# Patient Record
Sex: Male | Born: 1980 | Race: White | Hispanic: No | Marital: Married | Smoking: Never smoker
Health system: Southern US, Community
[De-identification: ages and names within clinical notes are randomized; demographics above are authoritative.]

## PROBLEM LIST (undated history)

## (undated) DIAGNOSIS — E559 Vitamin D deficiency, unspecified: Secondary | ICD-10-CM

## (undated) DIAGNOSIS — M2550 Pain in unspecified joint: Secondary | ICD-10-CM

## (undated) DIAGNOSIS — G4730 Sleep apnea, unspecified: Secondary | ICD-10-CM

## (undated) DIAGNOSIS — R5383 Other fatigue: Secondary | ICD-10-CM

## (undated) DIAGNOSIS — J45909 Unspecified asthma, uncomplicated: Secondary | ICD-10-CM

## (undated) DIAGNOSIS — R7303 Prediabetes: Secondary | ICD-10-CM

## (undated) DIAGNOSIS — F909 Attention-deficit hyperactivity disorder, unspecified type: Secondary | ICD-10-CM

## (undated) DIAGNOSIS — R12 Heartburn: Secondary | ICD-10-CM

## (undated) DIAGNOSIS — E739 Lactose intolerance, unspecified: Secondary | ICD-10-CM

## (undated) DIAGNOSIS — K219 Gastro-esophageal reflux disease without esophagitis: Secondary | ICD-10-CM

## (undated) DIAGNOSIS — I10 Essential (primary) hypertension: Secondary | ICD-10-CM

## (undated) DIAGNOSIS — M549 Dorsalgia, unspecified: Secondary | ICD-10-CM

## (undated) DIAGNOSIS — E663 Overweight: Secondary | ICD-10-CM

## (undated) DIAGNOSIS — F32A Depression, unspecified: Secondary | ICD-10-CM

## (undated) HISTORY — DX: Lactose intolerance, unspecified: E73.9

## (undated) HISTORY — DX: Pain in unspecified joint: M25.50

## (undated) HISTORY — DX: Other fatigue: R53.83

## (undated) HISTORY — DX: Unspecified asthma, uncomplicated: J45.909

## (undated) HISTORY — DX: Sleep apnea, unspecified: G47.30

## (undated) HISTORY — DX: Overweight: E66.3

## (undated) HISTORY — DX: Dorsalgia, unspecified: M54.9

## (undated) HISTORY — DX: Heartburn: R12

## (undated) HISTORY — DX: Attention-deficit hyperactivity disorder, unspecified type: F90.9

## (undated) HISTORY — PX: PILONIDAL CYST DRAINAGE: SHX743

## (undated) HISTORY — PX: WISDOM TOOTH EXTRACTION: SHX21

## (undated) HISTORY — DX: Vitamin D deficiency, unspecified: E55.9

## (undated) HISTORY — PX: FOREHEAD RECONSTRUCTION: SHX429

## (undated) HISTORY — DX: Depression, unspecified: F32.A

---
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
## 2007-02-03 ENCOUNTER — Emergency Department (HOSPITAL_COMMUNITY): Admission: EM | Admit: 2007-02-03 | Discharge: 2007-02-03 | Payer: Self-pay | Admitting: Emergency Medicine

## 2007-07-28 ENCOUNTER — Ambulatory Visit: Payer: Self-pay | Admitting: Internal Medicine

## 2007-07-28 DIAGNOSIS — I10 Essential (primary) hypertension: Secondary | ICD-10-CM | POA: Insufficient documentation

## 2007-08-01 ENCOUNTER — Telehealth: Payer: Self-pay | Admitting: Internal Medicine

## 2007-08-02 LAB — CONVERTED CEMR LAB
BUN: 10 mg/dL (ref 6–23)
CO2: 29 meq/L (ref 19–32)
Calcium: 9.3 mg/dL (ref 8.4–10.5)
Chloride: 108 meq/L (ref 96–112)
Cholesterol: 139 mg/dL (ref 0–200)
Creatinine, Ser: 0.9 mg/dL (ref 0.4–1.5)
Direct LDL: 85.4 mg/dL
GFR calc Af Amer: 131 mL/min
GFR calc non Af Amer: 108 mL/min
Glucose, Bld: 94 mg/dL (ref 70–99)
HDL: 20.9 mg/dL — ABNORMAL LOW (ref >39.0)
Potassium: 4 meq/L (ref 3.5–5.1)
Sodium: 137 meq/L (ref 135–145)
Total CHOL/HDL Ratio: 6.7
Triglycerides: 262 mg/dL (ref 0–149)
VLDL: 52 mg/dL — ABNORMAL HIGH (ref 0–40)

## 2007-08-05 ENCOUNTER — Ambulatory Visit: Payer: Self-pay | Admitting: Family Medicine

## 2007-08-05 ENCOUNTER — Telehealth: Payer: Self-pay | Admitting: Family Medicine

## 2007-08-23 ENCOUNTER — Encounter: Payer: Self-pay | Admitting: Internal Medicine

## 2007-08-25 ENCOUNTER — Ambulatory Visit: Payer: Self-pay | Admitting: Internal Medicine

## 2007-08-29 ENCOUNTER — Ambulatory Visit: Payer: Self-pay | Admitting: Internal Medicine

## 2007-08-29 DIAGNOSIS — F900 Attention-deficit hyperactivity disorder, predominantly inattentive type: Secondary | ICD-10-CM | POA: Insufficient documentation

## 2007-08-29 DIAGNOSIS — F909 Attention-deficit hyperactivity disorder, unspecified type: Secondary | ICD-10-CM | POA: Insufficient documentation

## 2007-10-06 ENCOUNTER — Ambulatory Visit: Payer: Self-pay | Admitting: Internal Medicine

## 2007-10-24 ENCOUNTER — Telehealth: Payer: Self-pay | Admitting: Internal Medicine

## 2008-01-11 ENCOUNTER — Ambulatory Visit: Payer: Self-pay | Admitting: Internal Medicine

## 2008-01-11 DIAGNOSIS — K219 Gastro-esophageal reflux disease without esophagitis: Secondary | ICD-10-CM | POA: Insufficient documentation

## 2008-04-10 ENCOUNTER — Ambulatory Visit: Payer: Self-pay | Admitting: Internal Medicine

## 2008-06-18 ENCOUNTER — Telehealth: Payer: Self-pay | Admitting: Internal Medicine

## 2008-08-21 ENCOUNTER — Ambulatory Visit: Payer: Self-pay | Admitting: Internal Medicine

## 2008-08-28 ENCOUNTER — Telehealth: Payer: Self-pay | Admitting: Internal Medicine

## 2008-10-02 ENCOUNTER — Ambulatory Visit: Payer: Self-pay | Admitting: Internal Medicine

## 2008-10-02 LAB — CONVERTED CEMR LAB
ALT: 26 units/L (ref 0–53)
AST: 21 units/L (ref 0–37)
Albumin: 4.2 g/dL (ref 3.5–5.2)
Alkaline Phosphatase: 58 units/L (ref 39–117)
BUN: 12 mg/dL (ref 6–23)
Basophils Absolute: 0 10*3/uL (ref 0.0–0.1)
Basophils Relative: 0.5 % (ref 0.0–3.0)
Bilirubin Urine: NEGATIVE
Bilirubin, Direct: 0 mg/dL (ref 0.0–0.3)
Blood in Urine, dipstick: NEGATIVE
CO2: 30 meq/L (ref 19–32)
Calcium: 9.1 mg/dL (ref 8.4–10.5)
Chloride: 108 meq/L (ref 96–112)
Cholesterol: 150 mg/dL (ref 0–200)
Creatinine, Ser: 0.8 mg/dL (ref 0.4–1.5)
Eosinophils Absolute: 0.1 10*3/uL (ref 0.0–0.7)
Eosinophils Relative: 1.3 % (ref 0.0–5.0)
GFR calc non Af Amer: 123.08 mL/min (ref >60)
Glucose, Bld: 86 mg/dL (ref 70–99)
Glucose, Urine, Semiquant: NEGATIVE
HCT: 43 % (ref 39.0–52.0)
HDL: 27 mg/dL — ABNORMAL LOW (ref >39.00)
Hemoglobin: 15.1 g/dL (ref 13.0–17.0)
Ketones, urine, test strip: NEGATIVE
LDL Cholesterol: 92 mg/dL (ref 0–99)
Lymphocytes Relative: 37 % (ref 12.0–46.0)
Lymphs Abs: 2.1 10*3/uL (ref 0.7–4.0)
MCHC: 35.1 g/dL (ref 30.0–36.0)
MCV: 88.4 fL (ref 78.0–100.0)
Monocytes Absolute: 0.5 10*3/uL (ref 0.1–1.0)
Monocytes Relative: 8.8 % (ref 3.0–12.0)
Neutro Abs: 3.1 10*3/uL (ref 1.4–7.7)
Neutrophils Relative %: 52.4 % (ref 43.0–77.0)
Nitrite: NEGATIVE
Platelets: 218 10*3/uL (ref 150.0–400.0)
Potassium: 4.1 meq/L (ref 3.5–5.1)
Protein, U semiquant: NEGATIVE
RBC: 4.86 M/uL (ref 4.22–5.81)
RDW: 11.6 % (ref 11.5–14.6)
Sodium: 142 meq/L (ref 135–145)
Specific Gravity, Urine: 1.015
TSH: 0.59 microintl units/mL (ref 0.35–5.50)
Total Bilirubin: 1.1 mg/dL (ref 0.3–1.2)
Total CHOL/HDL Ratio: 6
Total Protein: 7.2 g/dL (ref 6.0–8.3)
Triglycerides: 153 mg/dL — ABNORMAL HIGH (ref 0.0–149.0)
Urobilinogen, UA: 0.2
VLDL: 30.6 mg/dL (ref 0.0–40.0)
WBC Urine, dipstick: NEGATIVE
WBC: 5.8 10*3/uL (ref 4.5–10.5)
pH: 7.5

## 2008-10-14 ENCOUNTER — Ambulatory Visit: Payer: Self-pay | Admitting: Internal Medicine

## 2008-11-27 ENCOUNTER — Telehealth: Payer: Self-pay | Admitting: Internal Medicine

## 2008-12-16 ENCOUNTER — Ambulatory Visit: Payer: Self-pay | Admitting: Occupational Medicine

## 2009-04-10 ENCOUNTER — Emergency Department (HOSPITAL_BASED_OUTPATIENT_CLINIC_OR_DEPARTMENT_OTHER): Admission: EM | Admit: 2009-04-10 | Discharge: 2009-04-10 | Payer: Self-pay | Admitting: Emergency Medicine

## 2009-04-15 ENCOUNTER — Encounter: Admission: RE | Admit: 2009-04-15 | Discharge: 2009-05-21 | Payer: Self-pay | Admitting: Orthopedic Surgery

## 2009-11-14 ENCOUNTER — Ambulatory Visit: Payer: Self-pay | Admitting: Internal Medicine

## 2009-11-25 ENCOUNTER — Encounter: Payer: Self-pay | Admitting: Internal Medicine

## 2010-01-01 ENCOUNTER — Encounter: Payer: Self-pay | Admitting: Internal Medicine

## 2010-01-21 ENCOUNTER — Encounter: Payer: Self-pay | Admitting: Internal Medicine

## 2010-04-07 NOTE — Assessment & Plan Note (Signed)
Summary: 3 MONTH ROV/NJRreschedule with patient from bump/mhf   Vital Signs:  Patient Profile:   30 Years Old Male Height:     71.25 inches Weight:      273 pounds Temp:     98.2 degrees F Pulse rate:   58 / minute BP sitting:   108 / 76  (left arm)  Vitals Entered By: Gladis Riffle, RN (January 11, 2008 8:53 AM)                 Chief Complaint:  3 month rov--states strattera working most days.  History of Present Illness: ADD-doing well on meds  HTN---tolerating meds without difficulty  GERD---doing well on ppi  Past Medical History: Hypertension (dx 2009)  Past Surgical History: pilonidal cyst removal of metal fragment from right temple mole removal Social History: Occupation: EMT Single-engaged Current Smoker Alcohol use-yes Regular exercise-no  Family History: Family History Hypertension---father mother- alive and well  no other complaints in a complete ROS     Prior Medication List:  LISINOPRIL 10 MG  TABS (LISINOPRIL) Take 1 tab by mouth daily OMEPRAZOLE 20 MG CPDR (OMEPRAZOLE) one by mouth daily STRATTERA 80 MG  CAPS (ATOMOXETINE HCL) once daily tab   Current Allergies (reviewed today): No known allergies   Past Medical History:    Hypertension (dx 2009)    ADHD    GERD     Review of Systems       no other complaints in a complete ROS    Physical Exam  General:     Well-developed,well-nourished,in no acute distress; alert,appropriate and cooperative throughout examination Head:     normocephalic and atraumatic.   Eyes:     pupils equal and pupils round.   Ears:     R ear normal and L ear normal.   Neck:     No deformities, masses, or tenderness noted. Chest Wall:     No deformities, masses, tenderness or gynecomastia noted. Lungs:     Normal respiratory effort, chest expands symmetrically. Lungs are clear to auscultation, no crackles or wheezes. Heart:     Normal rate and regular rhythm. S1 and S2 normal without gallop, murmur,  click, rub or other extra sounds. Abdomen:     Bowel sounds positive,abdomen soft and non-tender without masses, organomegaly or hernias noted. Msk:     No deformity or scoliosis noted of thoracic or lumbar spine.   Pulses:     R radial normal and L radial normal.      Impression & Recommendations:  Problem # 1:  HYPERTENSION (ICD-401.9) Assessment: Improved  His updated medication list for this problem includes:    Lisinopril 10 Mg Tabs (Lisinopril) .[name]... Take 1 tab by mouth daily  BP today: 108/76 Prior BP: 150/94 (10/06/2007)  Labs Reviewed: Creat: 0.9 (07/28/2007) Chol: 139 (07/28/2007)   HDL: 20.9 (07/28/2007)   LDL: 85.4 (07/28/2007)   TG: 262 (07/28/2007)   Problem # 2:  ADHD (ICD-314.01) continue meds  Problem # 3:  GERD (ICD-530.81)  His updated medication list for this problem includes:    Omeprazole 20 Mg Cpdr (Omeprazole) ..... One by mouth daily   Complete Medication List: 1)  Lisinopril 10 Mg Tabs (Lisinopril) .... Take 1 tab by mouth daily 2)  Omeprazole 20 Mg Cpdr (Omeprazole) .... One by mouth daily 3)  Strattera 100 Mg Caps (Atomoxetine hcl) .... Take 1 tablet by mouth once a day   Patient Instructions: 1)  Please schedule a follow-up appointment in 3  months.   Prescriptions: STRATTERA 100 MG CAPS (ATOMOXETINE HCL) Take 1 tablet by mouth once a day  #30 x 6   Entered and Authorized by:   Birdie Sons MD   Signed by:   Birdie Sons MD on 01/11/2008   Method used:   Print then Give to Patient   RxID:   [id number]  ]

## 2010-04-07 NOTE — Assessment & Plan Note (Signed)
Summary: poison ivy rash/tsc   Vital Signs:  Patient Profile:   30 Years Old Male Height:     71.25 inches Weight:      284 pounds Temp:     97.4 degrees F oral Pulse rate:   61 / minute BP sitting:   120 / 77  (left arm) Cuff size:   large  Vitals Entered By: Liane Comber (Aug 05, 2007 11:37 AM)                 Chief Complaint:  poison ivy rash and pain.  Acute Visit History:      The patient complains of rash.  The rash began 5 days ago.  It is pruritic.  It is described as vessicular and is located on the legs.  Comments: went after frisbee in woods, he is very allergic to pioison ivy.           Current Allergies (reviewed today): No known allergies   Past Medical History:    Reviewed history from 07/28/2007 and no changes required:       Hypertension (dx 2009)   Social History:    Reviewed history from 07/28/2007 and no changes required:       Occupation: EMT       Single-engaged       Current Smoker       Alcohol use-yes       Regular exercise-no    Review of Systems      See HPI  General      interested in weight loss, exercising regularly, watching fat intake  Resp      Denies shortness of breath.      no tounge swelling   Physical Exam  General:     overweight-appearing, NADoverweight-appearing.   Lungs:     Normal respiratory effort, chest expands symmetrically. Lungs are clear to auscultation, no crackles or wheezes. Heart:     Normal rate and regular rhythm. S1 and S2 normal without gallop, murmur, click, rub or other extra sounds. Skin:     weeping, clear yellow fluid, no pus, large vesicles and erythematous plaque B legs    Impression & Recommendations:  Problem # 1:  CONTACT DERMATITIS&OTHER ECZEMA DUE TO PLANTS (ICD-692.6)  His updated medication list for this problem includes:    Prednisone 20 Mg Tabs (Prednisone) .Marland KitchenMarland KitchenMarland KitchenMarland Kitchen 3 tabs by mouth daily x 3 days, then 2 tabs by mouth daily x 2 days then 1 tab by mouth daily x 2  days Discussed avoidance of triggers and symptomatic treatment.  Orders: Depo- Medrol 40mg  (J1030) Admin of Therapeutic Inj  intramuscular or subcutaneous (51761)   Complete Medication List: 1)  Lisinopril 10 Mg Tabs (Lisinopril) .... Take 1 tab by mouth daily 2)  Omeprazole 20 Mg Cpdr (Omeprazole) .... One by mouth daily 3)  Prednisone 20 Mg Tabs (Prednisone) .... 3 tabs by mouth daily x 3 days, then 2 tabs by mouth daily x 2 days then 1 tab by mouth daily x 2 days   Patient Instructions: 1)  May try ALlI for weight loss. Avoid stimulant weight loss meds given high blood pressure.   Prescriptions: PREDNISONE 20 MG  TABS (PREDNISONE) 3 tabs by mouth daily x 3 days, then 2 tabs by mouth daily x 2 days then 1 tab by mouth daily x 2 days  #15 x 0   Entered and Authorized by:   Kerby Nora MD   Signed by:   Kerby Nora MD on 08/05/2007  Method used:   Print then Give to Patient   RxID:   [id number]  ]    Medication Administration  Injection # 1:    Medication: Depo- Medrol 40mg     Diagnosis: CONTACT DERMATITIS&OTHER ECZEMA DUE TO PLANTS (ICD-692.6)    Route: IM    Site: R deltoid    Exp Date: 03/07/2008    Lot #: [id number]    Mfr: Sicor    Comments: IM x 1     Patient tolerated injection without complications    Given by: Liane Comber (Aug 05, 2007 12:05 PM)  Orders Added: 1)  Est. Patient Level III [99213] 2)  Depo- Medrol 40mg  [J1030] 3)  Admin of Therapeutic Inj  intramuscular or subcutaneous [96372]   Medication Administration  Injection # 1:    Medication: Depo- Medrol 40mg     Diagnosis: CONTACT DERMATITIS&OTHER ECZEMA DUE TO PLANTS (ICD-692.6)    Route: IM    Site: R deltoid    Exp Date: 03/07/2008    Lot #: [id number]    Mfr: Sicor    Comments: IM x 1     Patient tolerated injection without complications    Given by: Liane Comber (Aug 05, 2007 12:05 PM)  Orders Added: 1)  Est. Patient Level III [84696] 2)  Depo- Medrol 40mg  [J1030] 3)   Admin of Therapeutic Inj  intramuscular or subcutaneous [[id number]]

## 2010-04-07 NOTE — Progress Notes (Signed)
Summary: increase Strattera  Phone Note Call from Patient   Caller: Patient Call For: Birdie Sons MD Summary of Call: Pt would like to increase his Strattera dose due to difficulty focusing. [phone] Initial call taken by: Lynann Beaver CMA,  November 27, 2008 11:41 AM  Follow-up for Phone Call        he is already on max or near maximum dose Follow-up by: Birdie Sons MD,  November 27, 2008 11:57 AM  Additional Follow-up for Phone Call Additional follow up Details #1::        Patient informed & said okay. Additional Follow-up by: Rudy Jew, RN,  November 27, 2008 2:07 PM

## 2010-04-07 NOTE — Progress Notes (Signed)
Summary: REFILL--lisinopril,omeprazole, strattera  Phone Note Refill Request Message from:  Patient  Refills Requested: Medication #1:  OMEPRAZOLE 20 MG CPDR one by mouth daily  Medication #2:  STRATTERA 60 MG CAPS 2 by mouth once daily.  Medication #3:  LISINOPRIL 10 MG  TABS Take 1 tab by mouth daily [facility]   Initial call taken by: Roselle Locus,  August 28, 2008 4:50 PM  Follow-up for Phone Call        Rx done electronically to [facility] [address].  Pt aware. Follow-up by: Gladis Riffle, RN,  August 29, 2008 12:13 PM      Prescriptions: STRATTERA 60 MG CAPS (ATOMOXETINE HCL) 2 by mouth once daily  #120 x 3   Entered by:   Gladis Riffle, RN   Authorized by:   Birdie Sons MD   Signed by:   Gladis Riffle, RN on 08/29/2008   Method used:   Electronically to        [facility] [id number] (retail)       [address] [place], [place]  [address]       Ph: [phone]       Fax: [phone]   RxID:   [id number]

## 2010-04-07 NOTE — Medication Information (Signed)
Summary: Rx for [employer]  Rx for Strattera/[employer]   Imported By: Maryln Gottron 01/23/2010 10:33:52  _____________________________________________________________________  External Attachment:    Type:   Image     Comment:   External Document

## 2010-04-07 NOTE — Progress Notes (Signed)
Summary: Prostate pain questions  Phone Note Call from Patient Call back at Home Phone [phone]   Caller: Patient Call For: Swords Summary of Call: Pt started on Strattera 80 mg on 6/23.  He had a 1 month F/U and things were going well.  Pt concerned now about prostatitis recently following a BM on Sunday.  Shortly after BM he experienced intense pain in his prostate area.  It was only relieved with sitting or lying down, standing the pain was ongoing.  Monday he had a BM, no pain following.  Following Tuesday BM and the pain occured again same place and he had to sit/lie down for sx to resolve, usually within the hour. Could this be a result of the new med?  Another OV? [facility] [address] Initial call taken by: Sid Falcon LPN,  October 24, 2007 11:44 AM  Follow-up for Phone Call        unlikely to be new medication schedule OV --for this problem ONLY Follow-up by: Birdie Sons MD,  October 24, 2007 2:29 PM  Additional Follow-up for Phone Call Additional follow up Details #1::        Pt informed, he will call back for OV when he has his schedule with him. Additional Follow-up by: Sid Falcon LPN,  October 24, 2007 3:31 PM

## 2010-04-07 NOTE — Medication Information (Signed)
Summary: Rx for [employer] Patient Assistance program  Rx for Strattera/[employer] Patient Assistance program   Imported By: Maryln Gottron 01/05/2010 13:00:27  _____________________________________________________________________  External Attachment:    Type:   Image     Comment:   External Document

## 2010-04-07 NOTE — Assessment & Plan Note (Signed)
Summary: to discuss treatment for ADD   Vital Signs:  Patient Profile:   30 Years Old Male Height:     71.25 inches Weight:      284 pounds Temp:     98.4 degrees F oral BP sitting:   140 / 80  (left arm) Cuff size:   regular  Vitals Entered By: Sid Falcon LPN (August 29, 2007 4:14 PM)                 Chief Complaint:  To discuss ADD.  History of Present Illness: hx ADD brings in records from psychologist with documentation of presumed ADD    Current Allergies (reviewed today): No known allergies         Impression & Recommendations:  Problem # 1:  ADHD (ICD-314.01) discussed meds at length discussed diagnosis at length. Reviewed documentation. Trial Strattera. Gradually increase dose. See instructions. Total time 20 minutes   Complete Medication List: 1)  Lisinopril 10 Mg Tabs (Lisinopril) .... Take 1 tab by mouth daily 2)  Omeprazole 20 Mg Cpdr (Omeprazole) .... One by mouth daily 3)  Strattera 80 Mg Caps (Atomoxetine hcl) .... Once daily tab   Patient Instructions: 1)  straterra 40 mg by mouth once daily for 5 days and then 2 by mouth once daily ---then start 80 mg   Prescriptions: STRATTERA 80 MG  CAPS (ATOMOXETINE HCL) once daily tab  #30 x 11   Entered and Authorized by:   Birdie Sons MD   Signed by:   Birdie Sons MD on 08/29/2007   Method used:   Print then Give to Patient   RxID:   [id number]  ]

## 2010-04-07 NOTE — Assessment & Plan Note (Signed)
Summary: cpx/njr rsc bmp/njr   Vital Signs:  Patient profile:   30 year old male Height:      73.5 inches Weight:      282 pounds BMI:     36.83 Pulse rate:   68 / minute Resp:     12 per minute BP sitting:   116 / 78  (left arm)  Vitals Entered By: Gladis Riffle, RN (October 14, 2008 9:16 AM)  Nutrition Counseling: Patient's BMI is greater than 25 and therefore counseled on weight management options.  History of Present Illness: cpx  Current Problems (verified): 1)  Preventive Health Care  (ICD-V70.0) 2)  Folliculitis  (ICD-704.8) 3)  Gerd  (ICD-530.81) 4)  Special Screening For Malignant Neoplasms Colon  (ICD-V76.51) 5)  Adhd  (ICD-314.01) 6)  Hypertension  (ICD-401.9)  Current Medications (verified): 1)  Lisinopril 10 Mg  Tabs (Lisinopril) .... Take 1 Tab By Mouth Daily 2)  Omeprazole 20 Mg Cpdr (Omeprazole) .... One By Mouth Daily 3)  Strattera 60 Mg Caps (Atomoxetine Hcl) .... 2 By Mouth Once Daily  Allergies: No Known Drug Allergies  Comments:  Nurse/Medical Assistant: cpx, labs done--c/o seasonal sinus pressure and "gall bladder issues"  The patient's medications and allergies were reviewed with the patient and were updated in the Medication and Allergy Lists. Gladis Riffle, RN (October 14, 2008 9:16 AM)  Social History: Occupation: EMT Current Smoker--cigar---none consistently Alcohol use-yes Regular exercise-no Married  Physical Exam  General:  alert and well-developed.   Head:  normocephalic and atraumatic.   Eyes:  pupils equal and pupils round.   Ears:  R ear normal and L ear normal.   Nose:  no external deformity and no external erythema.   Neck:  No deformities, masses, or tenderness noted. Chest Wall:  No deformities, masses, tenderness or gynecomastia noted. Lungs:  normal respiratory effort and no intercostal retractions.   Heart:  normal rate, regular rhythm, no gallop, and no rub.   Abdomen:  Bowel sounds positive,abdomen soft and non-tender  without masses, organomegaly or hernias noted.  overweight Prostate:  Prostate gland firm and smooth, no enlargement, nodularity, tenderness, mass, asymmetry or induration. Msk:  No deformity or scoliosis noted of thoracic or lumbar spine.   Pulses:  R radial normal and L radial normal.   Neurologic:  cranial nerves II-XII intact and gait normal.   Skin:  turgor normal and color normal.  tatoo left thigh Cervical Nodes:  no anterior cervical adenopathy and no posterior cervical adenopathy.   Psych:  normally interactive and good eye contact.     Impression & Recommendations:  Problem # 1:  Preventive Health Care (ICD-V70.0) helath maint utd neds to lose weight initial goal should be under 225 pounds  Problem # 2:  HYPERTENSION (ICD-401.9) well controlled continue current medications  His updated medication list for this problem includes:    Lisinopril 10 Mg Tabs (Lisinopril) .[name]... Take 1 tab by mouth daily  BP today: 116/78 Prior BP: 112/86 (08/21/2008)  Labs Reviewed: K+: 4.1 (10/02/2008) Creat: : 0.8 (10/02/2008)   Chol: 150 (10/02/2008)   HDL: 27.00 (10/02/2008)   LDL: 92 (10/02/2008)   TG: 153.0 (10/02/2008)  Complete Medication List: 1)  Lisinopril 10 Mg Tabs (Lisinopril) .... Take 1 tab by mouth daily 2)  Omeprazole 20 Mg Cpdr (Omeprazole) .... One by mouth daily 3)  Strattera 60 Mg Caps (Atomoxetine hcl) .... 2 by mouth once daily   Patient Instructions: 1)  monitor BP---goal less than 130/85

## 2010-04-07 NOTE — Assessment & Plan Note (Signed)
Summary: 1 month rov/njr   Vital Signs:  Patient Profile:   30 Years Old Male Height:     71.25 inches Weight:      271 pounds Temp:     98.0 degrees F oral Pulse rate:   64 / minute Pulse rhythm:   regular BP sitting:   150 / 94  (left arm) Cuff size:   regular  Vitals Entered By: Kern Reap CMA (October 06, 2007 10:15 AM)                 Serial Vital Signs/Assessments:  Time      Position  BP       Pulse  Resp  Temp     By                     130/84                         Birdie Sons MD   Chief Complaint:  one month follow up with meds.  History of Present Illness: ADD-started strattera---says attention much better. He wonders whether meds causing increase sweating, also feels than fine motor control of mouth is somewhat diminished in the morning.   Past Medical History: Hypertension (dx 2009)  Past Surgical History: pilonidal cyst removal of metal fragment from right temple mole removal Social History: Occupation: EMT Single-engaged Current Smoker Alcohol use-yes Regular exercise-no  Family History: Family History Hypertension---father mother- alive and well   no other complaints in a complete ROS   Acute Visit History:      The patient complains of sore throat.  He denies fever.     Prior Medications Reviewed Using: Patient Recall  Prior Medication List:  LISINOPRIL 10 MG  TABS (LISINOPRIL) Take 1 tab by mouth daily OMEPRAZOLE 20 MG CPDR (OMEPRAZOLE) one by mouth daily STRATTERA 80 MG  CAPS (ATOMOXETINE HCL) once daily tab   Current Allergies: No known allergies       Physical Exam  General:     Well-developed,well-nourished,in no acute distress; alert,appropriate and cooperative throughout examination Head:     normocephalic and atraumatic.   Eyes:     pupils equal and pupils round.   Ears:     R ear normal and L ear normal.   Neck:     No deformities, masses, or tenderness noted. Chest Wall:     No deformities, masses,  tenderness or gynecomastia noted. Lungs:     Normal respiratory effort, chest expands symmetrically. Lungs are clear to auscultation, no crackles or wheezes. Heart:     Normal rate and regular rhythm. S1 and S2 normal without gallop, murmur, click, rub or other extra sounds.    Impression & Recommendations:  Problem # 1:  ADHD (ICD-314.01) seems much better continue meds total time 15 minutes.     Complete Medication List: 1)  Lisinopril 10 Mg Tabs (Lisinopril) .... Take 1 tab by mouth daily 2)  Omeprazole 20 Mg Cpdr (Omeprazole) .... One by mouth daily 3)  Strattera 80 Mg Caps (Atomoxetine hcl) .... Once daily tab   Patient Instructions: 1)  Please schedule a follow-up appointment in 3 months.   ]

## 2010-04-07 NOTE — Assessment & Plan Note (Signed)
Summary: skin infecion?/dm   Vital Signs:  Patient profile:   30 year old male Weight:      282 pounds Temp:     97.8 degrees F Pulse rate:   74 / minute Pulse rhythm:   regular Resp:     12 per minute BP sitting:   112 / 86  (left arm)  Vitals Entered By: Gladis Riffle, RN (August 21, 2008 10:04 AM)  History of Present Illness: has had several bumps on face---some of these "bumps" have come to a head, seem to act "like a zit". no fever or chills , no sweats,  sxs seem to be more noticeable for 10 days.     Allergies: No Known Drug Allergies  Comments:  Nurse/Medical Assistant: c/o ingrown hairs at beard area The patient's medications and allergies were reviewed with the patient and were updated in the Medication and Allergy Lists. Gladis Riffle, RN (August 21, 2008 10:05 AM)   Impression & Recommendations:  Problem # 1:  FOLLICULITIS (ICD-704.8) mild facial don't shave for five days mild soap---wash face two times a day  no further evaluation total time 10 minutes > 1/2 counselling  Complete Medication List: 1)  Lisinopril 10 Mg Tabs (Lisinopril) .... Take 1 tab by mouth daily 2)  Omeprazole 20 Mg Cpdr (Omeprazole) .... One by mouth daily 3)  Strattera 60 Mg Caps (Atomoxetine hcl) .... 2 by mouth once daily

## 2010-04-07 NOTE — Assessment & Plan Note (Signed)
Summary: SINUS & COUGH/KH   Vital Signs:  Patient Profile:   30 Years Old Male CC:      Fever, weakness, body aches, sore throat x3 days Height:     73.5 inches Weight:      282 pounds O2 Sat:      99 % O2 treatment:    Room Air Temp:     98.8 degrees F oral Pulse rate:   95 / minute Pulse rhythm:   regular Resp:     16 per minute BP sitting:   144 / 92  (right arm) Cuff size:   large  Vitals Entered By: Emilio Math (December 16, 2008 11:37 AM)                  Current Allergies: ! * SHELLFISHHistory of Present Illness Chief Complaint: Fever, weakness, body aches, sore throat x3 days History of Present Illness: Complains of 3 day history of sore throat, fever, generalized body aches.   Also  complains of post nasal drip.  No cough.   Some ear pain.  No abdominal pain.   Also complains of headache.   No rash.   He has not been vaccinated against influenza.  No history of sick contacts.   Current Meds LISINOPRIL 10 MG  TABS (LISINOPRIL) Take 1 tab by mouth daily OMEPRAZOLE 20 MG CPDR (OMEPRAZOLE) one by mouth daily STRATTERA 60 MG CAPS (ATOMOXETINE HCL) 2 by mouth once daily  REVIEW OF SYSTEMS Constitutional Symptoms       Complains of fever, chills, and night sweats.     Denies weight loss, weight gain, and fatigue.  Eyes       Denies change in vision, eye pain, eye discharge, glasses, contact lenses, and eye surgery. Ear/Nose/Throat/Mouth       Complains of sinus problems, sore throat, and hoarseness.      Denies hearing loss/aids, change in hearing, ear pain, ear discharge, dizziness, frequent runny nose, frequent nose bleeds, and tooth pain or bleeding.  Respiratory       Denies dry cough, productive cough, wheezing, shortness of breath, asthma, bronchitis, and emphysema/COPD.  Cardiovascular       Denies murmurs, chest pain, and tires easily with exhertion.    Gastrointestinal       Denies stomach pain, nausea/vomiting, diarrhea, constipation, blood in bowel  movements, and indigestion. Genitourniary       Denies painful urination, kidney stones, and loss of urinary control. Neurological       Complains of weakness.      Denies headaches, loss of or changes in sensation, numbness, tngling, tremors, paralysis, seizures, and fainting/blackouts. Musculoskeletal       Complains of muscle pain and joint pain.      Denies joint stiffness, decreased range of motion, redness, swelling, muscle weakness, and gout.  Skin       Denies bruising, unusual mles/lumps or sores, and hair/skin or nail changes.  Psych       Denies mood changes, temper/anger issues, anxiety/stress, speech problems, depression, and sleep problems.  Past History:  Past Medical History: Reviewed history from 01/11/2008 and no changes required. Hypertension (dx 2009) ADHD GERD  Past Surgical History: Reviewed history from 07/28/2007 and no changes required. pilonidal cyst removal of metal fragment from right temple mole removal  Family History: Reviewed history from 07/28/2007 and no changes required. Family History Hypertension---father mother- alive and well   Social History: Reviewed history from 10/14/2008 and no changes required. Occupation: EMT Current Smoker--cigar---none  consistently Alcohol use-yes Regular exercise-no Married Physical Exam General appearance: well developed, well nourished, no acute distress Head: normocephalic, atraumatic Oral/Pharynx: tongue normal, posterior pharynx without erythema or exudate Neck: neck supple,  trachea midline, no masses Chest/Lungs: no rales, wheezes, or rhonchi bilateral, breath sounds equal without effort Heart: regular rate and  rhythm, no murmur Assessment New Problems: INFLUENZA (ICD-487.8)   Plan New Orders: Est. Patient Level III [99213] Planning Comments:   Drink plenty of fluids and get lots of rest Ibuprofen OTC as needed No inidications for antivirals at this point Follow up as needed Work note  given.   The patient and/or caregiver has been counseled thoroughly with regard to medications prescribed including dosage, schedule, interactions, rationale for use, and possible side effects and they verbalize understanding.  Diagnoses and expected course of recovery discussed and will return if not improved as expected or if the condition worsens. Patient and/or caregiver verbalized understanding.

## 2010-04-07 NOTE — Progress Notes (Signed)
Summary: sat clinic/ poison ivy  Phone Note Call from Patient Call back at Home Phone [phone]   Caller: Patient Call For: bedsole Summary of Call: Pt c/o poison ivy, appt made for pt Initial call taken by: Liane Comber,  Aug 05, 2007 10:54 AM

## 2010-04-07 NOTE — Assessment & Plan Note (Signed)
Summary: new pt ov/ccm   Vital Signs:  Patient Profile:   30 Years Old Male Height:     71.25 inches Weight:      291 pounds Pulse rate:   72 / minute BP sitting:   138 / 84  (left arm)  Vitals Entered By: Gladis Riffle, RN (Jul 28, 2007 2:15 PM)                 Chief Complaint:  to establish, needs PCP--BP 140-170/80-96, both parents with med for HTN--also c/o throat swelling, and wants to discuss.  History of Present Illness: concerned with HTN--fhx of htn. Home BPs 130-170/70-98. no headache, neurolgic deficit or other associated sxs. Ongoing for 2-4 months.  feels like he has a lump in throat---seems to feel it when he swallows. Told he has allergic rhinitis and reflux. prescribed inhaler, nasonex, ppi---seems to have improved.       Current Allergies: No known allergies   Past Medical History:    Hypertension (dx 2009)  Past Surgical History:    Reviewed history and no changes required:       pilonidal cyst       removal of metal fragment from right temple       mole removal   Family History:    Family History Hypertension---father    mother- alive and well   Social History:    Occupation: EMT    Single-engaged    Current Smoker    Alcohol use-yes    Regular exercise-no   Risk Factors:  Tobacco use:  current    Cigars:  Yes -- occ per week Alcohol use:  yes Exercise:  no   Review of Systems       no other complaints in a complete ROS    Physical Exam  General:     Well-developed,well-nourished,in no acute distress; alert,appropriate and cooperative throughout examination Head:     normocephalic and atraumatic.   Eyes:     pupils equal and pupils round.   Ears:     R ear normal and L ear normal.   Nose:     no external deformity and no external erythema.   Neck:     No deformities, masses, or tenderness noted. Chest Wall:     No deformities, masses, tenderness or gynecomastia noted. Lungs:     Normal respiratory effort, chest  expands symmetrically. Lungs are clear to auscultation, no crackles or wheezes. Heart:     normal rate, regular rhythm, no murmur, and no gallop.   Abdomen:     Bowel sounds positive,abdomen soft and non-tender without masses, organomegaly or hernias noted. Pulses:     R radial normal and L radial normal.   Extremities:     No clubbing, cyanosis, edema, or deformity noted  Neurologic:     cranial nerves II-XII intact and gait normal.   Cervical Nodes:     no anterior cervical adenopathy and no posterior cervical adenopathy.   Psych:     normally interactive and good eye contact.      Impression & Recommendations:  Problem # 1:  HYPERTENSION (ICD-401.9) all side effects discussed His updated medication list for this problem includes:    Lisinopril 10 Mg Tabs (Lisinopril) .[name]... Take 1 tab by mouth daily  Orders: EKG w/ Interpretation (93000) Venipuncture (14782) TLB-Lipid Panel (80061-LIPID) TLB-BMP (Basic Metabolic Panel-BMET) (80048-METABOL)   Problem # 2:  OTHER DYSPHAGIA (NFA-213.08) unclear etiology GERD vs allergic sxs-triall ppi   Complete Medication  List: 1)  Lisinopril 10 Mg Tabs (Lisinopril) .... Take 1 tab by mouth daily 2)  Omeprazole 20 Mg Cpdr (Omeprazole) .... One by mouth daily   Patient Instructions: 1)  Please schedule a follow-up appointment in 1 month.   Prescriptions: LISINOPRIL 10 MG  TABS (LISINOPRIL) Take 1 tab by mouth daily  #90 x 3   Entered and Authorized by:   Birdie Sons MD   Signed by:   Birdie Sons MD on 07/31/2007   Method used:   Electronically sent to ...       [facility] [id number]*       [address]       [place], [place]  [address]       Ph: [phone]       Fax: [phone]   RxID:   [id number] OMEPRAZOLE 20 MG CPDR (OMEPRAZOLE) one by mouth daily  #30 x 11   Entered and Authorized by:   Birdie Sons MD   Signed by:   Birdie Sons MD on 07/28/2007   Method used:   Electronically sent to ...        [facility] [id number]*       [address]       [place], [place]  [address]       Ph: [phone]       Fax: [phone]   RxID:   [id number] LISINOPRIL 10 MG  TABS (LISINOPRIL) Take 1 tab by mouth daily  #90 x 3   Entered and Authorized by:   Birdie Sons MD   Signed by:   Birdie Sons MD on 07/28/2007   Method used:   Print then Give to Patient   RxID:   [id number]  ]

## 2010-04-07 NOTE — Medication Information (Signed)
Summary: Application for [employer] for [facility]   Imported By: Maryln Gottron 12/16/2009 12:27:14  _____________________________________________________________________  External Attachment:    Type:   Image     Comment:   External Document

## 2010-04-07 NOTE — Letter (Signed)
Summary: Out of School  [facility]  [address]   [place], [place] [address]   Phone: [phone]  Fax: [phone]    December 16, 2008   Student:  Phillip Roach    To Whom It May Concern:   For Medical reasons, please excuse the above named student from school for the following dates:  Start:   December 16, 2008  End:    December 18, 2008  If you need additional information, please feel free to contact our office.   Sincerely,    Kathrine Haddock MD    ****This is a legal document and cannot be tampered with.  Schools are authorized to verify all information and to do so accordingly.

## 2010-04-07 NOTE — Assessment & Plan Note (Signed)
Summary: 6 month rov/njr reschedule with patient from bump/mhf   Vital Signs:  Patient Profile:   30 Years Old Male Height:     71.25 inches Weight:      276 pounds Temp:     97.8 degrees F Pulse rate:   78 / minute Pulse rhythm:   regular                 Chief Complaint:  f/u.  History of Present Illness:  GERD (ICD-530.81)---tolerating meds without difficulty ADHD (ICD-314.01)---doing well on meds---thinks straterra causes some sweating HYPERTENSION (ICD-401.9)--no home bps---doing well on meds---home BPs 110-130/70-90  Past Medical History: Hypertension (dx 2009) ADHD GERD  Past Surgical History: pilonidal cyst removal of metal fragment from right temple mole removal Social History: Occupation: EMT Single-engaged Current Smoker Alcohol use-yes Regular exercise-no  Family History: Family History Hypertension---father mother- alive and well  no other complaints in a complete ROS        Current Allergies: No known allergies       Physical Exam  General:     Well-developed,well-nourished,in no acute distress; alert,appropriate and cooperative throughout examination Head:     normocephalic and atraumatic.   Eyes:     pupils equal and pupils round.   Ears:     R ear normal and L ear normal.   Nose:     no external deformity and no external erythema.   Neck:     No deformities, masses, or tenderness noted. Chest Wall:     No deformities, masses, tenderness or gynecomastia noted. Lungs:     Normal respiratory effort, chest expands symmetrically. Lungs are clear to auscultation, no crackles or wheezes. Heart:     Normal rate and regular rhythm. S1 and S2 normal without gallop, murmur, click, rub or other extra sounds. Abdomen:     Bowel sounds positive,abdomen soft and non-tender without masses, organomegaly or hernias noted.  overweight Msk:     No deformity or scoliosis noted of thoracic or lumbar spine.   Neurologic:     cranial nerves  II-XII intact and gait normal.   Skin:     weeping, clear yellow fluid, no pus, large vesicles and erythematous plaque B legs Psych:     normally interactive and good eye contact.      Impression & Recommendations:  Problem # 1:  GERD (ICD-530.81) no sxs continue current medications  His updated medication list for this problem includes:    Omeprazole 20 Mg Cpdr (Omeprazole) ..... One by mouth daily   Problem # 2:  ADHD (ICD-314.01) strattera---doing well  Problem # 3:  HYPERTENSION (ICD-401.9) bp 130/75 His updated medication list for this problem includes:    Lisinopril 10 Mg Tabs (Lisinopril) .[name]... Take 1 tab by mouth daily  Prior BP: 108/76 (01/11/2008)  Labs Reviewed: Creat: 0.9 (07/28/2007) Chol: 139 (07/28/2007)   HDL: 20.9 (07/28/2007)   LDL: 85.4 (07/28/2007)   TG: 262 (07/28/2007)   Problem # 5:  Preventive Health Care (ICD-V70.0) tDap vaccine given: Left Deltoid, IM, 0.5cc dose.  manufacturer: Sheran Lawless   exp:   lot#:   Complete Medication List: 1)  Lisinopril 10 Mg Tabs (Lisinopril) .... Take 1 tab by mouth daily 2)  Omeprazole 20 Mg Cpdr (Omeprazole) .... One by mouth daily 3)  Strattera 100 Mg Caps (Atomoxetine hcl) .... Take 1 tablet by mouth once a day  Other Orders: Admin 1st Vaccine ([address]) Tdap => 21yrs IM (66440)   Patient Instructions: 1)  Please schedule  a follow-up appointment in 6 months. 2)  cpx  Appended Document: 6 month rov/njr reschedule with patient from bump/mhf   Tetanus/Td Vaccine    Vaccine Type: Tdap    Site: left deltoid    Mfr: Merck    Dose: 0.5 ml    Route: IM    Given by: Gladis Riffle, RN    Exp. Date: 05/03/2010    Lot #: [id number]

## 2010-04-07 NOTE — Assessment & Plan Note (Signed)
Summary: 1 MONTH ROV/NJR   Vital Signs:  Patient Profile:   30 Years Old Male Height:     71.25 inches Weight:      284 pounds Temp:     98. degrees F oral BP sitting:   120 / 70  (left arm) Cuff size:   regular  Vitals Entered By: Sid Falcon LPN (August 25, 2007 3:48 PM)                 Chief Complaint:  f/u htn.  History of Present Illness:  Hypertension Follow-Up      This is a 30 year old man who presents for Hypertension follow-up.  The patient denies lightheadedness, urinary frequency, headaches, edema, impotence, rash, and fatigue.  The patient denies the following associated symptoms: chest pain, chest pressure, exercise intolerance, dyspnea, palpitations, syncope, leg edema, and pedal edema.  Compliance with medications (by patient report) has been near 100%.  The patient reports that dietary compliance has been fair.  The patient reports exercising occasionally.    Past Medical History: Hypertension (dx 2009)  Past Surgical History: pilonidal cyst removal of metal fragment from right temple mole removal Social History: Occupation: EMT Single-engaged Current Smoker Alcohol use-yes Regular exercise-no  Family History: Family History Hypertension---father mother- alive and well  no other complaints in a complete ROS     Current Allergies: No known allergies       Physical Exam  General:     Well-developed,well-nourished,in no acute distress; alert,appropriate and cooperative throughout examination Head:     normocephalic and atraumatic.   Eyes:     pupils equal and pupils round.   Neck:     No deformities, masses, or tenderness noted. Chest Wall:     No deformities, masses, tenderness or gynecomastia noted. Lungs:     Normal respiratory effort, chest expands symmetrically. Lungs are clear to auscultation, no crackles or wheezes. Heart:     Normal rate and regular rhythm. S1 and S2 normal without gallop, murmur, click, rub or other extra  sounds. Abdomen:     Bowel sounds positive,abdomen soft and non-tender without masses, organomegaly or hernias noted.    Impression & Recommendations:  Problem # 1:  HYPERTENSION (ICD-401.9) trying to follow a diet and minimal exercise he needs ot exercise vigorously every day 1500 calorie diet His updated medication list for this problem includes:    Lisinopril 10 Mg Tabs (Lisinopril) .[name]... Take 1 tab by mouth daily   Complete Medication List: 1)  Lisinopril 10 Mg Tabs (Lisinopril) .... Take 1 tab by mouth daily 2)  Omeprazole 20 Mg Cpdr (Omeprazole) .... One by mouth daily   Patient Instructions: 1)  Please schedule a follow-up appointment in 6 months.   ]

## 2010-04-07 NOTE — Assessment & Plan Note (Signed)
Summary: fup on meds//ccm   Vital Signs:  Patient profile:   30 year old male Height:      73.5 inches (186.69 cm) Weight:      288.50 pounds (131.14 kg) BMI:     37.68 Temp:     98.3 degrees F (36.83 degrees C) oral BP sitting:   126 / 80  (left arm) Cuff size:   large  Vitals Entered By: Lucious Groves CMA (November 14, 2009 10:42 AM) CC: F/U--Discuss meds./kb Is Patient Diabetic? No Pain Assessment Patient in pain? no      Comments Ptnotes that all his meds are on hold due to losing his job/health insurance. He has been ok without them until now, but feels that he does need the Strattera and would like to discuss options for acquiring the med./kb   CC:  F/U--Discuss meds./kb.  History of Present Illness:  Follow-Up Visit      This is a 30 year old man who presents for Follow-up visit.  The patient denies chest pain and palpitations.  Since the last visit the patient notes no new problems or concerns.  The patient reports taking meds as prescribed.  When questioned about possible medication side effects, the patient notes none.    All other systems reviewed and were negative   Current Problems (verified): 1)  Preventive Health Care  (ICD-V70.0) 2)  Gerd  (ICD-530.81) 3)  Adhd  (ICD-314.01) 4)  Hypertension  (ICD-401.9)  Current Medications (verified): 1)  None  Allergies (verified): 1)  ! * Shellfish  Past History:  Past Medical History: Last updated: 01/11/2008 Hypertension (dx 2009) ADHD GERD  Past Surgical History: Last updated: 07/28/2007 pilonidal cyst removal of metal fragment from right temple mole removal  Family History: Last updated: 07/28/2007 Family History Hypertension---father mother- alive and well   Social History: Last updated: 11/14/2009 Occupation: --currently not working Current Smoker--cigar---none consistently Alcohol use-yes Regular exercise-no Married  Risk Factors: Exercise: no (07/28/2007)  Risk Factors: Smoking  Status: current (07/28/2007)  Social History: Occupation: --currently not working Current Smoker--cigar---none consistently Alcohol use-yes Regular exercise-no Married  Physical Exam  General:  alert and well-developed.   Heart:  normal rate and regular rhythm.     Impression & Recommendations:  Problem # 1:  ADHD (ICD-314.01) resume straterra side effects discussed referred to patient assistance program (lilly)  Problem # 2:  HYPERTENSION (ICD-401.9) noncompliant with meds bp ok-he should monitor goal bp < 140/90  Complete Medication List: 1)  Strattera 80 Mg Caps (Atomoxetine hcl) .... Take 1 tablet by mouth once a day Prescriptions: STRATTERA 80 MG  CAPS (ATOMOXETINE HCL) Take 1 tablet by mouth once a day  #30 x 6   Entered and Authorized by:   Birdie Sons MD   Signed by:   Birdie Sons MD on 11/14/2009   Method used:   Print then Give to Patient   RxID:   [id number]

## 2010-04-07 NOTE — Progress Notes (Signed)
Summary: please resend rx   Phone Note Call from Patient Call back at Home Phone [phone]   Caller: pt live Call For: Aikeem Lilley Summary of Call: the lisinopril 10mg  rx did not go through to [facility] on [address].  Please resend  Initial call taken by: Roselle Locus,  Aug 01, 2007 10:48 AM  Follow-up for Phone Call        Rx redone to [facility] [address].Patient notified.  Follow-up by: Gladis Riffle, RN,  Aug 01, 2007 12:33 PM      Prescriptions: LISINOPRIL 10 MG  TABS (LISINOPRIL) Take 1 tab by mouth daily  #90 x 3   Entered by:   Gladis Riffle, RN   Authorized by:   Birdie Sons MD   Signed by:   Gladis Riffle, RN on 08/01/2007   Method used:   Electronically sent to ...       [facility] [address] [id number]*       [address]       [place], [place]  [address]       Ph: [phone]       Fax: [phone]   RxID:   [id number]

## 2010-04-07 NOTE — Progress Notes (Signed)
Summary: strattera refill  Phone Note Call from Patient   Caller: Patient Call For: [phone] Details for Reason: strettra refill Summary of Call: patient is calling to see if he can have an increase in strattera?  would you like for him to come in for an office visit or a new rx? Initial call taken by: Kern Reap CMA,  June 18, 2008 2:30 PM  Follow-up for Phone Call        pt is aware  Follow-up by: Kern Reap CMA,  June 19, 2008 11:23 AM    New/Updated Medications: STRATTERA 60 MG CAPS (ATOMOXETINE HCL) 2 by mouth once daily   Prescriptions: STRATTERA 60 MG CAPS (ATOMOXETINE HCL) 2 by mouth once daily  #120 x 3   Entered and Authorized by:   Birdie Sons MD   Signed by:   Birdie Sons MD on 06/19/2008   Method used:   Electronically to        [facility] [address] [id number]* (retail)       [address] [place], [place]  [address]       Ph: [phone]       Fax: [phone]   RxID:   [id number]

## 2010-04-10 NOTE — Consult Note (Signed)
Summary: Dr Karl Ito note  Dr Karl Ito note   Imported By: Kassie Mends 08/31/2007 08:09:07  _____________________________________________________________________  External Attachment:    Type:   Image     Comment:   Dr Karl Ito note

## 2011-01-21 ENCOUNTER — Emergency Department
Admission: EM | Admit: 2011-01-21 | Discharge: 2011-01-21 | Disposition: A | Discharge status: Home / Self Care | Payer: BC Managed Care – PPO | Source: Home / Self Care | Attending: Family Medicine | Admitting: Family Medicine

## 2011-01-21 ENCOUNTER — Encounter: Payer: Self-pay | Admitting: *Deleted

## 2011-01-21 DIAGNOSIS — J209 Acute bronchitis, unspecified: Secondary | ICD-10-CM

## 2011-01-21 HISTORY — DX: Gastro-esophageal reflux disease without esophagitis: K21.9

## 2011-01-21 HISTORY — DX: Essential (primary) hypertension: I10

## 2011-01-21 HISTORY — DX: Attention-deficit hyperactivity disorder, unspecified type: F90.9

## 2011-01-21 MED ORDER — BENZONATATE 200 MG PO CAPS: 200.0000 mg | ORAL_CAPSULE | Freq: Every day | ORAL | Status: AC

## 2011-01-21 MED ORDER — AZITHROMYCIN 250 MG PO TABS: ORAL_TABLET | ORAL | Status: AC

## 2011-01-21 NOTE — ED Notes (Signed)
Pt c/o cough, fever, fatigue, and decreased appetite x 2 wks, worse x 4-5 days. He has taken goody powders, mucinex, and Alka seltzer.

## 2011-01-25 NOTE — ED Provider Notes (Signed)
History     CSN: [id number] Arrival date & time: 01/21/2011  6:27 PM   First MD Initiated Contact with Patient 01/21/11 1827      Chief Complaint  Patient presents with  . Cough  . Fever     HPI Comments: Patient complains of approximately 2 week history of gradually progressive URI symptoms beginning with a mild sore throat (now improved), followed by progressive nasal congestion.  A cough started about 4 days ago.  Complains of fatigue and initial myalgias.  Cough is now worse at night and generally non-productive during the day.  There has been no pleuritic pain, shortness of breath, or wheezes.   He has had persistent fever over the past two days.  He has asthma as a child  Patient is a 30 y.o. male presenting with cough and fever. The history is provided by the patient.  Cough This is a new problem. The current episode started more than 1 week ago. The problem occurs hourly. The problem has been gradually worsening. The cough is productive of sputum. The maximum temperature recorded prior to his arrival was 100 to 100.9 F. Associated symptoms include chills, sweats, ear congestion, headaches, rhinorrhea, sore throat and myalgias. Pertinent negatives include no chest pain, no ear pain, no shortness of breath, no wheezing and no eye redness. He has tried cough syrup for the symptoms. The treatment provided no relief. He is not a smoker.  Fever Primary symptoms of the febrile illness include fever, fatigue, headaches, cough, diarrhea and myalgias. Primary symptoms do not include wheezing or shortness of breath.    Past Medical History  Diagnosis Date  . Hypertension   . GERD (gastroesophageal reflux disease)   . ADHD (attention deficit hyperactivity disorder)     History reviewed. No pertinent past surgical history.  Family History  Problem Relation Age of Onset  . GER disease Mother   . Anxiety disorder Father   . Hypertension Father     History  Substance Use Topics  .  Smoking status: Never Smoker   . Smokeless tobacco: Not on file  . Alcohol Use: Yes     2 per wk      Review of Systems  Constitutional: Positive for fever, chills, activity change and fatigue.  HENT: Positive for congestion, sore throat, rhinorrhea and postnasal drip. Negative for ear pain and ear discharge.   Eyes: Negative.  Negative for redness.  Respiratory: Positive for cough. Negative for chest tightness, shortness of breath and wheezing.   Cardiovascular: Negative for chest pain.  Gastrointestinal: Positive for diarrhea.  Genitourinary: Negative.   Musculoskeletal: Positive for myalgias.  Skin: Negative.   Neurological: Positive for headaches.    Allergies  Shellfish allergy  Home Medications   Current Outpatient Rx  Name Route Sig Dispense Refill  . AZITHROMYCIN 250 MG PO TABS  Take 2 tabs today; then begin one tab once daily for 4 more days.  6 each 0  . BENZONATATE 200 MG PO CAPS Oral Take 1 capsule (200 mg total) by mouth at bedtime. 12 capsule 0    BP 138/91  Pulse 99  Temp(Src) 99.2 F (37.3 C) (Oral)  Resp 18  Ht 6\' 2"  (1.88 m)  Wt 301 lb 8 oz (136.76 kg)  BMI 38.71 kg/m2  SpO2 96%  Physical Exam  Nursing note and vitals reviewed. Constitutional: He is oriented to person, place, and time. He appears well-developed and well-nourished. No distress.  HENT:  Head: Normocephalic.  Right Ear: External  ear normal.  Left Ear: External ear normal.  Nose: Nose normal.  Mouth/Throat: Oropharynx is clear and moist. No oropharyngeal exudate.  Eyes: Conjunctivae and EOM are normal. Pupils are equal, round, and reactive to light. Right eye exhibits no discharge. Left eye exhibits no discharge.  Neck: Normal range of motion. Neck supple.  Cardiovascular: Normal rate, regular rhythm and normal heart sounds.   Pulmonary/Chest: Effort normal and breath sounds normal. No stridor. No respiratory distress. He has no wheezes. He has no rales. He exhibits no tenderness.    Abdominal: Soft. Bowel sounds are normal. He exhibits no distension. There is no tenderness.  Musculoskeletal: He exhibits no edema.  Lymphadenopathy:    He has cervical adenopathy.       Right cervical: Posterior cervical adenopathy present.       Left cervical: Posterior cervical adenopathy present.  Neurological: He is alert and oriented to person, place, and time.  Skin: Skin is warm and dry. He is not diaphoretic.    ED Course  Procedures:  None      1. Acute bronchitis       MDM  Begin  Azithromycin.  Begin expectorant/decongestant, topical decongestant, saline nasal spray and/or saline irrigation, and cough suppressant at bedtime.  Avoid antihistamines for now. Increase fluid intake, rest. Followup with PCP if not improving 7 to 10 days.         Donna Christen, MD 01/25/11 [phone]

## 2011-02-09 ENCOUNTER — Ambulatory Visit (INDEPENDENT_AMBULATORY_CARE_PROVIDER_SITE_OTHER): Payer: BC Managed Care – PPO | Admitting: Internal Medicine

## 2011-02-09 ENCOUNTER — Encounter: Payer: Self-pay | Admitting: Internal Medicine

## 2011-02-09 VITALS — BP 120/80 | Temp 98.4°F | Ht 75.0 in | Wt 309.0 lb

## 2011-02-09 DIAGNOSIS — F909 Attention-deficit hyperactivity disorder, unspecified type: Secondary | ICD-10-CM

## 2011-02-09 MED ORDER — ATOMOXETINE HCL 80 MG PO CAPS: 80.0000 mg | ORAL_CAPSULE | Freq: Every day | ORAL | Status: DC

## 2011-02-09 NOTE — Patient Instructions (Signed)
Return office visit 3 months    It is important that you exercise regularly, at least 20 minutes 3 to 4 times per week.  If you develop chest pain or shortness of breath seek  medical attention.  You need to lose weight.  Consider a lower calorie diet and regular exercise.

## 2011-02-09 NOTE — Progress Notes (Signed)
  Subjective:    Patient ID: Phillip Roach, male    DOB: 06-08-1980, 30 y.o.   MRN: [id number]  HPI 30 year old patient who has a history of ADD and had done well on Strattera in the past. He has started a new position with [employer] and feels that he will perform better back on the Strattera. This made a dramatic difference when he took this medication in the past. He has been off Strattera since April of 2011. At that time he had some borderline hypertension in the same amounts were avoided. But as mentioned he did have a very nice clinical response;  he wishes to resume Strattera. He does monitor his blood pressure with readings in the 120- 130 over mid 80 range      Review of Systems  Gastrointestinal:       Complains of increased flatus  Psychiatric/Behavioral: Positive for decreased concentration.       Objective:   Physical Exam  Constitutional:       Appears slightly hyperactive and quite talkative Blood pressure 120/80 range Weight 309          Assessment & Plan:  ADHD. We'll resume Strattera 80 mg daily and observe

## 2011-04-30 ENCOUNTER — Other Ambulatory Visit: Payer: BC Managed Care – PPO

## 2011-05-03 ENCOUNTER — Other Ambulatory Visit (INDEPENDENT_AMBULATORY_CARE_PROVIDER_SITE_OTHER): Payer: BC Managed Care – PPO

## 2011-05-03 DIAGNOSIS — Z0000 Encounter for general adult medical examination without abnormal findings: Secondary | ICD-10-CM

## 2011-05-03 LAB — POCT URINALYSIS DIPSTICK
Bilirubin, UA: NEGATIVE
Blood, UA: NEGATIVE
Glucose, UA: NEGATIVE
Ketones, UA: NEGATIVE
Leukocytes, UA: NEGATIVE
Nitrite, UA: NEGATIVE
Protein, UA: NEGATIVE
Spec Grav, UA: 1.015
Urobilinogen, UA: 0.2
pH, UA: 7.5

## 2011-05-03 LAB — CBC WITH DIFFERENTIAL/PLATELET
Basophils Absolute: 0 10*3/uL (ref 0.0–0.1)
Basophils Relative: 0.4 % (ref 0.0–3.0)
Eosinophils Absolute: 0.1 10*3/uL (ref 0.0–0.7)
Eosinophils Relative: 1.6 % (ref 0.0–5.0)
HCT: 43.2 % (ref 39.0–52.0)
Hemoglobin: 15 g/dL (ref 13.0–17.0)
Lymphocytes Relative: 37.2 % (ref 12.0–46.0)
Lymphs Abs: 2.5 10*3/uL (ref 0.7–4.0)
MCHC: 34.7 g/dL (ref 30.0–36.0)
MCV: 87.8 fl (ref 78.0–100.0)
Monocytes Absolute: 0.4 10*3/uL (ref 0.1–1.0)
Monocytes Relative: 5.9 % (ref 3.0–12.0)
Neutro Abs: 3.6 10*3/uL (ref 1.4–7.7)
Neutrophils Relative %: 54.9 % (ref 43.0–77.0)
Platelets: 219 10*3/uL (ref 150.0–400.0)
RBC: 4.92 Mil/uL (ref 4.22–5.81)
RDW: 12.4 % (ref 11.5–14.6)
WBC: 6.6 10*3/uL (ref 4.5–10.5)

## 2011-05-03 LAB — LIPID PANEL
Cholesterol: 163 mg/dL (ref 0–200)
HDL: 33.7 mg/dL — ABNORMAL LOW (ref >39.00)
LDL Cholesterol: 92 mg/dL (ref 0–99)
Total CHOL/HDL Ratio: 5
Triglycerides: 185 mg/dL — ABNORMAL HIGH (ref 0.0–149.0)
VLDL: 37 mg/dL (ref 0.0–40.0)

## 2011-05-03 LAB — BASIC METABOLIC PANEL
BUN: 10 mg/dL (ref 6–23)
CO2: 28 mEq/L (ref 19–32)
Calcium: 9 mg/dL (ref 8.4–10.5)
Chloride: 107 mEq/L (ref 96–112)
Creatinine, Ser: 0.7 mg/dL (ref 0.4–1.5)
GFR: 138.68 mL/min (ref >60.00)
Glucose, Bld: 90 mg/dL (ref 70–99)
Potassium: 4 mEq/L (ref 3.5–5.1)
Sodium: 140 mEq/L (ref 135–145)

## 2011-05-03 LAB — HEPATIC FUNCTION PANEL
ALT: 35 U/L (ref 0–53)
AST: 22 U/L (ref 0–37)
Albumin: 4.2 g/dL (ref 3.5–5.2)
Alkaline Phosphatase: 63 U/L (ref 39–117)
Bilirubin, Direct: 0 mg/dL (ref 0.0–0.3)
Total Bilirubin: 0.6 mg/dL (ref 0.3–1.2)
Total Protein: 7.1 g/dL (ref 6.0–8.3)

## 2011-05-03 LAB — TSH: TSH: 0.73 u[IU]/mL (ref 0.35–5.50)

## 2011-05-11 ENCOUNTER — Encounter: Payer: Self-pay | Admitting: Internal Medicine

## 2011-05-11 ENCOUNTER — Encounter: Payer: BC Managed Care – PPO | Admitting: Internal Medicine

## 2011-05-11 ENCOUNTER — Ambulatory Visit (INDEPENDENT_AMBULATORY_CARE_PROVIDER_SITE_OTHER): Payer: BC Managed Care – PPO | Admitting: Internal Medicine

## 2011-05-11 VITALS — BP 128/80 | HR 84 | Temp 98.1°F | Ht 73.5 in | Wt 306.0 lb

## 2011-05-11 DIAGNOSIS — Z0000 Encounter for general adult medical examination without abnormal findings: Secondary | ICD-10-CM

## 2011-05-11 DIAGNOSIS — F909 Attention-deficit hyperactivity disorder, unspecified type: Secondary | ICD-10-CM

## 2011-05-11 DIAGNOSIS — M549 Dorsalgia, unspecified: Secondary | ICD-10-CM

## 2011-05-11 MED ORDER — ATOMOXETINE HCL 80 MG PO CAPS: 80.0000 mg | ORAL_CAPSULE | Freq: Every day | ORAL | Status: DC

## 2011-05-11 NOTE — Progress Notes (Signed)
Patient ID: Phillip Roach, male   DOB: [birth year], 31 y.o.   MRN: [id number] cpx Works as paramedic  Past Medical History  Diagnosis Date  . Hypertension   . GERD (gastroesophageal reflux disease)   . ADHD (attention deficit hyperactivity disorder)     History   Social History  . Marital Status: Married    Spouse Name: N/A    Number of Children: N/A  . Years of Education: N/A   Occupational History  . Not on file.   Social History Main Topics  . Smoking status: Never Smoker   . Smokeless tobacco: Never Used  . Alcohol Use: Yes     2 per wk  . Drug Use: No  . Sexually Active: Not on file   Other Topics Concern  . Not on file   Social History Narrative  . No narrative on file    No past surgical history on file.  Family History  Problem Relation Age of Onset  . GER disease Mother   . Anxiety disorder Father   . Hypertension Father     Allergies  Allergen Reactions  . Shellfish Allergy     Iodine ok per pt    Current Outpatient Prescriptions on File Prior to Visit  Medication Sig Dispense Refill  . atomoxetine (STRATTERA) 80 MG capsule Take 1 capsule (80 mg total) by mouth daily.  90 capsule  4     patient denies chest pain, shortness of breath, orthopnea. Denies lower extremity edema, abdominal pain, change in appetite, change in bowel movements. Patient denies rashes, musculoskeletal complaints. No other specific complaints in a complete review of systems.   BP 128/80  Pulse 84  Temp(Src) 98.1 F (36.7 C) (Oral)  Ht 6' 1.5" (1.867 m)  Wt 306 lb (138.801 kg)  BMI 39.82 kg/m2 Well-developed male in no acute distress. HEENT exam atraumatic, normocephalic, extraocular muscles are intact. Conjunctivae are pink without exudate. Neck is supple without lymphadenopathy, thyromegaly, jugular venous distention. Chest is clear to auscultation without increased work of breathing. Cardiac exam S1-S2 are regular. The PMI is normal. No significant murmurs or  gallops. Abdominal exam active bowel sounds, soft, nontender. No abdominal bruits. Extremities no clubbing cyanosis or edema. Peripheral pulses are normal without bruits. Neurologic exam alert and oriented without any motor or sensory deficits.  Well Visit: health maint UTD  bac pain---refer health source

## 2011-10-05 ENCOUNTER — Encounter: Payer: Self-pay | Admitting: Family Medicine

## 2011-10-05 ENCOUNTER — Ambulatory Visit (INDEPENDENT_AMBULATORY_CARE_PROVIDER_SITE_OTHER): Payer: BC Managed Care – PPO | Admitting: Family Medicine

## 2011-10-05 ENCOUNTER — Telehealth: Payer: Self-pay | Admitting: Internal Medicine

## 2011-10-05 VITALS — BP 118/78 | HR 104 | Temp 98.6°F | Wt 290.0 lb

## 2011-10-05 DIAGNOSIS — H109 Unspecified conjunctivitis: Secondary | ICD-10-CM

## 2011-10-05 MED ORDER — NEOMYCIN-POLYMYXIN-HC 3.5-10000-1 OP SUSP: 2.0000 [drp] | Freq: Four times a day (QID) | OPHTHALMIC | Status: AC

## 2011-10-05 NOTE — Progress Notes (Signed)
  Subjective:    Patient ID: Phillip Roach, male    DOB: 1980-09-28, 31 y.o.   MRN: [id number]  HPI Here for 2 days of redness, itching, and yellow DC in the left eye. No vision problems or eye pain. He wears contacts but has kept them out today.    Review of Systems  Constitutional: Negative.   HENT: Negative.   Eyes: Positive for discharge, redness and itching. Negative for photophobia, pain and visual disturbance.  Respiratory: Negative.        Objective:   Physical Exam  Constitutional: He appears well-developed and well-nourished.  Eyes:       Right eye is clear. Left conjunctiva is red without DC          Assessment & Plan:  Use warm compresses prn

## 2011-10-05 NOTE — Telephone Encounter (Signed)
Caller: Jordi/Patient; PCP: Birdie Sons; CB#: [phone]; ; ; Call regarding Eye Drainage;  Afebrile. Onset- 10/04/11 Pt c/o of eye redness, yellow drainage and pain. Emergent s/s of Eye infection or irritation protocol r/o. Pt to see provider within 4hrs. Appt scheduled today at 3:45pm  with Dr. Clent Ridges.

## 2012-04-25 ENCOUNTER — Telehealth: Payer: Self-pay | Admitting: Internal Medicine

## 2012-04-26 ENCOUNTER — Telehealth: Payer: Self-pay | Admitting: Internal Medicine

## 2012-04-26 NOTE — Telephone Encounter (Signed)
[facility] Triage Call Report Triage Record Num: [id number] Operator: Tomasita Crumble Patient Name: Phillip Roach Call Date & Time: 04/25/2012 5:04:45PM Patient Phone: [phone] PCP: Valetta Mole. [facility] Patient Gender: Male PCP Fax : [phone] Patient DOB: 1980-09-01 Practice Name: [facility] Reason for Call: Caller: Phillip Roach/Patient; PCP: Birdie Sons (Adults only); CB#: [phone]; Call regarding Medication Question; Wilhemena Durie makes him feel tired; he asks if dose can be decreased. Onset of symptoms 4-6 months; he asks if reducing dose would decrease side effects and still accomplish desired results. Advised follow up with provider during regular office hours per Medication Questions protocol. He relates that he has a few weeks left on the Rx and an upcoming appointment with Dr. Cato Mulligan. Protocol(s) Used: Medication Questions - Adult Recommended Outcome per Protocol: Speak with Provider or Pharmacist within 24 hours Reason for Outcome: Older adult with questions about prescribed and/or nonprescribed medications not covered by available resources Care Advice: ~ 02/

## 2012-04-26 NOTE — Telephone Encounter (Signed)
Patent calling to check on status of this. Please call him re this medication.

## 2012-04-27 NOTE — Telephone Encounter (Signed)
Left message for pt to call back  °

## 2012-04-27 NOTE — Telephone Encounter (Signed)
Ok to decrease Straterra to 40 mg po qd. #30/3 refills

## 2012-04-28 MED ORDER — ATOMOXETINE HCL 40 MG PO CAPS: 40.0000 mg | ORAL_CAPSULE | Freq: Every day | ORAL | Status: DC

## 2012-04-28 NOTE — Telephone Encounter (Signed)
rx sent in electronically, pt aware 

## 2012-04-28 NOTE — Addendum Note (Signed)
Addended by: Alfred Levins D on: 04/28/2012 08:57 AM   Modules accepted: Orders, Medications

## 2012-06-01 ENCOUNTER — Telehealth: Payer: Self-pay | Admitting: Internal Medicine

## 2012-06-01 MED ORDER — ATOMOXETINE HCL 40 MG PO CAPS: 40.0000 mg | ORAL_CAPSULE | Freq: Every day | ORAL | Status: DC

## 2012-06-01 NOTE — Telephone Encounter (Signed)
rx sent in electronically 

## 2012-06-01 NOTE — Telephone Encounter (Signed)
Pt failed to pick up new script for atomoxetine (STRATTERA) 40 MG capsule that was sent in 04/28/12. Now [facility] is telling pt they never received.  Pt had finished up his old meds before he went to pick this up. Pt would like to know if you would resend. [facility]/N Main Oncologist

## 2012-06-02 ENCOUNTER — Telehealth: Payer: Self-pay | Admitting: Internal Medicine

## 2012-06-02 MED ORDER — ATOMOXETINE HCL 40 MG PO CAPS: 40.0000 mg | ORAL_CAPSULE | Freq: Every day | ORAL | Status: DC

## 2012-06-02 NOTE — Telephone Encounter (Signed)
rx sent in electronically 

## 2012-06-02 NOTE — Telephone Encounter (Signed)
Patient stated that only a 30 day supply of his atomoxetine (STRATTERA) 40 MG was called in. For insurance purposes, he always gets a 90 day supply, because its cheaper. He would like to know if this can be fixed. Please assist.

## 2012-06-27 ENCOUNTER — Other Ambulatory Visit (INDEPENDENT_AMBULATORY_CARE_PROVIDER_SITE_OTHER): Payer: BC Managed Care – PPO

## 2012-06-27 DIAGNOSIS — Z0000 Encounter for general adult medical examination without abnormal findings: Secondary | ICD-10-CM

## 2012-06-27 LAB — CBC WITH DIFFERENTIAL/PLATELET
Basophils Absolute: 0 10*3/uL (ref 0.0–0.1)
Basophils Relative: 0.5 % (ref 0.0–3.0)
Eosinophils Absolute: 0.1 10*3/uL (ref 0.0–0.7)
Eosinophils Relative: 1.1 % (ref 0.0–5.0)
HCT: 44.3 % (ref 39.0–52.0)
Hemoglobin: 15.4 g/dL (ref 13.0–17.0)
Lymphocytes Relative: 34.6 % (ref 12.0–46.0)
Lymphs Abs: 2.4 10*3/uL (ref 0.7–4.0)
MCHC: 34.7 g/dL (ref 30.0–36.0)
MCV: 87.4 fl (ref 78.0–100.0)
Monocytes Absolute: 0.5 10*3/uL (ref 0.1–1.0)
Monocytes Relative: 7 % (ref 3.0–12.0)
Neutro Abs: 3.9 10*3/uL (ref 1.4–7.7)
Neutrophils Relative %: 56.8 % (ref 43.0–77.0)
Platelets: 227 10*3/uL (ref 150.0–400.0)
RBC: 5.07 Mil/uL (ref 4.22–5.81)
RDW: 12.5 % (ref 11.5–14.6)
WBC: 6.9 10*3/uL (ref 4.5–10.5)

## 2012-06-27 LAB — POCT URINALYSIS DIPSTICK
Bilirubin, UA: NEGATIVE
Blood, UA: NEGATIVE
Glucose, UA: NEGATIVE
Ketones, UA: NEGATIVE
Leukocytes, UA: NEGATIVE
Nitrite, UA: NEGATIVE
Protein, UA: NEGATIVE
Spec Grav, UA: 1.01
Urobilinogen, UA: 0.2
pH, UA: 6.5

## 2012-06-27 LAB — BASIC METABOLIC PANEL
BUN: 10 mg/dL (ref 6–23)
CO2: 29 mEq/L (ref 19–32)
Calcium: 8.8 mg/dL (ref 8.4–10.5)
Chloride: 103 mEq/L (ref 96–112)
Creatinine, Ser: 0.8 mg/dL (ref 0.4–1.5)
GFR: 116.54 mL/min (ref >60.00)
Glucose, Bld: 89 mg/dL (ref 70–99)
Potassium: 4 mEq/L (ref 3.5–5.1)
Sodium: 137 mEq/L (ref 135–145)

## 2012-06-27 LAB — HEPATIC FUNCTION PANEL
ALT: 31 U/L (ref 0–53)
AST: 21 U/L (ref 0–37)
Albumin: 4.5 g/dL (ref 3.5–5.2)
Alkaline Phosphatase: 49 U/L (ref 39–117)
Bilirubin, Direct: 0 mg/dL (ref 0.0–0.3)
Total Bilirubin: 0.7 mg/dL (ref 0.3–1.2)
Total Protein: 7.2 g/dL (ref 6.0–8.3)

## 2012-06-27 LAB — LIPID PANEL
Cholesterol: 147 mg/dL (ref 0–200)
HDL: 31.1 mg/dL — ABNORMAL LOW (ref >39.00)
LDL Cholesterol: 94 mg/dL (ref 0–99)
Total CHOL/HDL Ratio: 5
Triglycerides: 112 mg/dL (ref 0.0–149.0)
VLDL: 22.4 mg/dL (ref 0.0–40.0)

## 2012-06-27 LAB — TSH: TSH: 1.15 u[IU]/mL (ref 0.35–5.50)

## 2012-07-04 ENCOUNTER — Encounter: Payer: BC Managed Care – PPO | Admitting: Internal Medicine

## 2012-09-19 ENCOUNTER — Ambulatory Visit (INDEPENDENT_AMBULATORY_CARE_PROVIDER_SITE_OTHER): Payer: BC Managed Care – PPO | Admitting: Internal Medicine

## 2012-09-19 ENCOUNTER — Encounter: Payer: Self-pay | Admitting: Internal Medicine

## 2012-09-19 VITALS — BP 130/82 | HR 76 | Temp 98.2°F | Ht 74.5 in | Wt 303.0 lb

## 2012-09-19 DIAGNOSIS — Z0000 Encounter for general adult medical examination without abnormal findings: Secondary | ICD-10-CM

## 2012-09-19 DIAGNOSIS — E669 Obesity, unspecified: Secondary | ICD-10-CM | POA: Insufficient documentation

## 2012-09-19 MED ORDER — ATOMOXETINE HCL 60 MG PO CAPS: 60.0000 mg | ORAL_CAPSULE | Freq: Every day | ORAL | Status: DC

## 2012-09-19 NOTE — Progress Notes (Signed)
Patient ID: Phillip Roach, male   DOB: 04-Jun-1980, 32 y.o.   MRN: [id number]  cpx  Past Medical History  Diagnosis Date  . Hypertension   . GERD (gastroesophageal reflux disease)   . ADHD (attention deficit hyperactivity disorder)     History   Social History  . Marital Status: Married    Spouse Name: N/A    Number of Children: N/A  . Years of Education: N/A   Occupational History  . Not on file.   Social History Main Topics  . Smoking status: Never Smoker   . Smokeless tobacco: Never Used  . Alcohol Use: Yes     Comment: couple times a month  . Drug Use: No  . Sexually Active: Not on file   Other Topics Concern  . Not on file   Social History Narrative  . No narrative on file    No past surgical history on file.  Family History  Problem Relation Age of Onset  . GER disease Mother   . Anxiety disorder Father   . Hypertension Father     Allergies  Allergen Reactions  . Shellfish Allergy     Iodine ok per pt    Current Outpatient Prescriptions on File Prior to Visit  Medication Sig Dispense Refill  . atomoxetine (STRATTERA) 40 MG capsule Take 1 capsule (40 mg total) by mouth daily.  90 capsule  1  . loratadine (CLARITIN) 10 MG tablet Take 10 mg by mouth daily.       No current facility-administered medications on file prior to visit.     patient denies chest pain, shortness of breath, orthopnea. Denies lower extremity edema, abdominal pain, change in appetite, change in bowel movements. Patient denies rashes, musculoskeletal complaints. No other specific complaints in a complete review of systems.   There were no vitals taken for this visit.  well-developed well-nourished male in no acute distress. HEENT exam atraumatic, normocephalic, neck supple without jugular venous distention. Chest clear to auscultation cardiac exam S1-S2 are regular. Abdominal exam overweight with bowel sounds, soft and nontender. Extremities no edema. Neurologic exam is alert  with a normal gait.  Well Visit- health maint UTD Weight loss discussed. He needs to eat much less and he needs to exercise

## 2012-10-20 ENCOUNTER — Other Ambulatory Visit: Payer: Self-pay | Admitting: Internal Medicine

## 2012-10-31 ENCOUNTER — Telehealth: Payer: Self-pay | Admitting: Internal Medicine

## 2012-10-31 NOTE — Telephone Encounter (Signed)
Pt states he had asked for a refill and an increase in his STRATTERA , but he was at 40 at new rx was 80mg . He had wanted to go to 60mg .  He tried the 80 but the 80 is too strong. Pt states wrong dose was sent in. Would like new rx for the STRATTERA 60 MG capsule Pharm: [facility] main Oncologist.

## 2012-11-01 MED ORDER — ATOMOXETINE HCL 40 MG PO CAPS: 40.0000 mg | ORAL_CAPSULE | Freq: Every day | ORAL | Status: DC

## 2012-11-01 MED ORDER — ATOMOXETINE HCL 60 MG PO CAPS: 60.0000 mg | ORAL_CAPSULE | Freq: Every day | ORAL | Status: DC

## 2012-11-01 NOTE — Addendum Note (Signed)
Addended by: Alfred Levins D on: 11/01/2012 09:47 AM   Modules accepted: Orders, Medications

## 2012-11-01 NOTE — Telephone Encounter (Signed)
rx sent in electronically, pt aware, canceled 80 mg refills at pharmacy

## 2012-11-01 NOTE — Telephone Encounter (Signed)
ok 

## 2013-05-30 ENCOUNTER — Other Ambulatory Visit: Payer: Self-pay | Admitting: Internal Medicine

## 2013-09-10 ENCOUNTER — Telehealth: Payer: Self-pay | Admitting: Internal Medicine

## 2013-09-10 NOTE — Telephone Encounter (Signed)
Patient Information:  Caller Name: Kaien  Phone: [phone]  Patient: Phillip Roach, Phillip Roach  Gender: Male  DOB: 01-01-1981  Age: 33 Years  PCP: Phoebe Sharps (Adults only, leaving end of July 2015)  Office Follow Up:  Does the office need to follow up with this patient?: No  Instructions For The Office: N/A   Symptoms  Reason For Call & Symptoms: Husband calling and states wife is determined that he has yeast growing on his genital area. She has been cleared by GYN but is insistent that this patient be "checked". Patient has no symptoms. No rash, no discharge, no discomfort. Advised nothing to evaluate without symptoms.  Reviewed Health History In EMR: Yes  Reviewed Medications In EMR: Yes  Reviewed Allergies In EMR: Yes  Reviewed Surgeries / Procedures: Yes  Date of Onset of Symptoms: 09/10/2013  Guideline(s) Used:  No Protocol Available - Information Only  Disposition Per Guideline:   Home Care  Reason For Disposition Reached:   Information only question and nurse able to answer  Advice Given:  N/A  Patient Will Follow Care Advice:  YES

## 2013-09-10 NOTE — Telephone Encounter (Signed)
Noted  

## 2013-11-29 ENCOUNTER — Other Ambulatory Visit: Payer: Self-pay | Admitting: Internal Medicine

## 2013-11-30 NOTE — Telephone Encounter (Signed)
Pt called back and is scheduled to see Dr. Yong Channel on 12/04/13 for med check and will est with Pandonda on 12/25/13.

## 2013-11-30 NOTE — Telephone Encounter (Signed)
lmom for pt to cb

## 2013-12-04 ENCOUNTER — Encounter: Payer: Self-pay | Admitting: Family Medicine

## 2013-12-04 ENCOUNTER — Ambulatory Visit (INDEPENDENT_AMBULATORY_CARE_PROVIDER_SITE_OTHER): Payer: Commercial Managed Care - PPO | Admitting: Family Medicine

## 2013-12-04 VITALS — BP 130/82 | HR 68 | Temp 98.3°F | Wt 291.0 lb

## 2013-12-04 DIAGNOSIS — Z23 Encounter for immunization: Secondary | ICD-10-CM

## 2013-12-04 DIAGNOSIS — F909 Attention-deficit hyperactivity disorder, unspecified type: Secondary | ICD-10-CM

## 2013-12-04 MED ORDER — ATOMOXETINE HCL 60 MG PO CAPS: ORAL_CAPSULE | ORAL | Status: DC

## 2013-12-04 NOTE — Assessment & Plan Note (Signed)
Well controlled with strattera at 80mg  unfortunately side effect profile too strong (libido decrease, low energy, irritability). Did better on 60mg . This was refilled for him. 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

## 2013-12-04 NOTE — Patient Instructions (Signed)
ADHD  Strattera lets try 60mg  sent in #90.   Check in with me within 3 months and we will make decision if this will help enough with reducing side effects if not, consider lower dose and perhaps low dose ritalin

## 2013-12-04 NOTE — Progress Notes (Signed)
  Garret Reddish, MD Phone: [phone]  Subjective:   Phillip Roach is a 33 y.o. year old very pleasant male patient who presents with the following:  ADHD low energy, libido, irritability up taking 80mg  strattera (ran out of 60mg  but had some 80mg  pills left over). ADHD symptoms themselves are well controlled. Was on stimulants as a child and eventually came off and work came back to him. Got started back on strattera in 2009. 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   Past Medical History- Patient Active Problem List   Diagnosis Date Noted  . Obesity (BMI 30-39.9) 09/19/2012  . GERD 01/11/2008  . ADHD 08/29/2007   Medications- reviewed and updated Current Outpatient Prescriptions  Medication Sig Dispense Refill  . atomoxetine (STRATTERA) 60 MG capsule TAKE ONE CAPSULE BY MOUTH EVERY DAY  90 capsule  0  . loratadine (CLARITIN) 10 MG tablet Take 10 mg by mouth daily.      [name] ibuprofen (ADVIL,MOTRIN) 200 MG tablet Take 200 mg by mouth every 6 (six) hours as needed for pain.       No current facility-administered medications for this visit.    Objective: BP 130/82  Pulse 68  Temp(Src) 98.3 F (36.8 C)  Wt 291 lb (131.997 kg) Gen: NAD, resting comfortably Psych: talkative, mood not depressed  Assessment/Plan:  ADHD Well controlled with strattera at 80mg  unfortunately side effect profile too strong (libido decrease, low energy, irritability). Did better on 60mg . This was refilled for him. 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    Patient was resistant to my efforts to talk to him about weight. He claims he has added a lot of muscle.  Discussed reasonable weight gain even with his new found muscle would likely be closer to 250 (althoughgoal should likely be lower). Advised of healthy lifestyle changes.   Meds ordered this encounter  Medications  . atomoxetine (STRATTERA) 60 MG capsule    Sig: TAKE ONE CAPSULE BY MOUTH EVERY DAY    Dispense:  90 capsule    Refill:  0

## 2013-12-25 ENCOUNTER — Ambulatory Visit: Payer: BC Managed Care – PPO | Admitting: Family

## 2014-01-15 ENCOUNTER — Ambulatory Visit (INDEPENDENT_AMBULATORY_CARE_PROVIDER_SITE_OTHER): Payer: Commercial Managed Care - PPO | Admitting: Family

## 2014-01-15 ENCOUNTER — Encounter: Payer: Self-pay | Admitting: Family

## 2014-01-15 VITALS — BP 110/70 | HR 77 | Wt 301.8 lb

## 2014-01-15 DIAGNOSIS — F909 Attention-deficit hyperactivity disorder, unspecified type: Secondary | ICD-10-CM

## 2014-01-15 DIAGNOSIS — M19079 Primary osteoarthritis, unspecified ankle and foot: Secondary | ICD-10-CM

## 2014-01-15 DIAGNOSIS — J309 Allergic rhinitis, unspecified: Secondary | ICD-10-CM

## 2014-01-15 DIAGNOSIS — F988 Other specified behavioral and emotional disorders with onset usually occurring in childhood and adolescence: Secondary | ICD-10-CM

## 2014-01-15 DIAGNOSIS — R635 Abnormal weight gain: Secondary | ICD-10-CM

## 2014-01-15 NOTE — Patient Instructions (Signed)
Exercise to Stay Healthy Exercise helps you become and stay healthy. EXERCISE IDEAS AND TIPS Choose exercises that:  You enjoy.  Fit into your day. You do not need to exercise really hard to be healthy. You can do exercises at a slow or medium level and stay healthy. You can:  Stretch before and after working out.  Try yoga, Pilates, or tai chi.  Lift weights.  Walk fast, swim, jog, run, climb stairs, bicycle, dance, or rollerskate.  Take aerobic classes. Exercises that burn about 150 calories:  Running 1  miles in 15 minutes.  Playing volleyball for 45 to 60 minutes.  Washing and waxing a car for 45 to 60 minutes.  Playing touch football for 45 minutes.  Walking 1  miles in 35 minutes.  Pushing a stroller 1  miles in 30 minutes.  Playing basketball for 30 minutes.  Raking leaves for 30 minutes.  Bicycling 5 miles in 30 minutes.  Walking 2 miles in 30 minutes.  Dancing for 30 minutes.  Shoveling snow for 15 minutes.  Swimming laps for 20 minutes.  Walking up stairs for 15 minutes.  Bicycling 4 miles in 15 minutes.  Gardening for 30 to 45 minutes.  Jumping rope for 15 minutes.  Washing windows or floors for 45 to 60 minutes. Document Released: 03/27/2010 Document Revised: 05/17/2011 Document Reviewed: 03/27/2010 ExitCare Patient Information 2015 [employer]. This information is not intended to replace advice given to you by your health care provider. Make sure you discuss any questions you have with your health care provider.  

## 2014-01-15 NOTE — Progress Notes (Signed)
Subjective:    Patient ID: Phillip Roach, male    DOB: Jan 01, 1981, 33 y.o.   MRN: [id number]  HPI  33 year old, white male, nonsmoker is in today to be established. He has a history of ADD and is currently on Stratera 60 mg and tolerating it well. Was decreased from 80 mg  Due to side effects of decreased libido and irritability. Also reports a history of allergic rhinitis and takes Claritin daily that helps. Has a history of chronic sinusitis but Claritin has been beneficial and keep and sinus trouble at bay.  Also reports a history of left ankle osteoarthritis, right knee osteoarthritis, bulging disc at L5-S1, and plantar fasciitis of the right foot. All of these conditions are currently stable. Has had an increase of 10 pounds since his last office visit.  Review of Systems  Constitutional: Negative.   Respiratory: Negative.   Cardiovascular: Negative.   Gastrointestinal: Negative.   Endocrine: Negative.   Genitourinary: Negative.   Musculoskeletal: Negative.   Skin: Negative.   Neurological: Negative.   Psychiatric/Behavioral: Negative.        Doing well on concentration    Past Medical History  Diagnosis Date  . Hypertension   . GERD (gastroesophageal reflux disease)   . ADHD (attention deficit hyperactivity disorder)     History   Social History  . Marital Status: Married    Spouse Name: N/A    Number of Children: N/A  . Years of Education: N/A   Occupational History  . Not on file.   Social History Main Topics  . Smoking status: Never Smoker   . Smokeless tobacco: Never Used  . Alcohol Use: Yes     Comment: couple times a month  . Drug Use: No  . Sexual Activity: Not on file   Other Topics Concern  . Not on file   Social History Narrative    History reviewed. No pertinent past surgical history.  Family History  Problem Relation Age of Onset  . GER disease Mother   . Anxiety disorder Father   . Hypertension Father     Allergies  Allergen  Reactions  . Shellfish Allergy     Iodine ok per pt    Current Outpatient Prescriptions on File Prior to Visit  Medication Sig Dispense Refill  . atomoxetine (STRATTERA) 60 MG capsule TAKE ONE CAPSULE BY MOUTH EVERY DAY 90 capsule 0  . ibuprofen (ADVIL,MOTRIN) 200 MG tablet Take 200 mg by mouth every 6 (six) hours as needed for pain.    [name] loratadine (CLARITIN) 10 MG tablet Take 10 mg by mouth daily.     No current facility-administered medications on file prior to visit.    BP 110/70 mmHg  Pulse 77  Wt 301 lb 12.8 oz (136.896 kg)chart    Objective:   Physical Exam  Constitutional: He is oriented to person, place, and time. He appears well-developed and well-nourished.  HENT:  Right Ear: External ear normal.  Left Ear: External ear normal.  Nose: Nose normal.  Mouth/Throat: Oropharynx is clear and moist.  Neck: Normal range of motion. Neck supple.  Cardiovascular: Normal rate, regular rhythm and normal heart sounds.   Pulmonary/Chest: Effort normal and breath sounds normal.  Abdominal: Soft. Bowel sounds are normal.  Musculoskeletal: Normal range of motion.  Neurological: He is alert and oriented to person, place, and time.  Skin: Skin is warm and dry.  Psychiatric: He has a normal mood and affect.  Assessment & Plan:  Phillip Roach was seen today for follow-up.  Diagnoses and associated orders for this visit:  ADD (attention deficit disorder)  Allergic rhinitis, unspecified allergic rhinitis type  Weight gain  Osteoarthritis of ankle, unspecified laterality, unspecified osteoarthritis type    Discussed weight gain with patient.denies any dietary changes or lack of exercise. 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 Outside of that, he does not do any additional cardiovascular exercise. Advised him to decrease his weight.

## 2014-01-15 NOTE — Progress Notes (Signed)
Pre visit review using our clinic review tool, if applicable. No additional management support is needed unless otherwise documented below in the visit note. 

## 2014-02-12 ENCOUNTER — Telehealth: Payer: Self-pay | Admitting: Family

## 2014-02-12 MED ORDER — ATOMOXETINE HCL 60 MG PO CAPS: ORAL_CAPSULE | ORAL | Status: DC

## 2014-02-12 NOTE — Telephone Encounter (Signed)
Patient needs a re-fill on atomoxetine (STRATTERA) 60 MG capsule. He would like to have re-fills on the Rx if possible.  [facility] - [place] - [address]

## 2014-02-12 NOTE — Telephone Encounter (Signed)
Done with note to pharmacy to fill 03/05/14

## 2014-02-14 NOTE — Telephone Encounter (Signed)
Patient states he ran out of  Medication on 02/13/14.  He would like to know why he can't get it filled until 03/05/14?? Please advise.

## 2014-02-15 NOTE — Telephone Encounter (Signed)
Medication was filled for 90 day supply by Dr. Yong Channel on 12/04/13 and is dosed to take qd

## 2014-02-15 NOTE — Telephone Encounter (Signed)
Patient is aware and will go to the original pharmacy to get remaining fills.

## 2014-06-25 ENCOUNTER — Telehealth: Payer: Self-pay | Admitting: Family

## 2014-06-25 NOTE — Telephone Encounter (Signed)
Denied. Must be seen before refill is provided.

## 2014-06-25 NOTE — Telephone Encounter (Signed)
Patient need re-fill on atomoxetine (STRATTERA) 60 MG capsule sent to [facility] [place] [place] [facility] [address].  Patient is going to establish with [facility] on 07/01/14 but he doesn't have enough medication to last till then.

## 2014-06-26 NOTE — Telephone Encounter (Signed)
Patient aware.

## 2014-07-01 ENCOUNTER — Ambulatory Visit (INDEPENDENT_AMBULATORY_CARE_PROVIDER_SITE_OTHER): Payer: Commercial Managed Care - PPO | Admitting: Adult Health

## 2014-07-01 ENCOUNTER — Encounter: Payer: Self-pay | Admitting: Adult Health

## 2014-07-01 VITALS — BP 132/80 | Temp 98.1°F | Ht 74.5 in | Wt 315.1 lb

## 2014-07-01 DIAGNOSIS — Z7189 Other specified counseling: Secondary | ICD-10-CM

## 2014-07-01 DIAGNOSIS — F909 Attention-deficit hyperactivity disorder, unspecified type: Secondary | ICD-10-CM | POA: Diagnosis not present

## 2014-07-01 DIAGNOSIS — Z3009 Encounter for other general counseling and advice on contraception: Secondary | ICD-10-CM | POA: Insufficient documentation

## 2014-07-01 DIAGNOSIS — Z7689 Persons encountering health services in other specified circumstances: Secondary | ICD-10-CM

## 2014-07-01 MED ORDER — ATOMOXETINE HCL 60 MG PO CAPS: ORAL_CAPSULE | ORAL | Status: DC

## 2014-07-01 NOTE — Patient Instructions (Addendum)
I sent a prescription to the pharmacy for Strattera 60 mg. Take as directed. Follow up with me in November for a complete physical. It was great seeing you again.

## 2014-07-01 NOTE — Progress Notes (Signed)
Pre visit review using our clinic review tool, if applicable. No additional management support is needed unless otherwise documented below in the visit note. 

## 2014-07-01 NOTE — Assessment & Plan Note (Signed)
Appears well controlled on Strattera 60 mg. He reports that his depression has gone away and that his libido has returned since decreasing from 80mg . Refill sent to pharmacy for 90 day supply.

## 2014-07-01 NOTE — Progress Notes (Signed)
HPI:  Phillip Roach is here to establish care.  Last PCP and physical: 01/15/2014 with Megan Salon  34 year old, white male, nonsmoker is in today to establish care. He has a history of ADD and is currently on Stratera 60 mg and tolerating it well. Was decreased from 80 mgdue to side effects of decreased libido and irritability. Also reports a history of allergic rhinitis and takes Claritin daily that helps. Has a history of chronic sinusitis but Claritin has been beneficial and keep and sinus trouble at bay.  Also reports a history of left ankle osteoarthritis, right knee osteoarthritis, bulging disc at L5-S1, and plantar fasciitis of the right foot. All of these conditions are currently stable. Has had an increase of 10 pounds since his last office visit.    Has the following chronic problems that require follow up and concerns today:  ADHD: Is out of Strattera and needs it refilled. He has been out for about a week and needs it for school.   He has no other complaints at this time.   Hypertension is controlled with diet.    ROS negative for unless reported above: fevers, unintentional weight loss, hearing or vision loss, chest pain, palpitations, struggling to breath, hemoptysis, melena, hematochezia, hematuria, falls, loc, si, thoughts of self harm  Past Medical History  Diagnosis Date  . Hypertension   . GERD (gastroesophageal reflux disease)   . ADHD (attention deficit hyperactivity disorder)     History reviewed. No pertinent past surgical history.  Family History  Problem Relation Age of Onset  . GER disease Mother   . Anxiety disorder Father   . Hypertension Father     History   Social History  . Marital Status: Married    Spouse Name: N/A  . Number of Children: N/A  . Years of Education: N/A   Social History Main Topics  . Smoking status: Never Smoker   . Smokeless tobacco: Never Used  . Alcohol Use: Yes     Comment: couple times a month  . Drug  Use: No  . Sexual Activity: Not on file   Other Topics Concern  . None   Social History Narrative     Current outpatient prescriptions:  .  atomoxetine (STRATTERA) 60 MG capsule, TAKE ONE CAPSULE BY MOUTH EVERY DAY, Disp: 90 capsule, Rfl: 0 .  ibuprofen (ADVIL,MOTRIN) 200 MG tablet, Take 200 mg by mouth every 6 (six) hours as needed for pain., Disp: , Rfl:  .  loratadine (CLARITIN) 10 MG tablet, Take 10 mg by mouth daily., Disp: , Rfl:   EXAM:  Filed Vitals:   07/01/14 1406  BP: 132/80  Temp: 98.1 F (36.7 C)    Body mass index is 39.93 kg/(m^2).  GENERAL: vitals reviewed and listed above, alert, oriented, appears well hydrated and in no acute distress  HEENT: atraumatic, conjunttiva clear, no obvious abnormalities on inspection of external nose and ears  NECK: no obvious masses on inspection  LUNGS: clear to auscultation bilaterally, no wheezes, rales or rhonchi, good air movement  CV: HRRR, no peripheral edema  MS: moves all extremities without noticeable abnormality  PSYCH: pleasant and cooperative, no obvious depression or anxiety  ASSESSMENT AND PLAN:  Discussed the following assessment and plan  1. Encounter to establish care - Follow up in November for complete physical.  - Follow up sooner if needed.   2. Attention deficit hyperactivity disorder (ADHD), unspecified ADHD type - Refilled Strattera 60mg  x 90 days, no refills -  Follow up as needed.     Encounter to establish care  Attention deficit hyperactivity disorder (ADHD), unspecified ADHD type -We reviewed the PMH, PSH, FH, SH, Meds and Allergies. -We provided refills for any medications we will prescribe as needed. -We addressed current concerns per orders and patient instructions. -We have advised patient to follow up per instructions below. -Patient advised to return or notify a provider immediately if symptoms worsen or persist or new concerns arise.  Patient Instructions  I sent a  prescription to the pharmacy for Strattera 60 mg. Take as directed. Follow up with me in November for a complete physical. It was great seeing you again.      [employer]

## 2015-01-07 ENCOUNTER — Ambulatory Visit: Payer: Commercial Managed Care - PPO | Admitting: Adult Health

## 2017-04-01 ENCOUNTER — Other Ambulatory Visit: Payer: Self-pay | Admitting: Otolaryngology

## 2017-04-01 DIAGNOSIS — J329 Chronic sinusitis, unspecified: Secondary | ICD-10-CM

## 2017-04-26 ENCOUNTER — Ambulatory Visit
Admission: RE | Admit: 2017-04-26 | Discharge: 2017-04-26 | Disposition: A | Discharge status: Home / Self Care | Payer: 59 | Source: Ambulatory Visit | Attending: Otolaryngology | Admitting: Otolaryngology

## 2017-04-26 DIAGNOSIS — J329 Chronic sinusitis, unspecified: Secondary | ICD-10-CM

## 2018-12-18 DIAGNOSIS — F329 Major depressive disorder, single episode, unspecified: Secondary | ICD-10-CM | POA: Diagnosis not present

## 2019-01-15 DIAGNOSIS — F329 Major depressive disorder, single episode, unspecified: Secondary | ICD-10-CM | POA: Diagnosis not present

## 2019-01-30 DIAGNOSIS — F329 Major depressive disorder, single episode, unspecified: Secondary | ICD-10-CM | POA: Diagnosis not present

## 2019-01-30 DIAGNOSIS — F900 Attention-deficit hyperactivity disorder, predominantly inattentive type: Secondary | ICD-10-CM | POA: Diagnosis not present

## 2019-01-30 MED FILL — buPROPion HCL ER (XL) 150 M: 150 | 90 days supply | Qty: 90 | Fill #0

## 2019-01-30 MED FILL — METHYLPHENIDATE ER 54 MG TA: 54 | 30 days supply | Qty: 30 | Fill #0

## 2019-01-30 MED FILL — NEOMYCIN-POLYMYXIN-HC EAR S: 3.5-10000-1 | 13 days supply | Qty: 10 | Fill #0

## 2019-01-31 DIAGNOSIS — F329 Major depressive disorder, single episode, unspecified: Secondary | ICD-10-CM | POA: Diagnosis not present

## 2019-02-16 DIAGNOSIS — F329 Major depressive disorder, single episode, unspecified: Secondary | ICD-10-CM | POA: Diagnosis not present

## 2019-03-06 DIAGNOSIS — F329 Major depressive disorder, single episode, unspecified: Secondary | ICD-10-CM | POA: Diagnosis not present

## 2019-03-08 MED FILL — METHYLPHENIDATE ER 54 MG TA: 54 | 30 days supply | Qty: 30 | Fill #0

## 2019-03-21 DIAGNOSIS — F329 Major depressive disorder, single episode, unspecified: Secondary | ICD-10-CM | POA: Diagnosis not present

## 2019-04-06 DIAGNOSIS — F329 Major depressive disorder, single episode, unspecified: Secondary | ICD-10-CM | POA: Diagnosis not present

## 2019-04-07 IMAGING — CT CT MAXILLOFACIAL W/O CM
1 series · 15 of 30 positions shown, 19 images · non-contrast
Comparison: None.

CLINICAL DATA: 36-year-old male with difficulty breathing, snoring
at night. Sinusitis.

EXAM:
CT MAXILLOFACIAL WITHOUT CONTRAST
TECHNIQUE: Multidetector CT images of the paranasal sinuses were obtained using
the standard protocol without intravenous contrast.

[Series 3: maxofacial soft · axial · 0.39mm/px · z∈[+801,+942]mm · 15 of 51 slices shown, 19 images]
[im 2/51  brain]
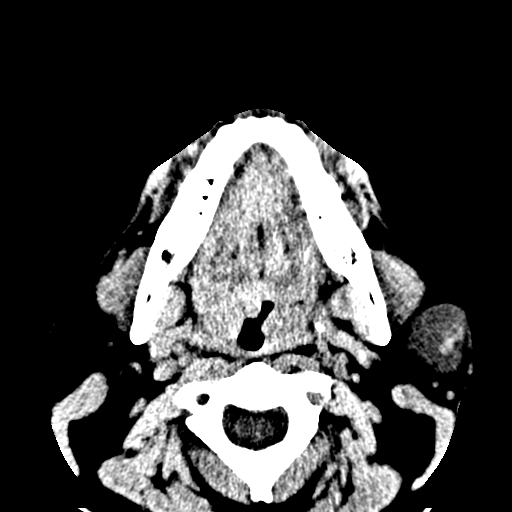
[im 2/51  bone]
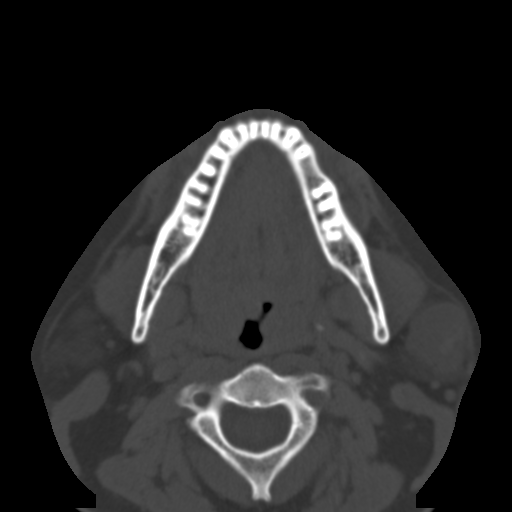
[im 6/51  bone]
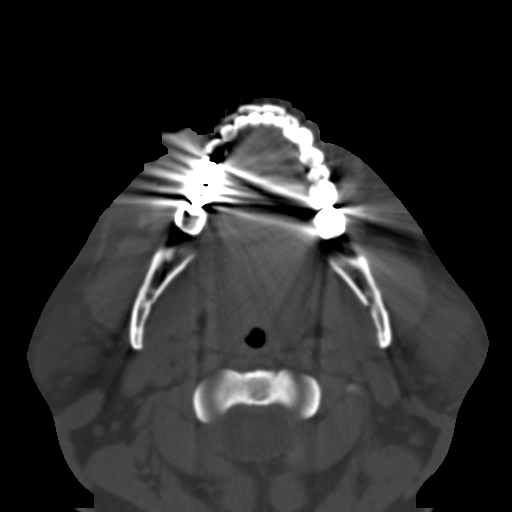
[im 9/51  bone]
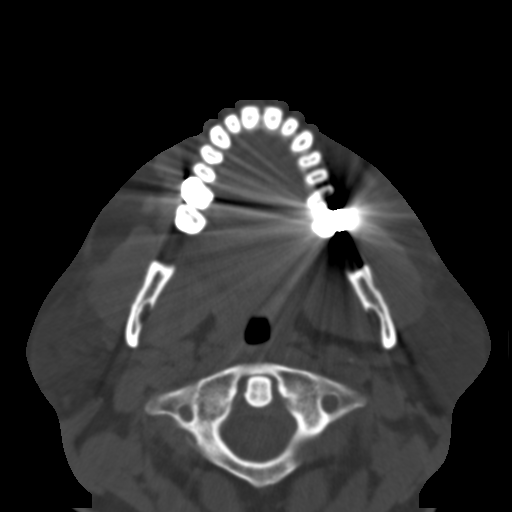
[im 13/51  bone]
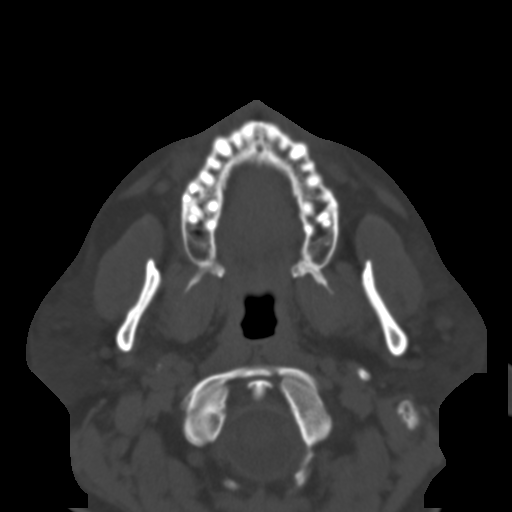
[im 16/51  brain]
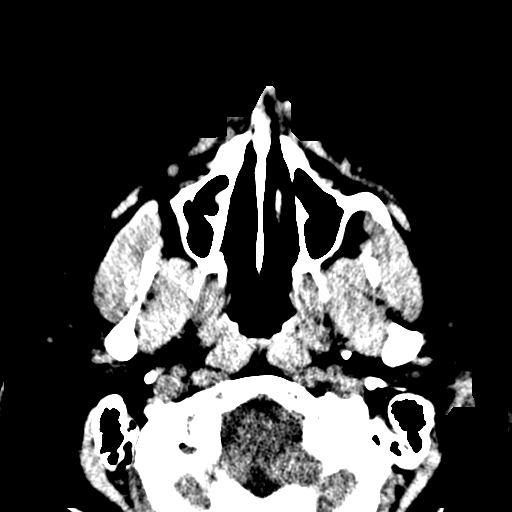
[im 16/51  bone]
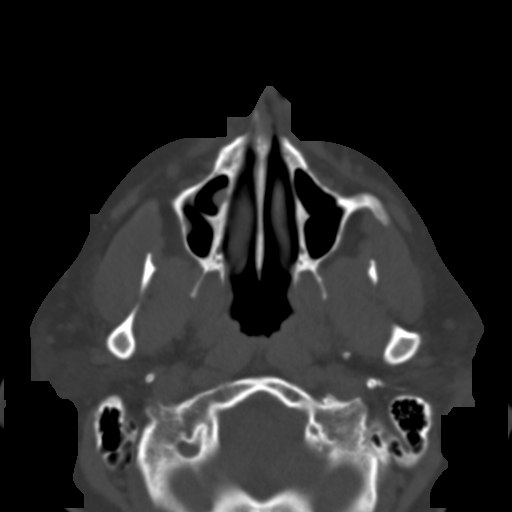
[im 19/51  bone]
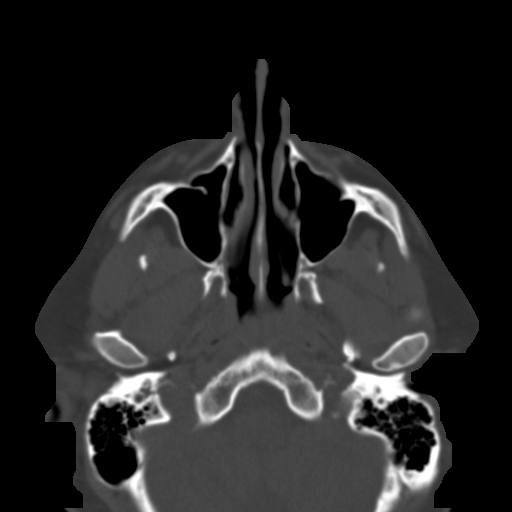
[im 23/51  bone]
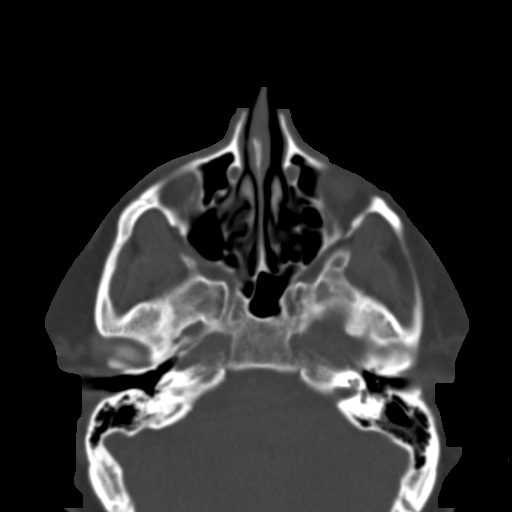
[im 26/51  bone]
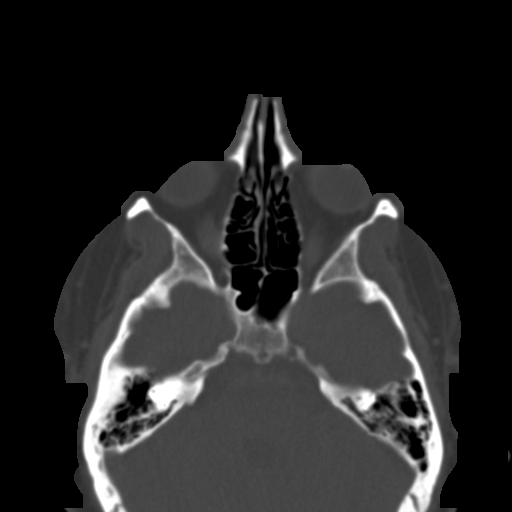
[im 28/51  brain]
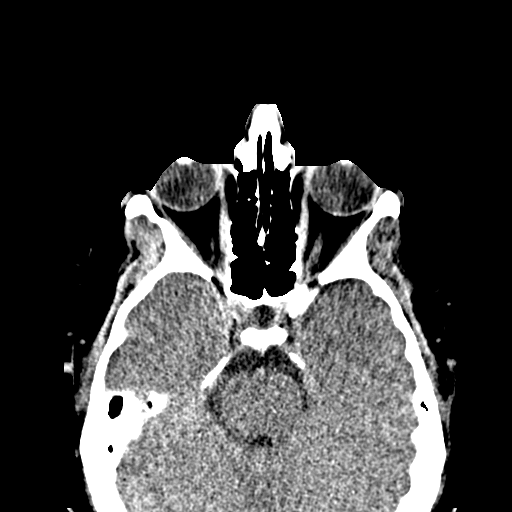
[im 28/51  bone]
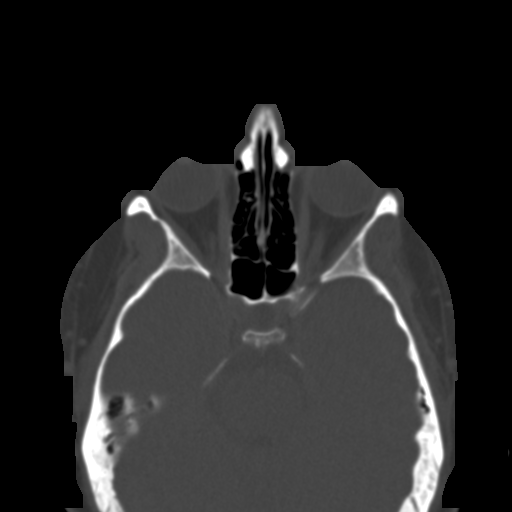
[im 32/51  bone]
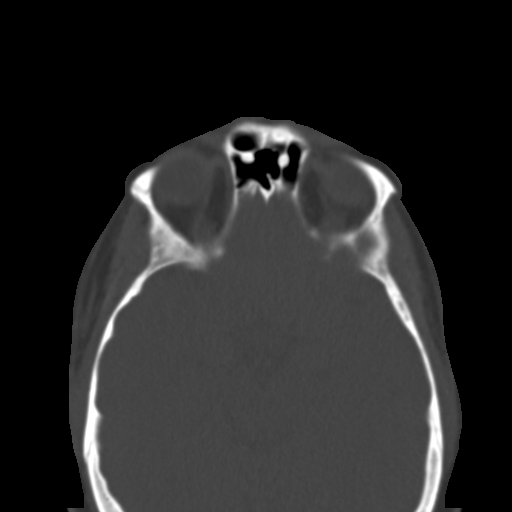
[im 35/51  bone]
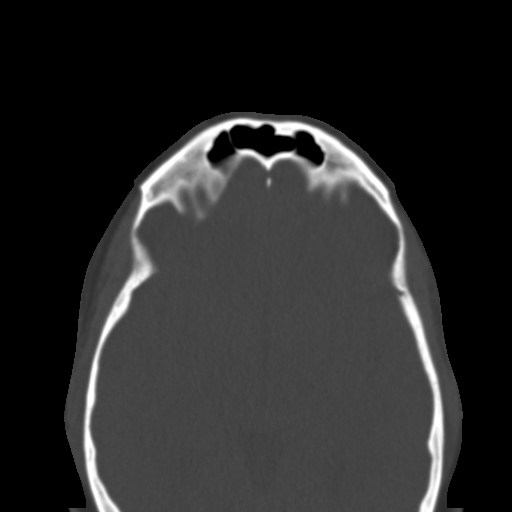
[im 38/51  bone]
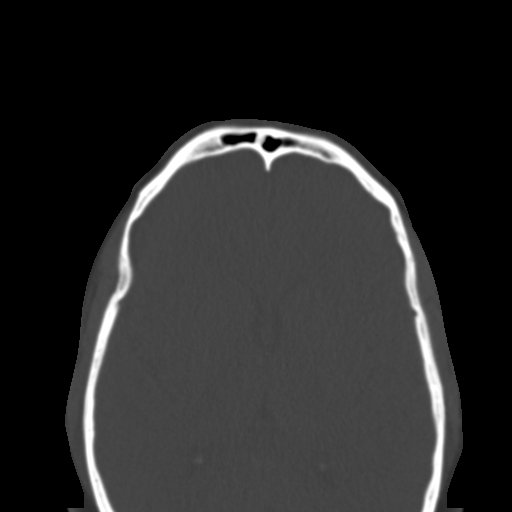
[im 42/51  brain]
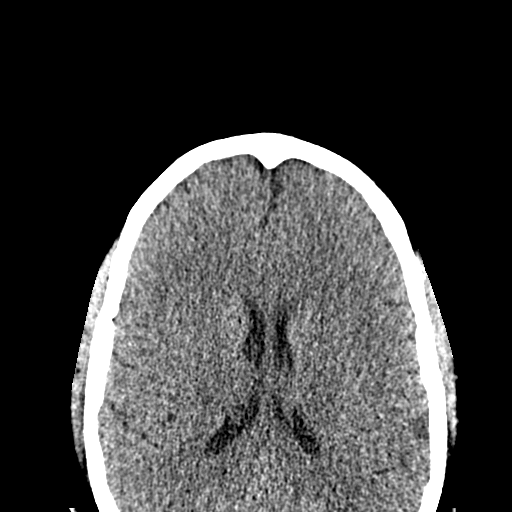
[im 42/51  bone]
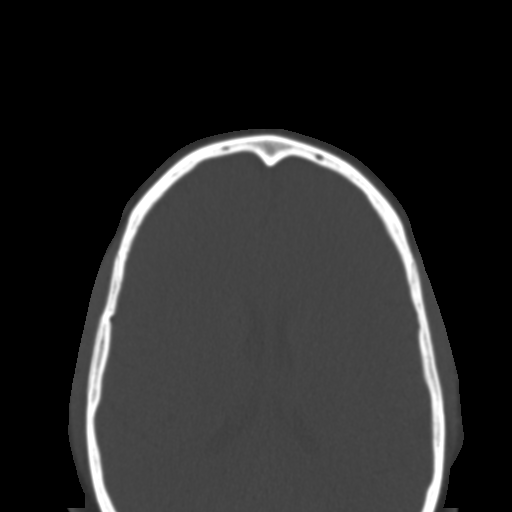
[im 45/51  bone]
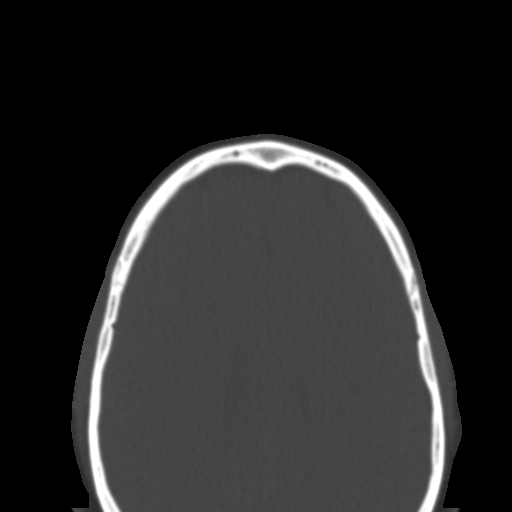
[im 49/51  bone]
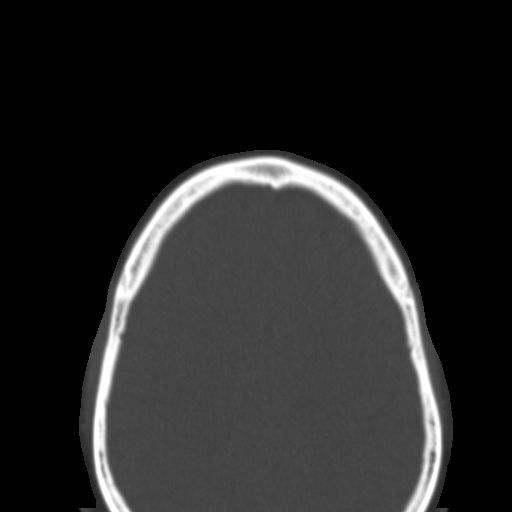

[15 of 30 positions shown; findings below may reference images not displayed]

FINDINGS: Paranasal sinuses:

Frontal: Normally aerated. Patent frontal sinus drainage pathways.

Ethmoid: Normally aerated.

Maxillary: Mild, occasionally polypoid mucosal thickening in both
maxillary sinus alveolar recesses. Marely Colbert type cell on the
right (coronal image 26). Mild sinus mucosal thickening near both
maxillary ostia.

Sphenoid: Normally aerated. Patent sphenoethmoidal recesses.

Right ostiomeatal unit: Patent despite mild mucosal thickening
(coronal image 26).

Left ostiomeatal unit: Patent, coronal image 26.

Nasal passages: Nasal septum is intact with only minimal septal
deviation. Nasal cavity mucosa appears within normal limits.

Anatomy:

Bilateral anterior ethmoidal artery position suspected on coronal
image 30.

Keros type 2 olfactory fossa.

There is Ourari Tiger type cell of the right maxillary sinus
(coronal image 26).

Other: Visualized noncontrast brain parenchyma is normal. Dominant
appearing distal left vertebral artery (normal variant).

Visualized orbit soft tissues are within normal limits. Visualized
scalp soft tissues are within normal limits. Negative visible
noncontrast deep soft tissue spaces of the face. Mild symmetric
appearing palatine tonsil enlargement (series 3, image 1). Negative
visible parapharyngeal and retropharyngeal spaces.

Bilateral tympanic cavities and mastoids are clear.

No acute osseous abnormality identified.
IMPRESSION: 1. Mild maxillary sinus mucosal thickening, most pronounced in the
alveolar recesses. Bilateral OMCs remain patent. Small right side
Yi type cell noted.
2. Otherwise normally aerated paranasal sinuses.
3. Nasal cavity and visible pharynx are within normal limits.

## 2019-04-16 MED FILL — METHYLPHENIDATE ER 54 MG TA: 54 | 30 days supply | Qty: 30 | Fill #0

## 2019-04-18 DIAGNOSIS — F329 Major depressive disorder, single episode, unspecified: Secondary | ICD-10-CM | POA: Diagnosis not present

## 2019-04-20 DIAGNOSIS — D3132 Benign neoplasm of left choroid: Secondary | ICD-10-CM | POA: Diagnosis not present

## 2019-04-20 DIAGNOSIS — H5213 Myopia, bilateral: Secondary | ICD-10-CM | POA: Diagnosis not present

## 2019-05-02 ENCOUNTER — Other Ambulatory Visit (HOSPITAL_BASED_OUTPATIENT_CLINIC_OR_DEPARTMENT_OTHER): Payer: Self-pay | Admitting: Family Medicine

## 2019-05-02 DIAGNOSIS — F900 Attention-deficit hyperactivity disorder, predominantly inattentive type: Secondary | ICD-10-CM | POA: Diagnosis not present

## 2019-05-02 DIAGNOSIS — F325 Major depressive disorder, single episode, in full remission: Secondary | ICD-10-CM | POA: Diagnosis not present

## 2019-05-02 MED FILL — BUPROPION HCL XL 300 MG TAB: 300 | 90 days supply | Qty: 90 | Fill #0

## 2019-05-16 MED FILL — CONCERTA 54 MG TABLET ER: 54 | 30 days supply | Qty: 30 | Fill #0

## 2019-05-21 DIAGNOSIS — F329 Major depressive disorder, single episode, unspecified: Secondary | ICD-10-CM | POA: Diagnosis not present

## 2019-06-08 DIAGNOSIS — F329 Major depressive disorder, single episode, unspecified: Secondary | ICD-10-CM | POA: Diagnosis not present

## 2019-07-02 DIAGNOSIS — F329 Major depressive disorder, single episode, unspecified: Secondary | ICD-10-CM | POA: Diagnosis not present

## 2019-07-16 ENCOUNTER — Ambulatory Visit (HOSPITAL_COMMUNITY)
Admission: EM | Admit: 2019-07-16 | Discharge: 2019-07-16 | Disposition: A | Discharge status: Home / Self Care | Payer: 59 | Attending: Internal Medicine | Admitting: Internal Medicine

## 2019-07-16 ENCOUNTER — Other Ambulatory Visit: Payer: Self-pay

## 2019-07-16 DIAGNOSIS — K219 Gastro-esophageal reflux disease without esophagitis: Secondary | ICD-10-CM | POA: Diagnosis not present

## 2019-07-16 DIAGNOSIS — Z8249 Family history of ischemic heart disease and other diseases of the circulatory system: Secondary | ICD-10-CM | POA: Insufficient documentation

## 2019-07-16 DIAGNOSIS — Z8379 Family history of other diseases of the digestive system: Secondary | ICD-10-CM | POA: Insufficient documentation

## 2019-07-16 DIAGNOSIS — Z79899 Other long term (current) drug therapy: Secondary | ICD-10-CM | POA: Insufficient documentation

## 2019-07-16 DIAGNOSIS — R5383 Other fatigue: Secondary | ICD-10-CM | POA: Diagnosis not present

## 2019-07-16 DIAGNOSIS — F909 Attention-deficit hyperactivity disorder, unspecified type: Secondary | ICD-10-CM | POA: Diagnosis not present

## 2019-07-16 DIAGNOSIS — Z91013 Allergy to seafood: Secondary | ICD-10-CM | POA: Diagnosis not present

## 2019-07-16 DIAGNOSIS — Z20822 Contact with and (suspected) exposure to covid-19: Secondary | ICD-10-CM | POA: Insufficient documentation

## 2019-07-16 DIAGNOSIS — I10 Essential (primary) hypertension: Secondary | ICD-10-CM | POA: Diagnosis not present

## 2019-07-16 DIAGNOSIS — Z881 Allergy status to other antibiotic agents status: Secondary | ICD-10-CM | POA: Diagnosis not present

## 2019-07-16 LAB — COMPREHENSIVE METABOLIC PANEL
ALT: 34 U/L (ref 0–44)
AST: 19 U/L (ref 15–41)
Albumin: 4.1 g/dL (ref 3.5–5.0)
Alkaline Phosphatase: 54 U/L (ref 38–126)
Anion gap: 10 (ref 5–15)
BUN: 8 mg/dL (ref 6–20)
CO2: 23 mmol/L (ref 22–32)
Calcium: 9.2 mg/dL (ref 8.9–10.3)
Chloride: 106 mmol/L (ref 98–111)
Creatinine, Ser: 0.82 mg/dL (ref 0.61–1.24)
GFR calc Af Amer: >60 mL/min (ref >60)
GFR calc non Af Amer: >60 mL/min (ref >60)
Glucose, Bld: 119 mg/dL — ABNORMAL HIGH (ref 70–99)
Potassium: 4.2 mmol/L (ref 3.5–5.1)
Sodium: 139 mmol/L (ref 135–145)
Total Bilirubin: 0.8 mg/dL (ref 0.3–1.2)
Total Protein: 7.2 g/dL (ref 6.5–8.1)

## 2019-07-16 LAB — CBC WITH DIFFERENTIAL/PLATELET
Abs Immature Granulocytes: 0.02 10*3/uL (ref 0.00–0.07)
Basophils Absolute: 0 10*3/uL (ref 0.0–0.1)
Basophils Relative: 0 %
Eosinophils Absolute: 0.1 10*3/uL (ref 0.0–0.5)
Eosinophils Relative: 2 %
HCT: 45.6 % (ref 39.0–52.0)
Hemoglobin: 15.6 g/dL (ref 13.0–17.0)
Immature Granulocytes: 0 %
Lymphocytes Relative: 25 %
Lymphs Abs: 2 10*3/uL (ref 0.7–4.0)
MCH: 30.1 pg (ref 26.0–34.0)
MCHC: 34.2 g/dL (ref 30.0–36.0)
MCV: 88 fL (ref 80.0–100.0)
Monocytes Absolute: 0.7 10*3/uL (ref 0.1–1.0)
Monocytes Relative: 9 %
Neutro Abs: 5.1 10*3/uL (ref 1.7–7.7)
Neutrophils Relative %: 64 %
Platelets: 243 10*3/uL (ref 150–400)
RBC: 5.18 MIL/uL (ref 4.22–5.81)
RDW: 11.9 % (ref 11.5–15.5)
WBC: 8 10*3/uL (ref 4.0–10.5)
nRBC: 0 % (ref 0.0–0.2)

## 2019-07-16 LAB — TSH: TSH: 0.775 u[IU]/mL (ref 0.350–4.500)

## 2019-07-16 MED ORDER — METHOCARBAMOL 500 MG PO TABS
500.0000 mg | ORAL_TABLET | Freq: Two times a day (BID) | ORAL | 0 refills | Status: DC | PRN
Start: 2019-07-16 — End: 2019-08-14

## 2019-07-16 MED ORDER — ACETAMINOPHEN 500 MG PO TABS: 500.0000 mg | ORAL_TABLET | Freq: Four times a day (QID) | ORAL | 0 refills | Status: AC | PRN

## 2019-07-16 NOTE — ED Triage Notes (Signed)
Pt c/o feeling tired all the time, runny nose, cough x 1 month. Pt c/o joint pain and muscle stiffness x 1 week.

## 2019-07-17 LAB — SARS CORONAVIRUS 2 (TAT 6-24 HRS): SARS Coronavirus 2: NEGATIVE

## 2019-07-18 LAB — VITAMIN D 25 HYDROXY (VIT D DEFICIENCY, FRACTURES): Vit D, 25-Hydroxy: 22.2 ng/mL — ABNORMAL LOW (ref 30–100)

## 2019-07-18 NOTE — ED Provider Notes (Signed)
[facility]    CSN: [id number] Arrival date & time: 07/16/19  1901      History   Chief Complaint Chief Complaint  Patient presents with  . Fatigue    HPI Phillip Roach is a 39 y.o. male comes to the urgent care with complaints of runny nose, persistent cough and fatigue for 1 month.  She complains of some joint pains and muscle stiffness as well.  No fever or chills.  No sick contacts.  No nausea or vomiting.  No weight loss.  Patient denies any headaches.  Cough is not productive of any sputum.  He also has some generalized body aches with muscle stiffness.   HPI  Past Medical History:  Diagnosis Date  . ADHD (attention deficit hyperactivity disorder)   . GERD (gastroesophageal reflux disease)   . Hypertension     Patient Active Problem List   Diagnosis Date Noted  . Obesity (BMI 30-39.9) 09/19/2012  . GERD 01/11/2008  . Attention deficit hyperactivity disorder (ADHD) 08/29/2007    No past surgical history on file.     Home Medications    Prior to Admission medications   Medication Sig Start Date End Date Taking? Authorizing Provider  buPROPion (WELLBUTRIN XL) 300 MG 24 hr tablet Take 300 mg by mouth daily.   Yes [provider]  ibuprofen (ADVIL,MOTRIN) 200 MG tablet Take 200 mg by mouth every 6 (six) hours as needed for pain.   Yes [provider]  loratadine (CLARITIN) 10 MG tablet Take 10 mg by mouth daily.   Yes [provider]  methylphenidate 54 MG PO CR tablet Take 54 mg by mouth every morning.   Yes [provider]  triamcinolone (NASACORT ALLERGY 24HR) 55 MCG/ACT AERO nasal inhaler Place 2 sprays into the nose daily.   Yes [provider]  acetaminophen (TYLENOL) 500 MG tablet Take 1 tablet (500 mg total) by mouth every 6 (six) hours as needed. 07/16/19   Burnadette Baskett, Myrene Galas, MD  methocarbamol (ROBAXIN) 500 MG tablet Take 1 tablet (500 mg total) by mouth 2 (two) times daily as needed for muscle  spasms. 07/16/19   LampteyMyrene Galas, MD    Family History Family History  Problem Relation Age of Onset  . GER disease Mother   . Anxiety disorder Father   . Hypertension Father     Social History Social History   Tobacco Use  . Smoking status: Never Smoker  . Smokeless tobacco: Never Used  Substance Use Topics  . Alcohol use: Yes    Comment: couple times a month  . Drug use: No     Allergies   Shellfish allergy and Keflex [cephalexin]   Review of Systems Review of Systems  Constitutional: Positive for fatigue. Negative for activity change and fever.  HENT: Negative.  Negative for postnasal drip, sinus pressure and sinus pain.   Respiratory: Positive for cough. Negative for chest tightness and shortness of breath.   Cardiovascular: Negative for chest pain.  Gastrointestinal: Negative.   Genitourinary: Negative.   Musculoskeletal: Positive for arthralgias and myalgias. Negative for neck pain.  Skin: Negative.  Negative for rash.  Neurological: Negative for dizziness, light-headedness and headaches.     Physical Exam Triage Vital Signs ED Triage Vitals  Enc Vitals Group     BP 07/16/19 1928 (!) 155/101     Pulse Rate 07/16/19 1928 84     Resp 07/16/19 1928 16     Temp 07/16/19 1928 98.8 F (37.1  C)     Temp src --      SpO2 07/16/19 1928 99 %     Weight --      Height --      Head Circumference --      Peak Flow --      Pain Score 07/16/19 1929 2     Pain Loc --      Pain Edu? --      Excl. in [place]? --    No data found.  Updated Vital Signs BP (!) 155/101   Pulse 84   Temp 98.8 F (37.1 C)   Resp 16   SpO2 99%   Visual Acuity Right Eye Distance:   Left Eye Distance:   Bilateral Distance:    Right Eye Near:   Left Eye Near:    Bilateral Near:     Physical Exam Vitals and nursing note reviewed.  Constitutional:      General: He is not in acute distress.    Appearance: Normal appearance. He is not ill-appearing.  Cardiovascular:     Rate  and Rhythm: Normal rate and regular rhythm.     Pulses: Normal pulses.     Heart sounds: Normal heart sounds.  Pulmonary:     Effort: Pulmonary effort is normal.     Breath sounds: Normal breath sounds.  Abdominal:     General: Bowel sounds are normal.     Tenderness: There is no abdominal tenderness. There is no guarding or rebound.  Musculoskeletal:        General: No tenderness or signs of injury. Normal range of motion.  Skin:    Capillary Refill: Capillary refill takes less than 2 seconds.  Neurological:     General: No focal deficit present.     Mental Status: He is alert and oriented to person, place, and time.      UC Treatments / Results  Labs (all labs ordered are listed, but only abnormal results are displayed) Labs Reviewed  COMPREHENSIVE METABOLIC PANEL - Abnormal; Notable for the following components:      Result Value   Glucose, Bld 119 (*)    All other components within normal limits  VITAMIN D 25 HYDROXY (VIT D DEFICIENCY, FRACTURES) - Abnormal; Notable for the following components:   Vit D, 25-Hydroxy 22.2 (*)    All other components within normal limits  SARS CORONAVIRUS 2 (TAT 6-24 HRS)  CBC WITH DIFFERENTIAL/PLATELET  TSH    EKG   Radiology No results found.  Procedures Procedures (including critical care time)  Medications Ordered in UC Medications - No data to display  Initial Impression / Assessment and Plan / UC Course  I have reviewed the triage vital signs and the nursing notes.  Pertinent labs & imaging results that were available during my care of the patient were reviewed by me and considered in my medical decision making (see chart for details).     1.  Fatigue: CBC, BMP, TSH, vitamin D level Robaxin as needed for muscle spasms Tylenol as needed for pain/fever Based on the lab work further recommendations will be made. Return precautions given  Final Clinical Impressions(s) / UC Diagnoses   Final diagnoses:  Fatigue,  unspecified type   Discharge Instructions   None    ED Prescriptions    Medication Sig Dispense Auth. Provider   methocarbamol (ROBAXIN) 500 MG tablet Take 1 tablet (500 mg total) by mouth 2 (two) times daily as needed for muscle spasms. 30 tablet  Chase Picket, MD   acetaminophen (TYLENOL) 500 MG tablet Take 1 tablet (500 mg total) by mouth every 6 (six) hours as needed. 30 tablet Madgie Dhaliwal, Myrene Galas, MD     PDMP not reviewed this encounter.   Chase Picket, MD 07/18/19 1336

## 2019-07-20 DIAGNOSIS — F329 Major depressive disorder, single episode, unspecified: Secondary | ICD-10-CM | POA: Diagnosis not present

## 2019-07-30 DIAGNOSIS — F329 Major depressive disorder, single episode, unspecified: Secondary | ICD-10-CM | POA: Diagnosis not present

## 2019-08-14 ENCOUNTER — Ambulatory Visit: Payer: 59 | Admitting: Family Medicine

## 2019-08-14 ENCOUNTER — Encounter: Payer: Self-pay | Admitting: Family Medicine

## 2019-08-14 ENCOUNTER — Other Ambulatory Visit: Payer: Self-pay

## 2019-08-14 VITALS — BP 110/60 | HR 87 | Temp 97.9°F | Ht 74.0 in | Wt 332.2 lb

## 2019-08-14 DIAGNOSIS — Z6841 Body Mass Index (BMI) 40.0 and over, adult: Secondary | ICD-10-CM | POA: Diagnosis not present

## 2019-08-14 DIAGNOSIS — F900 Attention-deficit hyperactivity disorder, predominantly inattentive type: Secondary | ICD-10-CM | POA: Diagnosis not present

## 2019-08-14 DIAGNOSIS — R221 Localized swelling, mass and lump, neck: Secondary | ICD-10-CM | POA: Diagnosis not present

## 2019-08-14 DIAGNOSIS — E559 Vitamin D deficiency, unspecified: Secondary | ICD-10-CM | POA: Diagnosis not present

## 2019-08-14 DIAGNOSIS — F3341 Major depressive disorder, recurrent, in partial remission: Secondary | ICD-10-CM

## 2019-08-14 MED ORDER — BUPROPION HCL ER (XL) 300 MG PO TB24: 300.0000 mg | ORAL_TABLET | Freq: Every day | ORAL | 1 refills | Status: DC

## 2019-08-14 MED ORDER — BUPROPION HCL ER (XL) 300 MG PO TB24
300.0000 mg | ORAL_TABLET | Freq: Every day | ORAL | 1 refills | Status: DC
  Filled 2020-06-20: qty 90, 90d supply, fill #0

## 2019-08-14 NOTE — Patient Instructions (Signed)
Living With Depression Everyone experiences occasional disappointment, sadness, and loss in their lives. When you are feeling down, blue, or sad for at least 2 weeks in a row, it may mean that you have depression. Depression can affect your thoughts and feelings, relationships, daily activities, and physical health. It is caused by changes in the way your brain functions. If you receive a diagnosis of depression, your health care provider will tell you which type of depression you have and what treatment options are available to you. If you are living with depression, there are ways to help you recover from it and also ways to prevent it from coming back. How to cope with lifestyle changes Coping with stress     Stress is your body's reaction to life changes and events, both good and bad. Stressful situations may include:  Getting married.  The death of a spouse.  Losing a job.  Retiring.  Having a baby. Stress can last just a few hours or it can be ongoing. Stress can play a major role in depression, so it is important to learn both how to cope with stress and how to think about it differently. Talk with your health care provider or a counselor if you would like to learn more about stress reduction. He or she may suggest some stress reduction techniques, such as:  Music therapy. This can include creating music or listening to music. Choose music that you enjoy and that inspires you.  Mindfulness-based meditation. This kind of meditation can be done while sitting or walking. It involves being aware of your normal breaths, rather than trying to control your breathing.  Centering prayer. This is a kind of meditation that involves focusing on a spiritual word or phrase. Choose a word, phrase, or sacred image that is meaningful to you and that brings you peace.  Deep breathing. To do this, expand your stomach and inhale slowly through your nose. Hold your breath for 3-5 seconds, then exhale  slowly, allowing your stomach muscles to relax.  Muscle relaxation. This involves intentionally tensing muscles then relaxing them. Choose a stress reduction technique that fits your lifestyle and personality. Stress reduction techniques take time and practice to develop. Set aside 5-15 minutes a day to do them. Therapists can offer training in these techniques. The training may be covered by some insurance plans. Other things you can do to manage stress include:  Keeping a stress diary. This can help you learn what triggers your stress and ways to control your response.  Understanding what your limits are and saying no to requests or events that lead to a schedule that is too full.  Thinking about how you respond to certain situations. You may not be able to control everything, but you can control how you react.  Adding humor to your life by watching funny films or TV shows.  Making time for activities that help you relax and not feeling guilty about spending your time this way.  Medicines Your health care provider may suggest certain medicines if he or she feels that they will help improve your condition. Avoid using alcohol and other substances that may prevent your medicines from working properly (may interact). It is also important to:  Talk with your pharmacist or health care provider about all the medicines that you take, their possible side effects, and what medicines are safe to take together.  Make it your goal to take part in all treatment decisions (shared decision-making). This includes giving input on   the side effects of medicines. It is best if shared decision-making with your health care provider is part of your total treatment plan. If your health care provider prescribes a medicine, you may not notice the full benefits of it for 4-8 weeks. Most people who are treated for depression need to be on medicine for at least 6-12 months after they feel better. If you are taking  medicines as part of your treatment, do not stop taking medicines without first talking to your health care provider. You may need to have the medicine slowly decreased (tapered) over time to decrease the risk of harmful side effects. Relationships Your health care provider may suggest family therapy along with individual therapy and drug therapy. While there may not be family problems that are causing you to feel depressed, it is still important to make sure your family learns as much as they can about your mental health. Having your family's support can help make your treatment successful. How to recognize changes in your condition Everyone has a different response to treatment for depression. Recovery from major depression happens when you have not had signs of major depression for two months. This may mean that you will start to:  Have more interest in doing activities.  Feel less hopeless than you did 2 months ago.  Have more energy.  Overeat less often, or have better or improving appetite.  Have better concentration. Your health care provider will work with you to decide the next steps in your recovery. It is also important to recognize when your condition is getting worse. Watch for these signs:  Having fatigue or low energy.  Eating too much or too little.  Sleeping too much or too little.  Feeling restless, agitated, or hopeless.  Having trouble concentrating or making decisions.  Having unexplained physical complaints.  Feeling irritable, angry, or aggressive. Get help as soon as you or your family members notice these symptoms coming back. How to get support and help from others How to talk with friends and family members about your condition  Talking to friends and family members about your condition can provide you with one way to get support and guidance. Reach out to trusted friends or family members, explain your symptoms to them, and let them know that you are  working with a health care provider to treat your depression. Financial resources Not all insurance plans cover mental health care, so it is important to check with your insurance carrier. If paying for co-pays or counseling services is a problem, search for a local or county mental health care center. They may be able to offer public mental health care services at low or no cost when you are not able to see a private health care provider. If you are taking medicine for depression, you may be able to get the generic form, which may be less expensive. Some makers of prescription medicines also offer help to patients who cannot afford the medicines they need. Follow these instructions at home:   Get the right amount and quality of sleep.  Cut down on using caffeine, tobacco, alcohol, and other potentially harmful substances.  Try to exercise, such as walking or lifting small weights.  Take over-the-counter and prescription medicines only as told by your health care provider.  Eat a healthy diet that includes plenty of vegetables, fruits, whole grains, low-fat dairy products, and lean protein. Do not eat a lot of foods that are high in solid fats, added sugars, or salt.    Keep all follow-up visits as told by your health care provider. This is important. Contact a health care provider if:  You stop taking your antidepressant medicines, and you have any of these symptoms: ? Nausea. ? Headache. ? Feeling lightheaded. ? Chills and body aches. ? Not being able to sleep (insomnia).  You or your friends and family think your depression is getting worse. Get help right away if:  You have thoughts of hurting yourself or others. If you ever feel like you may hurt yourself or others, or have thoughts about taking your own life, get help right away. You can go to your nearest emergency department or call:  Your local emergency services (911 in the [place]  A suicide crisis helpline, such as the  [facility] at (318)546-4539. This is open 24-hours a day. Summary  If you are living with depression, there are ways to help you recover from it and also ways to prevent it from coming back.  Work with your health care team to create a management plan that includes counseling, stress management techniques, and healthy lifestyle habits. This information is not intended to replace advice given to you by your health care provider. Make sure you discuss any questions you have with your health care provider. Document Revised: 06/16/2018 Document Reviewed: 01/26/2016 Elsevier Patient Education  [employer].  Obesity, Adult Obesity is the condition of having too much total body fat. Being overweight or obese means that your weight is greater than what is considered healthy for your body size. Obesity is determined by a measurement called BMI. BMI is an estimate of body fat and is calculated from height and weight. For adults, a BMI of 30 or higher is considered obese. Obesity can lead to other health concerns and major illnesses, including:  Stroke.  Coronary artery disease (CAD).  Type 2 diabetes.  Some types of cancer, including cancers of the colon, breast, uterus, and gallbladder.  Osteoarthritis.  High blood pressure (hypertension).  High cholesterol.  Sleep apnea.  Gallbladder stones.  Infertility problems. What are the causes? Common causes of this condition include:  Eating daily meals that are high in calories, sugar, and fat.  Being born with genes that may make you more likely to become obese.  Having a medical condition that causes obesity, including: ? Hypothyroidism. ? Polycystic ovarian syndrome (PCOS). ? Binge-eating disorder. ? Cushing syndrome.  Taking certain medicines, such as steroids, antidepressants, and seizure medicines.  Not being physically active (sedentary lifestyle).  Not getting enough sleep.  Drinking high  amounts of sugar-sweetened beverages, such as soft drinks. What increases the risk? The following factors may make you more likely to develop this condition:  Having a family history of obesity.  Being a woman of African American descent.  Being a man of Hispanic descent.  Living in an area with limited access to: ? Romilda Garret, recreation centers, or sidewalks. ? Healthy food choices, such as grocery stores and farmers' markets. What are the signs or symptoms? The main sign of this condition is having too much body fat. How is this diagnosed? This condition is diagnosed based on:  Your BMI. If you are an adult with a BMI of 30 or higher, you are considered obese.  Your waist circumference. This measures the distance around your waistline.  Your skinfold thickness. Your health care provider may gently pinch a fold of your skin and measure it. You may have other tests to check for underlying conditions. How is  this treated? Treatment for this condition often includes changing your lifestyle. Treatment may include some or all of the following:  Dietary changes. This may include developing a healthy meal plan.  Regular physical activity. This may include activity that causes your heart to beat faster (aerobic exercise) and strength training. Work with your health care provider to design an exercise program that works for you.  Medicine to help you lose weight if you are unable to lose 1 pound a week after 6 weeks of healthy eating and more physical activity.  Treating conditions that cause the obesity (underlying conditions).  Surgery. Surgical options may include gastric banding and gastric bypass. Surgery may be done if: ? Other treatments have not helped to improve your condition. ? You have a BMI of 40 or higher. ? You have life-threatening health problems related to obesity. Follow these instructions at home: Eating and drinking   Follow recommendations from your health care  provider about what you eat and drink. Your health care provider may advise you to: ? Limit fast food, sweets, and processed snack foods. ? Choose low-fat options, such as low-fat milk instead of whole milk. ? Eat 5 or more servings of fruits or vegetables every day. ? Eat at home more often. This gives you more control over what you eat. ? Choose healthy foods when you eat out. ? Learn to read food labels. This will help you understand how much food is considered 1 serving. ? Learn what a healthy serving size is. ? Keep low-fat snacks available. ? Limit sugary drinks, such as soda, fruit juice, sweetened iced tea, and flavored milk.  Drink enough water to keep your urine pale yellow.  Do not follow a fad diet. Fad diets can be unhealthy and even dangerous. Physical activity  Exercise regularly, as told by your health care provider. ? Most adults should get up to 150 minutes of moderate-intensity exercise every week. ? Ask your health care provider what types of exercise are safe for you and how often you should exercise.  Warm up and stretch before being active.  Cool down and stretch after being active.  Rest between periods of activity. Lifestyle  Work with your health care provider and a dietitian to set a weight-loss goal that is healthy and reasonable for you.  Limit your screen time.  Find ways to reward yourself that do not involve food.  Do not drink alcohol if: ? Your health care provider tells you not to drink. ? You are pregnant, may be pregnant, or are planning to become pregnant.  If you drink alcohol: ? Limit how much you use to:  0-1 drink a day for women.  0-2 drinks a day for men. ? Be aware of how much alcohol is in your drink. In the [place] one drink equals one 12 oz bottle of beer (355 mL), one 5 oz glass of wine (148 mL), or one 1 oz glass of hard liquor (44 mL). General instructions  Keep a weight-loss journal to keep track of the food you eat and  how much exercise you get.  Take over-the-counter and prescription medicines only as told by your health care provider.  Take vitamins and supplements only as told by your health care provider.  Consider joining a support group. Your health care provider may be able to recommend a support group.  Keep all follow-up visits as told by your health care provider. This is important. Contact a health care provider if:  You  are unable to meet your weight loss goal after 6 weeks of dietary and lifestyle changes. Get help right away if you are having:  Trouble breathing.  Suicidal thoughts or behaviors. Summary  Obesity is the condition of having too much total body fat.  Being overweight or obese means that your weight is greater than what is considered healthy for your body size.  Work with your health care provider and a dietitian to set a weight-loss goal that is healthy and reasonable for you.  Exercise regularly, as told by your health care provider. Ask your health care provider what types of exercise are safe for you and how often you should exercise. This information is not intended to replace advice given to you by your health care provider. Make sure you discuss any questions you have with your health care provider. Document Revised: 10/27/2017 Document Reviewed: 10/27/2017 Elsevier Patient Education  2020 [employer].

## 2019-08-14 NOTE — Progress Notes (Addendum)
New Patient Office Visit  Subjective:  Patient ID: Phillip Roach, male    DOB: 08-15-1980  Age: 39 y.o. MRN: [id number]  CC:  Chief Complaint  Patient presents with  . New Patient (Initial Visit)    Patient is here today as a new patient to establish care. Per ED record on 5.10.21 his Vit-D was 22.2. He has been taking Vit-D 2,000 IU qd. He is currently fasting.  . ADHD    He is wanting to be seen for his ADHD. He has been treated successfully with Methylphenidate ER 54mg .  He has not taken this in some time as it was sent to a pharmacy that his insurance does not cover and the prescriber refused to resend it to a different pharmacy.  He made this appointment today prior to all of that transpiring.    HPI Phillip Roach presents for establishment of care and follow-up of his depression with attention deficit.  Had taken Strattera for years.  Was then converted over to methylphenidate.  Depression was diagnosed and he has been taking Wellbutrin for over a year now with good result.  He had been out of his methylphenidate over the last month.  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  Attention deficit symptoms controlled well currently with the Wellbutrin.  Longstanding history of obesity since high school.  Methylphenidate had helped some with appetite suppression.  He has had a mass adjacent to his left mandible for some time now.  Possibly since high school.  Seems to be enlarging slowly.  Currently taking vitamin D supplementation for vitamin D deficiency.  Past Medical History:  Diagnosis Date  . ADHD (attention deficit hyperactivity disorder)   . GERD (gastroesophageal reflux disease)   . Hypertension     History reviewed. No pertinent surgical history.  Family History  Problem Relation Age of Onset  . GER disease Mother   . Anxiety disorder Father   . Hypertension Father     Social History   Socioeconomic History   . Marital status: Married    Spouse name: Not on file  . Number of children: Not on file  . Years of education: Not on file  . Highest education level: Not on file  Occupational History  . Not on file  Tobacco Use  . Smoking status: Never Smoker  . Smokeless tobacco: Never Used  Substance and Sexual Activity  . Alcohol use: Yes    Comment: couple times a month  . Drug use: No  . Sexual activity: Not on file  Other Topics Concern  . Not on file  Social History Narrative   Married and has baby on the way    Works for [employer] [place] Strain:   . Difficulty of Paying Living Expenses:   Food Insecurity:   . Worried About Charity fundraiser in the Last Year:   . Arboriculturist in the Last Year:   Transportation Needs:   . Film/video editor (Medical):   [name] Lack of Transportation (Non-Medical):   Physical Activity:   . Days of Exercise per Week:   . Minutes of Exercise per Session:   Stress:   . Feeling of Stress :   Social Connections:   . Frequency of Communication with Friends and Family:   . Frequency of Social  Gatherings with Friends and Family:   . Attends Religious Services:   . Active Member of Clubs or Organizations:   . Attends Archivist Meetings:   [name] Marital Status:   Intimate Partner Violence:   . Fear of Current or Ex-Partner:   . Emotionally Abused:   [name] Physically Abused:   . Sexually Abused:     ROS Review of Systems  Constitutional: Negative.   HENT: Negative.   Respiratory: Negative.   Cardiovascular: Negative.   Gastrointestinal: Negative.   Genitourinary: Negative.   Neurological: Negative for tremors and speech difficulty.  Hematological: Does not bruise/bleed easily.   Depression screen [facility] 2/9 08/14/2019 08/14/2019  Decreased Interest 1 0  Down, Depressed, Hopeless 0 0  PHQ - 2 Score 1 0  Altered sleeping 2 -  Tired, decreased energy 2 -  Change in appetite  3 -  Feeling bad or failure about yourself  1 -  Trouble concentrating 1 -  Moving slowly or fidgety/restless 1 -  Suicidal thoughts 0 -  PHQ-9 Score 11 -  Difficult doing work/chores Somewhat difficult -    Objective:   Today's Vitals: BP 110/60 (BP Location: Left Arm, Patient Position: Sitting, Cuff Size: Large)   Pulse 87   Temp 97.9 F (36.6 C) (Temporal)   Ht 6\' 2"  (1.88 m)   Wt (!) 332 lb 3.2 oz (150.7 kg)   SpO2 96%   BMI 42.65 kg/m   Physical Exam Constitutional:      General: He is not in acute distress.    Appearance: Normal appearance. He is obese. He is not ill-appearing, toxic-appearing or diaphoretic.  HENT:     Head: Normocephalic and atraumatic.     Right Ear: Tympanic membrane, ear canal and external ear normal.     Left Ear: Tympanic membrane, ear canal and external ear normal.     Nose: Nose normal.  Eyes:     General: No scleral icterus.       Right eye: No discharge.        Left eye: No discharge.     Extraocular Movements: Extraocular movements intact.     Conjunctiva/sclera: Conjunctivae normal.     Pupils: Pupils are equal, round, and reactive to light.  Neck:   Cardiovascular:     Rate and Rhythm: Normal rate and regular rhythm.     Pulses: Normal pulses.     Heart sounds: Normal heart sounds.  Pulmonary:     Effort: Pulmonary effort is normal.     Breath sounds: Normal breath sounds.  Abdominal:     General: Bowel sounds are normal.  Musculoskeletal:     Cervical back: Neck supple. No rigidity or tenderness.  Skin:    General: Skin is warm and dry.  Neurological:     Mental Status: He is alert.  Psychiatric:        Mood and Affect: Mood normal.        Behavior: Behavior normal.     Assessment & Plan:   Problem List Items Addressed This Visit      Other   Attention deficit hyperactivity disorder (ADHD), predominantly inattentive type   Mass of neck - Primary   Relevant Orders   US Soft Tissue Head/Neck (NON-THYROID)  (Completed)   Ambulatory referral to ENT   Recurrent major depressive disorder, in partial remission (HCC)   Relevant Medications   buPROPion (WELLBUTRIN XL) 300 MG 24 hr tablet   Vitamin D deficiency   Class 3  severe obesity due to excess calories with body mass index (BMI) of 40.0 to 44.9 in adult [facility])   Relevant Orders   Amb Ref to Medical Weight Management      Outpatient Encounter Medications as of 08/14/2019  Medication Sig  . acetaminophen (TYLENOL) 500 MG tablet Take 1 tablet (500 mg total) by mouth every 6 (six) hours as needed.  [name] buPROPion (WELLBUTRIN XL) 300 MG 24 hr tablet Take 1 tablet (300 mg total) by mouth daily.  [name] ibuprofen (ADVIL,MOTRIN) 200 MG tablet Take 200 mg by mouth every 6 (six) hours as needed for pain.  [name] loratadine (CLARITIN) 10 MG tablet Take 10 mg by mouth daily.  [name] triamcinolone (NASACORT ALLERGY 24HR) 55 MCG/ACT AERO nasal inhaler Place 2 sprays into the nose daily.  . [DISCONTINUED] buPROPion (WELLBUTRIN XL) 300 MG 24 hr tablet Take 300 mg by mouth daily.  . [DISCONTINUED] buPROPion (WELLBUTRIN XL) 300 MG 24 hr tablet Take 1 tablet (300 mg total) by mouth daily.  . methylphenidate 54 MG PO CR tablet Take 54 mg by mouth every morning.  . [DISCONTINUED] methocarbamol (ROBAXIN) 500 MG tablet Take 1 tablet (500 mg total) by mouth 2 (two) times daily as needed for muscle spasms.   No facility-administered encounter medications on file as of 08/14/2019.    Follow-up: Return in about 6 months (around 02/13/2020).   [name], MD

## 2019-08-17 ENCOUNTER — Ambulatory Visit (HOSPITAL_BASED_OUTPATIENT_CLINIC_OR_DEPARTMENT_OTHER)
Admission: RE | Admit: 2019-08-17 | Discharge: 2019-08-17 | Disposition: A | Discharge status: Home / Self Care | Payer: 59 | Source: Ambulatory Visit | Attending: Family Medicine | Admitting: Family Medicine

## 2019-08-17 ENCOUNTER — Other Ambulatory Visit: Payer: Self-pay

## 2019-08-17 DIAGNOSIS — R221 Localized swelling, mass and lump, neck: Secondary | ICD-10-CM | POA: Insufficient documentation

## 2019-08-20 NOTE — Addendum Note (Signed)
Addended by: Jon Billings on: 08/20/2019 03:04 PM   Modules accepted: Orders

## 2019-08-27 DIAGNOSIS — F329 Major depressive disorder, single episode, unspecified: Secondary | ICD-10-CM | POA: Diagnosis not present

## 2019-08-29 DIAGNOSIS — M62838 Other muscle spasm: Secondary | ICD-10-CM | POA: Diagnosis not present

## 2019-08-29 DIAGNOSIS — M9902 Segmental and somatic dysfunction of thoracic region: Secondary | ICD-10-CM | POA: Diagnosis not present

## 2019-08-29 DIAGNOSIS — M546 Pain in thoracic spine: Secondary | ICD-10-CM | POA: Diagnosis not present

## 2019-08-29 DIAGNOSIS — M9903 Segmental and somatic dysfunction of lumbar region: Secondary | ICD-10-CM | POA: Diagnosis not present

## 2019-08-29 DIAGNOSIS — M545 Low back pain: Secondary | ICD-10-CM | POA: Diagnosis not present

## 2019-08-29 DIAGNOSIS — M542 Cervicalgia: Secondary | ICD-10-CM | POA: Diagnosis not present

## 2019-08-29 DIAGNOSIS — M62830 Muscle spasm of back: Secondary | ICD-10-CM | POA: Diagnosis not present

## 2019-08-29 DIAGNOSIS — M9901 Segmental and somatic dysfunction of cervical region: Secondary | ICD-10-CM | POA: Diagnosis not present

## 2019-09-05 DIAGNOSIS — M545 Low back pain: Secondary | ICD-10-CM | POA: Diagnosis not present

## 2019-09-05 DIAGNOSIS — M542 Cervicalgia: Secondary | ICD-10-CM | POA: Diagnosis not present

## 2019-09-05 DIAGNOSIS — M62830 Muscle spasm of back: Secondary | ICD-10-CM | POA: Diagnosis not present

## 2019-09-05 DIAGNOSIS — M546 Pain in thoracic spine: Secondary | ICD-10-CM | POA: Diagnosis not present

## 2019-09-05 DIAGNOSIS — M9902 Segmental and somatic dysfunction of thoracic region: Secondary | ICD-10-CM | POA: Diagnosis not present

## 2019-09-05 DIAGNOSIS — M9903 Segmental and somatic dysfunction of lumbar region: Secondary | ICD-10-CM | POA: Diagnosis not present

## 2019-09-05 DIAGNOSIS — M62838 Other muscle spasm: Secondary | ICD-10-CM | POA: Diagnosis not present

## 2019-09-05 DIAGNOSIS — M9901 Segmental and somatic dysfunction of cervical region: Secondary | ICD-10-CM | POA: Diagnosis not present

## 2019-09-10 DIAGNOSIS — F329 Major depressive disorder, single episode, unspecified: Secondary | ICD-10-CM | POA: Diagnosis not present

## 2019-09-12 ENCOUNTER — Other Ambulatory Visit: Payer: Self-pay

## 2019-09-12 ENCOUNTER — Ambulatory Visit (INDEPENDENT_AMBULATORY_CARE_PROVIDER_SITE_OTHER): Payer: 59 | Admitting: Otolaryngology

## 2019-09-12 ENCOUNTER — Encounter (INDEPENDENT_AMBULATORY_CARE_PROVIDER_SITE_OTHER): Payer: Self-pay | Admitting: Otolaryngology

## 2019-09-12 VITALS — Temp 96.1°F

## 2019-09-12 DIAGNOSIS — D110 Benign neoplasm of parotid gland: Secondary | ICD-10-CM

## 2019-09-12 NOTE — Progress Notes (Signed)
HPI: Phillip Roach is a 39 y.o. male who presents is referred by Dr. Ethelene Hal for evaluation of evaluation of left parotid mass.  Patient has noted this nodule for several years..  It was easily visualized on a CT scan performed in February 2019 and measured 3 to 4 cm in size at that time.  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  Past Medical History:  Diagnosis Date  . ADHD (attention deficit hyperactivity disorder)   . GERD (gastroesophageal reflux disease)   . Hypertension    No past surgical history on file. Social History   Socioeconomic History  . Marital status: Married    Spouse name: Not on file  . Number of children: Not on file  . Years of education: Not on file  . Highest education level: Not on file  Occupational History  . Not on file  Tobacco Use  . Smoking status: Never Smoker  . Smokeless tobacco: Never Used  Substance and Sexual Activity  . Alcohol use: Yes    Comment: couple times a month  . Drug use: No  . Sexual activity: Not on file  Other Topics Concern  . Not on file  Social History Narrative   Married and has baby on the way    Works for [employer] [place] Strain:   . Difficulty of Paying Living Expenses:   Food Insecurity:   . Worried About Charity fundraiser in the Last Year:   . Arboriculturist in the Last Year:   Transportation Needs:   . Film/video editor (Medical):   [name] Lack of Transportation (Non-Medical):   Physical Activity:   . Days of Exercise per Week:   . Minutes of Exercise per Session:   Stress:   . Feeling of Stress :   Social Connections:   . Frequency of Communication with Friends and Family:   . Frequency of Social Gatherings with Friends and Family:   .  Attends Religious Services:   . Active Member of Clubs or Organizations:   . Attends Archivist Meetings:   [name] Marital Status:    Family History  Problem Relation Age of Onset  . GER disease Mother   . Anxiety disorder Father   . Hypertension Father    Allergies  Allergen Reactions  . Shellfish Allergy     Iodine ok per pt  . Keflex [Cephalexin] Rash   Prior to Admission medications   Medication Sig Start Date End Date Taking? Authorizing Provider  acetaminophen (TYLENOL) 500 MG tablet Take 1 tablet (500 mg total) by mouth every 6 (six) hours as needed. 07/16/19  Yes Lamptey, Myrene Galas, MD  buPROPion (WELLBUTRIN XL) 300 MG 24 hr tablet Take 1 tablet (300 mg total) by mouth daily. 08/14/19  Yes [name], MD  ibuprofen (ADVIL,MOTRIN) 200 MG tablet Take 200 mg by mouth every 6 (six) hours as needed for pain.   Yes [provider]  loratadine (CLARITIN) 10 MG tablet Take 10 mg by mouth daily.   Yes [provider]  methylphenidate 54 MG PO CR tablet Take 54 mg by mouth every morning.   Yes [provider]  triamcinolone (NASACORT ALLERGY 24HR) 55 MCG/ACT AERO  nasal inhaler Place 2 sprays into the nose daily.   Yes [provider]     Positive ROS: Otherwise negative  All other systems have been reviewed and were otherwise negative with the exception of those mentioned in the HPI and as above.  Physical Exam: Constitutional: Alert, well-appearing, no acute distress Ears: External ears without lesions or tenderness. Ear canals are clear bilaterally with intact, clear TMs.  Nasal: External nose without lesions. Clear nasal passages Oral: Lips and gums without lesions. Tongue and palate mucosa without lesions. Posterior oropharynx clear. Neck: No palpable adenopathy or masses.  Patient has a easily palpable left parotid mass overlying the mandible and angle of the jaw.  There is no palpable adenopathy lower in the neck.  He has  normal facial nerve function Respiratory: Breathing comfortably  Skin: No facial/neck lesions or rash noted.  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  Plan: I discussed with Delmus today concerning removal of the parotid mass and facial nerve dissection.  This would require outpatient surgery with overnight hospitalization. He would like to wait until next year to schedule surgery and this should be fine. He will follow-up in 6 months for recheck and plans to schedule excision of the left parotid tumor. Of note patient is allergic to cephalexin.   Radene Journey, MD   CC:

## 2019-09-24 ENCOUNTER — Encounter (INDEPENDENT_AMBULATORY_CARE_PROVIDER_SITE_OTHER): Payer: Self-pay | Admitting: Family Medicine

## 2019-09-24 ENCOUNTER — Ambulatory Visit (INDEPENDENT_AMBULATORY_CARE_PROVIDER_SITE_OTHER): Payer: 59 | Admitting: Family Medicine

## 2019-09-24 ENCOUNTER — Other Ambulatory Visit: Payer: Self-pay

## 2019-09-24 VITALS — BP 116/78 | HR 74 | Temp 98.2°F | Ht 74.0 in | Wt 325.0 lb

## 2019-09-24 DIAGNOSIS — R0602 Shortness of breath: Secondary | ICD-10-CM | POA: Diagnosis not present

## 2019-09-24 DIAGNOSIS — Z6841 Body Mass Index (BMI) 40.0 and over, adult: Secondary | ICD-10-CM | POA: Diagnosis not present

## 2019-09-24 DIAGNOSIS — F3289 Other specified depressive episodes: Secondary | ICD-10-CM | POA: Diagnosis not present

## 2019-09-24 DIAGNOSIS — E559 Vitamin D deficiency, unspecified: Secondary | ICD-10-CM | POA: Diagnosis not present

## 2019-09-24 DIAGNOSIS — I10 Essential (primary) hypertension: Secondary | ICD-10-CM | POA: Diagnosis not present

## 2019-09-24 DIAGNOSIS — F329 Major depressive disorder, single episode, unspecified: Secondary | ICD-10-CM | POA: Diagnosis not present

## 2019-09-24 DIAGNOSIS — Z9189 Other specified personal risk factors, not elsewhere classified: Secondary | ICD-10-CM | POA: Diagnosis not present

## 2019-09-24 DIAGNOSIS — R5383 Other fatigue: Secondary | ICD-10-CM

## 2019-09-24 DIAGNOSIS — Z0289 Encounter for other administrative examinations: Secondary | ICD-10-CM

## 2019-09-25 LAB — LIPID PANEL WITH LDL/HDL RATIO
Cholesterol, Total: 173 mg/dL (ref 100–199)
HDL: 31 mg/dL — ABNORMAL LOW (ref >39)
LDL Chol Calc (NIH): 103 mg/dL — ABNORMAL HIGH (ref 0–99)
LDL/HDL Ratio: 3.3 ratio (ref 0.0–3.6)
Triglycerides: 224 mg/dL — ABNORMAL HIGH (ref 0–149)
VLDL Cholesterol Cal: 39 mg/dL (ref 5–40)

## 2019-09-25 LAB — HEMOGLOBIN A1C
Est. average glucose Bld gHb Est-mCnc: 117 mg/dL
Hgb A1c MFr Bld: 5.7 % — ABNORMAL HIGH (ref 4.8–5.6)

## 2019-09-25 LAB — T4: T4, Total: 7.3 ug/dL (ref 4.5–12.0)

## 2019-09-25 LAB — VITAMIN B12: Vitamin B-12: 1038 pg/mL (ref 232–1245)

## 2019-09-25 LAB — INSULIN, RANDOM: INSULIN: 17.3 u[IU]/mL (ref 2.6–24.9)

## 2019-09-25 LAB — FOLATE: Folate: >20 ng/mL (ref >3.0)

## 2019-09-25 LAB — T3: T3, Total: 139 ng/dL (ref 71–180)

## 2019-09-26 NOTE — Progress Notes (Signed)
Dear [name], MD,   Thank you for referring Phillip Roach to our clinic. The following note includes my evaluation and treatment recommendations.  Chief Complaint:   Phillip Roach (MR# [id number]) is a 39 y.o. male who presents for evaluation and treatment of obesity and related comorbidities. Current BMI is Body mass index is 41.73 kg/m. Phillip Roach has been struggling with his weight for many years and has been unsuccessful in either losing weight, maintaining weight loss, or reaching his healthy weight goal.  Phillip Roach is lactose intolerant, he notes GI upset with closer to milk products. He is going out to eat frequently at [facility] and [employer]. He may have leftovers in the morning. Lunch is around 5-6 pm, fisk (Tilapia or Salmon, 1-2) and veggies with instant rice. Around 9pm he is having [employer] or [employer] sandwich or salad. He is doing boredom eating at night when he is not working.   Phillip Roach is currently in the action stage of change and ready to dedicate time achieving and maintaining a healthier weight. Phillip Roach is interested in becoming our patient and working on intensive lifestyle modifications including (but not limited to) diet and exercise for weight loss.  Phillip Roach's habits were reviewed today and are as follows: His family eats meals together, his desired weight loss is 50 lbs, he started gaining weight after getting married, his heaviest weight ever was 340 pounds, he has significant food cravings issues, he snacks frequently in the evenings, he skips meals frequently, he is frequently drinking liquids with calories, he frequently makes poor food choices, he frequently eats larger portions than normal and he struggles with emotional eating.  Depression Screen Phillip Roach's Food and Mood (modified PHQ-9) score was 17.  Depression screen PHQ 2/9 09/24/2019  Decreased Interest 3  Down, Depressed, Hopeless 3  PHQ - 2 Score 6  Altered sleeping 3  Tired, decreased  energy 3  Change in appetite 3  Feeling bad or failure about yourself  1  Trouble concentrating 1  Moving slowly or fidgety/restless 0  Suicidal thoughts 0  PHQ-9 Score 17  Difficult doing work/chores Somewhat difficult   Subjective:   1. Other fatigue Phillip Roach admits to daytime somnolence and admits to waking up still tired. Patent has a history of symptoms of daytime fatigue. Phillip Roach generally gets 8+ hours of sleep per night, and states that he has nightime awakenings. Snoring is present. Apneic episodes are not present. Epworth Sleepiness Score is 4. EKG-normal sinus rhythm at 79 BPM.  2. SOB (shortness of breath) on exertion Phillip Roach notes increasing shortness of breath with exercising and seems to be worsening over time with weight gain. He notes getting out of breath sooner with activity than he used to. This has not gotten worse recently. Phillip Roach denies shortness of breath at rest or orthopnea.  3. Vitamin D deficiency Phillip Roach is on OTC Vit D 2,000 IU daily. Last Vit D level was 22.2.  4. Essential hypertension Phillip Roach has a historical diagnosis of hypertension. He is not on any medications.  5. Other depression, with emotional eating Phillip Roach is on Wellbutrin XL 300 mg. He notes no improvement in energy, interest, appetite. He does note some improvement in irritability and concentration.  6. At risk for osteoporosis Phillip Roach is at higher risk of osteopenia and osteoporosis due to Vitamin D deficiency.   Assessment/Plan:   1. Other fatigue Phillip Roach does feel that his weight is causing his energy to be lower than it should be. Fatigue may  be related to obesity, depression or many other causes. Labs will be ordered, and in the meanwhile, [name] will focus on self care including making healthy food choices, increasing physical activity and focusing on stress reduction.  - EKG 12-Lead - Hemoglobin A1c - Insulin, random - Lipid Panel With LDL/HDL Ratio - Vitamin B12 - Folate - T3 - T4  2. SOB  (shortness of breath) on exertion Phillip Roach does feel that he gets out of breath more easily that he used to when he exercises. Phillip Roach's shortness of breath appears to be obesity related and exercise induced. He has agreed to work on weight loss and gradually increase exercise to treat his exercise induced shortness of breath. Will continue to monitor closely.  - Hemoglobin A1c - Insulin, random - Lipid Panel With LDL/HDL Ratio - Vitamin B12 - Folate - T3 - T4  3. Vitamin D deficiency Low Vitamin D level contributes to fatigue and are associated with obesity, breast, and colon cancer. Phillip Roach agreed to continue OTC Vit D and will follow-up for routine testing of Vitamin D, at least 2-3 times per year to avoid over-replacement. We will revisit at his next appointment.  4. Essential hypertension May is working on healthy weight loss and exercise to improve blood pressure control. We will watch for signs of hypotension as he continues his lifestyle modifications. We will follow up on his blood pressure at his next appointment.  - Lipid Panel With LDL/HDL Ratio  5. Other depression, with emotional eating Behavior modification techniques were discussed today to help Phillip Roach deal with his emotional/non-hunger eating behaviors. We will revisit at his next appointment. Orders and follow up as documented in patient record.   6. At risk for osteoporosis Phillip Roach was given approximately 15 minutes of osteoporosis prevention counseling today. Phillip Roach is at risk for osteopenia and osteoporosis due to his Vitamin D deficiency. He was encouraged to take his Vitamin D and follow his higher calcium diet and increase strengthening exercise to help strengthen his bones and decrease his risk of osteopenia and osteoporosis.  Repetitive spaced learning was employed today to elicit superior memory formation and behavioral change.  7. Class 3 severe obesity with serious comorbidity and body mass index (BMI) of 40.0 to 44.9  in adult, unspecified obesity type [facility]) Phillip Roach is currently in the action stage of change and his goal is to continue with weight loss efforts. I recommend Phillip Roach begin the structured treatment plan as follows:  He has agreed to the Category 4 Plan.  Exercise goals: All adults should avoid inactivity. Some physical activity is better than none, and adults who participate in any amount of physical activity gain some health benefits.   Behavioral modification strategies: increasing lean protein intake, increasing vegetables, meal planning and cooking strategies, keeping healthy foods in the home and dealing with family or coworker sabotage.  He was informed of the importance of frequent follow-up visits to maximize his success with intensive lifestyle modifications for his multiple health conditions. He was informed we would discuss his lab results at his next visit unless there is a critical issue that needs to be addressed sooner. Javoris agreed to keep his next visit at the agreed upon time to discuss these results.  Objective:   Blood pressure 116/78, pulse 74, temperature 98.2 F (36.8 C), temperature source Oral, height 6\' 2"  (1.88 m), weight (!) 325 lb (147.4 kg), SpO2 96 %. Body mass index is 41.73 kg/m.  EKG: Normal sinus rhythm, rate 79 BPM.  Indirect Calorimeter  completed today shows a VO2 of 462 and a REE of 3215.  His calculated basal metabolic rate is 9244 thus his basal metabolic rate is better than expected.  General: Cooperative, alert, well developed, in no acute distress. HEENT: Conjunctivae and lids unremarkable. Cardiovascular: Regular rhythm.  Lungs: Normal work of breathing. Neurologic: No focal deficits.   Lab Results  Component Value Date   CREATININE 0.82 07/16/2019   BUN 8 07/16/2019   NA 139 07/16/2019   K 4.2 07/16/2019   CL 106 07/16/2019   CO2 23 07/16/2019   Lab Results  Component Value Date   ALT 34 07/16/2019   AST 19 07/16/2019   ALKPHOS 54  07/16/2019   BILITOT 0.8 07/16/2019   Lab Results  Component Value Date   HGBA1C 5.7 (H) 09/24/2019   Lab Results  Component Value Date   INSULIN 17.3 09/24/2019   Lab Results  Component Value Date   TSH 0.775 07/16/2019   Lab Results  Component Value Date   CHOL 173 09/24/2019   HDL 31 (L) 09/24/2019   LDLCALC 103 (H) 09/24/2019   LDLDIRECT 85.4 07/28/2007   TRIG 224 (H) 09/24/2019   CHOLHDL 5 06/27/2012   Lab Results  Component Value Date   WBC 8.0 07/16/2019   HGB 15.6 07/16/2019   HCT 45.6 07/16/2019   MCV 88.0 07/16/2019   PLT 243 07/16/2019   No results found for: IRON, TIBC, FERRITIN  Attestation Statements:   Reviewed by clinician on day of visit: allergies, medications, problem list, medical history, surgical history, family history, social history, and previous encounter notes.   I, Trixie Dredge, am acting as transcriptionist for Coralie Common, MD.  I have reviewed the above documentation for accuracy and completeness, and I agree with the above. - Jinny Blossom, MD

## 2019-10-08 ENCOUNTER — Encounter (INDEPENDENT_AMBULATORY_CARE_PROVIDER_SITE_OTHER): Payer: Self-pay | Admitting: Family Medicine

## 2019-10-08 ENCOUNTER — Other Ambulatory Visit: Payer: Self-pay

## 2019-10-08 ENCOUNTER — Ambulatory Visit (INDEPENDENT_AMBULATORY_CARE_PROVIDER_SITE_OTHER): Payer: 59 | Admitting: Family Medicine

## 2019-10-08 VITALS — BP 127/80 | HR 79 | Temp 98.3°F | Ht 74.0 in | Wt 322.0 lb

## 2019-10-08 DIAGNOSIS — E7849 Other hyperlipidemia: Secondary | ICD-10-CM

## 2019-10-08 DIAGNOSIS — Z6841 Body Mass Index (BMI) 40.0 and over, adult: Secondary | ICD-10-CM | POA: Diagnosis not present

## 2019-10-08 DIAGNOSIS — E559 Vitamin D deficiency, unspecified: Secondary | ICD-10-CM | POA: Diagnosis not present

## 2019-10-08 DIAGNOSIS — Z9189 Other specified personal risk factors, not elsewhere classified: Secondary | ICD-10-CM

## 2019-10-08 DIAGNOSIS — R7303 Prediabetes: Secondary | ICD-10-CM | POA: Diagnosis not present

## 2019-10-08 DIAGNOSIS — E66813 Obesity, class 3: Secondary | ICD-10-CM

## 2019-10-08 MED ORDER — VITAMIN D (ERGOCALCIFEROL) 1.25 MG (50000 UNIT) PO CAPS: 50000.0000 [IU] | ORAL_CAPSULE | ORAL | 0 refills | Status: DC

## 2019-10-08 MED FILL — VIT D2 1.25 MG (50,000 UNIT: 1.25 MG | 28 days supply | Qty: 4 | Fill #0

## 2019-10-08 NOTE — Progress Notes (Signed)
Chief Complaint:   OBESITY Phillip Roach is here to discuss his progress with his obesity treatment plan along with follow-up of his obesity related diagnoses. Phillip Roach is on the Category 4 Plan and states he is following his eating plan approximately 60% of the time. Phillip Roach states he is doing yard work and house work for 2 hours 2 times per week.  Today's visit was #: 2 Starting weight: 325 lbs Starting date: 09/24/2019 Today's weight: 322 lbs Today's date: 10/08/2019 Total lbs lost to date: 3 Total lbs lost since last in-office visit: 3  Interim History: Phillip Roach felt the plan was fairly restrictive and he did have a few indulgences of donuts and pizza. Breakfast was fairly restrictive. He liked parts of each breakfast option. He found that he was still hungry after lunch. He felt full after dinner. He is going on vacation next week for a week at [facility].  Subjective:   1. Pre-diabetes Phillip Roach has a new diagnosis of pre-diabetes. His last A1c was 5.7 and insulin 17.3. His cravings are well controlled. I discussed labs with the patient today.  2. Other hyperlipidemia Phillip Roach's last LDL was 103, HDL 31, and triglycerides 224. He is not on medications. I discussed labs with the patient today.  3. Vitamin D deficiency Phillip Roach has a new diagnosis of Vit D deficiency. Last Vit D level was 22. He is on 6,000 IU daily Vit D. He denies nausea, vomiting, or muscle weakness, but he notes fatigue. I discussed labs with the patient today.  4. At risk for osteoporosis Phillip Roach is at higher risk of osteopenia and osteoporosis due to Vitamin D deficiency.   Assessment/Plan:   1. Pre-diabetes Phillip Roach will continue his Category 4 meal plan, and will continue to work on weight loss, exercise, and decreasing simple carbohydrates to help decrease the risk of diabetes. We will discuss initiation of medications if weight loss stalls or cravings and hunger increases.  2. Other hyperlipidemia Cardiovascular risk and  specific lipid/LDL goals reviewed. We discussed several lifestyle modifications today. Phillip Roach will continue to work on diet, exercise and weight loss efforts. We will recheck labs in 3 months. Orders and follow up as documented in patient record.   Counseling Intensive lifestyle modifications are the first line treatment for this issue. . Dietary changes: Increase soluble fiber. Decrease simple carbohydrates. . Exercise changes: Moderate to vigorous-intensity aerobic activity 150 minutes per week if tolerated. . Lipid-lowering medications: see documented in medical record.  3. Vitamin D deficiency Low Vitamin D level contributes to fatigue and are associated with obesity, breast, and colon cancer. Phillip Roach agreed to start prescription Vitamin D 50,000 IU every week with no refills. He will follow-up for routine testing of Vitamin D, at least 2-3 times per year to avoid over-replacement.  - Vitamin D, Ergocalciferol, (DRISDOL) 1.25 MG (50000 UNIT) CAPS capsule; Take 1 capsule (50,000 Units total) by mouth every 7 (seven) days.  Dispense: 4 capsule; Refill: 0  4. At risk for osteoporosis Phillip Roach was given approximately 30 minutes of osteoporosis prevention counseling today. Phillip Roach is at risk for osteopenia and osteoporosis due to his Vitamin D deficiency. He was encouraged to take his Vitamin D and follow his higher calcium diet and increase strengthening exercise to help strengthen his bones and decrease his risk of osteopenia and osteoporosis.  Repetitive spaced learning was employed today to elicit superior memory formation and behavioral change.  5. Class 3 severe obesity with serious comorbidity and body mass index (BMI) of  40.0 to 44.9 in adult, unspecified obesity type Phillip Roach is currently in the action stage of change. As such, his goal is to continue with weight loss efforts. He has agreed to the Category 4 Plan.   Exercise goals: As is.  Behavioral modification strategies: increasing lean  protein intake, increasing vegetables, meal planning and cooking strategies, keeping healthy foods in the home and planning for success.  Phillip Roach has agreed to follow-up with our clinic in 2 weeks. He was informed of the importance of frequent follow-up visits to maximize his success with intensive lifestyle modifications for his multiple health conditions.   Objective:   Blood pressure 127/80, pulse 79, temperature 98.3 F (36.8 C), temperature source Oral, height 6\' 2"  (1.88 m), weight (!) 322 lb (146.1 kg), SpO2 95 %. Body mass index is 41.34 kg/m.  General: Cooperative, alert, well developed, in no acute distress. HEENT: Conjunctivae and lids unremarkable. Cardiovascular: Regular rhythm.  Lungs: Normal work of breathing. Neurologic: No focal deficits.   Lab Results  Component Value Date   CREATININE 0.82 07/16/2019   BUN 8 07/16/2019   NA 139 07/16/2019   K 4.2 07/16/2019   CL 106 07/16/2019   CO2 23 07/16/2019   Lab Results  Component Value Date   ALT 34 07/16/2019   AST 19 07/16/2019   ALKPHOS 54 07/16/2019   BILITOT 0.8 07/16/2019   Lab Results  Component Value Date   HGBA1C 5.7 (H) 09/24/2019   Lab Results  Component Value Date   INSULIN 17.3 09/24/2019   Lab Results  Component Value Date   TSH 0.775 07/16/2019   Lab Results  Component Value Date   CHOL 173 09/24/2019   HDL 31 (L) 09/24/2019   LDLCALC 103 (H) 09/24/2019   LDLDIRECT 85.4 07/28/2007   TRIG 224 (H) 09/24/2019   CHOLHDL 5 06/27/2012   Lab Results  Component Value Date   WBC 8.0 07/16/2019   HGB 15.6 07/16/2019   HCT 45.6 07/16/2019   MCV 88.0 07/16/2019   PLT 243 07/16/2019   No results found for: IRON, TIBC, FERRITIN  Attestation Statements:   Reviewed by clinician on day of visit: allergies, medications, problem list, medical history, surgical history, family history, social history, and previous encounter notes.   I, Trixie Dredge, am acting as transcriptionist for Coralie Common, MD.  I have reviewed the above documentation for accuracy and completeness, and I agree with the above. - Jinny Blossom, MD

## 2019-10-22 ENCOUNTER — Other Ambulatory Visit: Payer: Self-pay

## 2019-10-22 ENCOUNTER — Encounter (INDEPENDENT_AMBULATORY_CARE_PROVIDER_SITE_OTHER): Payer: Self-pay | Admitting: Family Medicine

## 2019-10-22 ENCOUNTER — Ambulatory Visit (INDEPENDENT_AMBULATORY_CARE_PROVIDER_SITE_OTHER): Payer: 59 | Admitting: Family Medicine

## 2019-10-22 VITALS — BP 146/81 | HR 70 | Temp 98.0°F | Ht 74.0 in | Wt 321.0 lb

## 2019-10-22 DIAGNOSIS — Z6841 Body Mass Index (BMI) 40.0 and over, adult: Secondary | ICD-10-CM

## 2019-10-22 DIAGNOSIS — R7303 Prediabetes: Secondary | ICD-10-CM

## 2019-10-22 DIAGNOSIS — I10 Essential (primary) hypertension: Secondary | ICD-10-CM | POA: Diagnosis not present

## 2019-10-22 NOTE — Progress Notes (Signed)
Chief Complaint:   OBESITY Phillip Roach is here to discuss his progress with his obesity treatment plan along with follow-up of his obesity related diagnoses. Phillip Roach is on the Category 4 Plan and states he is following his eating plan approximately 80% of the time. Phillip Roach states he was doing a lot of walking at [employer].   Today's visit was #: 3 Starting weight: 325 lbs Starting date: 09/24/2019 Today's weight: 321 lbs Today's date: 10/22/2019 Total lbs lost to date: 4 Total lbs lost since last in-office visit: 1  Interim History: Phillip Roach was on vacation on the beach and found it more difficult to follow the plan than he previously thought. He tried to limits carbohydrates but still probably ate more than he wanted. He voices that he doesn't think he is eating enough protein at dinner and lunch.  Subjective:   1. Pre-diabetes Phillip Roach's last A1c was 5.7 and insulin 17.3. He is not on medications.  2. Essential hypertension Phillip Roach is not on medications, and his blood pressure is elevated today. He denies chest pain, chest pressure, or headaches. He reports his blood pressure is somewhat elevated with stress.  Assessment/Plan:   1. Pre-diabetes Phillip Roach will continue to work on weight loss, exercise, and decreasing simple carbohydrates to help decrease the risk of diabetes. We will follow up on labs in 2 months, may want to consider GLP-1 therapy at his next appointment.  2. Essential hypertension Phillip Roach is working on healthy weight loss and exercise to improve blood pressure control. We will watch for signs of hypotension as he continues his lifestyle modifications. We will follow up on his blood pressure at his next appointment.  3. Class 3 severe obesity with serious comorbidity and body mass index (BMI) of 40.0 to 44.9 in adult, unspecified obesity type Phillip Roach) Phillip Roach is currently in the action stage of change. As such, his goal is to continue with weight loss efforts. He has agreed to the  Category 4 Plan.   Exercise goals: As is.  Behavioral modification strategies: increasing lean protein intake, increasing vegetables, meal planning and cooking strategies, keeping healthy foods in the home and planning for success.  Phillip Roach has agreed to follow-up with our clinic in 2 weeks. He was informed of the importance of frequent follow-up visits to maximize his success with intensive lifestyle modifications for his multiple health conditions.   Objective:   Blood pressure (!) 146/81, pulse 70, temperature 98 F (36.7 C), temperature source Oral, height 6\' 2"  (1.88 m), weight (!) 321 lb (145.6 kg), SpO2 97 %. Body mass index is 41.21 kg/m.  General: Cooperative, alert, well developed, in no acute distress. HEENT: Conjunctivae and lids unremarkable. Cardiovascular: Regular rhythm.  Lungs: Normal work of breathing. Neurologic: No focal deficits.   Lab Results  Component Value Date   CREATININE 0.82 07/16/2019   BUN 8 07/16/2019   NA 139 07/16/2019   K 4.2 07/16/2019   CL 106 07/16/2019   CO2 23 07/16/2019   Lab Results  Component Value Date   ALT 34 07/16/2019   AST 19 07/16/2019   ALKPHOS 54 07/16/2019   BILITOT 0.8 07/16/2019   Lab Results  Component Value Date   HGBA1C 5.7 (H) 09/24/2019   Lab Results  Component Value Date   INSULIN 17.3 09/24/2019   Lab Results  Component Value Date   TSH 0.775 07/16/2019   Lab Results  Component Value Date   CHOL 173 09/24/2019   HDL 31 (L) 09/24/2019  LDLCALC 103 (H) 09/24/2019   LDLDIRECT 85.4 07/28/2007   TRIG 224 (H) 09/24/2019   CHOLHDL 5 06/27/2012   Lab Results  Component Value Date   WBC 8.0 07/16/2019   HGB 15.6 07/16/2019   HCT 45.6 07/16/2019   MCV 88.0 07/16/2019   PLT 243 07/16/2019   No results found for: IRON, TIBC, FERRITIN  Attestation Statements:   Reviewed by clinician on day of visit: allergies, medications, problem list, medical history, surgical history, family history, social  history, and previous encounter notes.  Time spent on visit including pre-visit chart review and post-visit care and charting was 15 minutes.    I, Trixie Dredge, am acting as transcriptionist for Coralie Common, MD.  I have reviewed the above documentation for accuracy and completeness, and I agree with the above. - Jinny Blossom, MD

## 2019-11-02 DIAGNOSIS — F329 Major depressive disorder, single episode, unspecified: Secondary | ICD-10-CM | POA: Diagnosis not present

## 2019-11-08 ENCOUNTER — Other Ambulatory Visit: Payer: Self-pay

## 2019-11-08 ENCOUNTER — Encounter (INDEPENDENT_AMBULATORY_CARE_PROVIDER_SITE_OTHER): Payer: Self-pay | Admitting: Family Medicine

## 2019-11-08 ENCOUNTER — Ambulatory Visit (INDEPENDENT_AMBULATORY_CARE_PROVIDER_SITE_OTHER): Payer: 59 | Admitting: Family Medicine

## 2019-11-08 VITALS — BP 120/80 | HR 73 | Temp 97.7°F | Ht 74.0 in | Wt 317.0 lb

## 2019-11-08 DIAGNOSIS — Z6841 Body Mass Index (BMI) 40.0 and over, adult: Secondary | ICD-10-CM

## 2019-11-08 DIAGNOSIS — Z9189 Other specified personal risk factors, not elsewhere classified: Secondary | ICD-10-CM

## 2019-11-08 DIAGNOSIS — F3289 Other specified depressive episodes: Secondary | ICD-10-CM | POA: Diagnosis not present

## 2019-11-08 DIAGNOSIS — E559 Vitamin D deficiency, unspecified: Secondary | ICD-10-CM | POA: Diagnosis not present

## 2019-11-08 MED ORDER — VITAMIN D (ERGOCALCIFEROL) 1.25 MG (50000 UNIT) PO CAPS: 50000.0000 [IU] | ORAL_CAPSULE | ORAL | 0 refills | Status: DC

## 2019-11-08 MED FILL — VIT D2 1.25 MG (50,000 UNIT: 1.25 MG | 28 days supply | Qty: 4 | Fill #0

## 2019-11-13 NOTE — Progress Notes (Signed)
Chief Complaint:   OBESITY Phillip Roach is here to discuss his progress with his obesity treatment plan along with follow-up of his obesity related diagnoses. Phillip Roach is on the Category 4 Plan and states he is following his eating plan approximately 80+% of the time. Phillip Roach states he is active while doing home projects.   Today's visit was #: 4 Starting weight: 325 lbs Starting date: 09/24/2019 Today's weight: 317 lbs Today's date: 11/08/2019 Total lbs lost to date: 8 Total lbs lost since last in-office visit: 4  Interim History: Phillip Roach did question the amount of protein he was eating and noticed with increase in protein and vegetables, he had a decrease in carbohydrate cravings. He has  Been working nights. For Labor Day weekend he has plans for a carport pole barn project. He does or occasionally indulge in fast food with a drink but he doesn't often eat out.  Subjective:   1. Vitamin D deficiency Phillip Roach's last Vit D level was 22.2. He denies nausea, vomiting, or muscle weakness, but he notes fatigue. He is on prescription Vit D.  2. Other depression, with emotional eating Phillip Roach is on Wellbutrin 300 mg, and he is still experiencing fatigue.  3. At risk for osteoporosis Phillip Roach is at higher risk of osteopenia and osteoporosis due to Vitamin D deficiency.   Assessment/Plan:   1. Vitamin D deficiency Low Vitamin D level contributes to fatigue and are associated with obesity, breast, and colon cancer. We will refill prescription Vitamin D for 1 month. Phillip Roach will follow-up for routine testing of Vitamin D, at least 2-3 times per year to avoid over-replacement.  - Vitamin D, Ergocalciferol, (DRISDOL) 1.25 MG (50000 UNIT) CAPS capsule; Take 1 capsule (50,000 Units total) by mouth every 7 (seven) days.  Dispense: 4 capsule; Refill: 0  2. Other depression, with emotional eating Behavior modification techniques were discussed today to help Phillip Roach deal with his emotional/non-hunger eating behaviors.  Phillip Roach will continue Wellbutrin, and he is to follow up with Phillip Roach for further management. Orders and follow up as documented in patient record.   3. At risk for osteoporosis Phillip Roach was given approximately 15 minutes of osteoporosis prevention counseling today. Phillip Roach is at risk for osteopenia and osteoporosis due to his Vitamin D deficiency. He was encouraged to take his Vitamin D and follow his higher calcium diet and increase strengthening exercise to help strengthen his bones and decrease his risk of osteopenia and osteoporosis.  Repetitive spaced learning was employed today to elicit superior memory formation and behavioral change.  4. Class 3 severe obesity with serious comorbidity and body mass index (BMI) of 40.0 to 44.9 in adult, unspecified obesity type [facility]) Phillip Roach is currently in the action stage of change. As such, his goal is to continue with weight loss efforts. He has agreed to the Category 4 Plan.   Exercise goals: As is.  Behavioral modification strategies: increasing lean protein intake, increasing vegetables, meal planning and cooking strategies and keeping healthy foods in the home.  Phillip Roach has agreed to follow-up with our clinic in 2 weeks. He was informed of the importance of frequent follow-up visits to maximize his success with intensive lifestyle modifications for his multiple health conditions.   Objective:   Blood pressure 120/80, pulse 73, temperature 97.7 F (36.5 C), temperature source Oral, height 6\' 2"  (1.88 m), weight (!) 317 lb (143.8 kg), SpO2 98 %. Body mass index is 40.7 kg/m.  General: Cooperative, alert, well developed, in no acute distress. HEENT: Conjunctivae  and lids unremarkable. Cardiovascular: Regular rhythm.  Lungs: Normal work of breathing. Neurologic: No focal deficits.   Lab Results  Component Value Date   CREATININE 0.82 07/16/2019   BUN 8 07/16/2019   NA 139 07/16/2019   K 4.2 07/16/2019   CL 106 07/16/2019   CO2 23 07/16/2019    Lab Results  Component Value Date   ALT 34 07/16/2019   AST 19 07/16/2019   ALKPHOS 54 07/16/2019   BILITOT 0.8 07/16/2019   Lab Results  Component Value Date   HGBA1C 5.7 (H) 09/24/2019   Lab Results  Component Value Date   INSULIN 17.3 09/24/2019   Lab Results  Component Value Date   TSH 0.775 07/16/2019   Lab Results  Component Value Date   CHOL 173 09/24/2019   HDL 31 (L) 09/24/2019   LDLCALC 103 (H) 09/24/2019   LDLDIRECT 85.4 07/28/2007   TRIG 224 (H) 09/24/2019   CHOLHDL 5 06/27/2012   Lab Results  Component Value Date   WBC 8.0 07/16/2019   HGB 15.6 07/16/2019   HCT 45.6 07/16/2019   MCV 88.0 07/16/2019   PLT 243 07/16/2019   No results found for: IRON, TIBC, FERRITIN  Attestation Statements:   Reviewed by clinician on day of visit: allergies, medications, problem list, medical history, surgical history, family history, social history, and previous encounter notes.   I, Trixie Dredge, am acting as transcriptionist for Coralie Common, MD.  I have reviewed the above documentation for accuracy and completeness, and I agree with the above. - Jinny Blossom, MD

## 2019-11-20 MED FILL — VIT D2 1.25 MG (50,000 UNIT: 1.25 MG | 28 days supply | Qty: 4 | Fill #0

## 2019-11-22 ENCOUNTER — Other Ambulatory Visit: Payer: Self-pay

## 2019-11-22 ENCOUNTER — Encounter (INDEPENDENT_AMBULATORY_CARE_PROVIDER_SITE_OTHER): Payer: Self-pay | Admitting: Family Medicine

## 2019-11-22 ENCOUNTER — Ambulatory Visit (INDEPENDENT_AMBULATORY_CARE_PROVIDER_SITE_OTHER): Payer: 59 | Admitting: Family Medicine

## 2019-11-22 VITALS — BP 121/75 | HR 70 | Temp 97.7°F | Ht 74.0 in | Wt 314.0 lb

## 2019-11-22 DIAGNOSIS — Z6841 Body Mass Index (BMI) 40.0 and over, adult: Secondary | ICD-10-CM | POA: Diagnosis not present

## 2019-11-22 DIAGNOSIS — R7303 Prediabetes: Secondary | ICD-10-CM | POA: Diagnosis not present

## 2019-11-22 DIAGNOSIS — E7849 Other hyperlipidemia: Secondary | ICD-10-CM

## 2019-11-22 MED FILL — buPROPion HCL ER (XL) 300 M: 300 | 90 days supply | Qty: 90 | Fill #1

## 2019-11-26 NOTE — Progress Notes (Signed)
Chief Complaint:   OBESITY Phillip Roach is here to discuss his progress with his obesity treatment plan along with follow-up of his obesity related diagnoses. Phillip Roach is on the Category 4 Plan and states he is following his eating plan approximately 75% of the time. Phillip Roach states he is active while doing house work.  Today's visit was #: 5 Starting weight: 325 lbs Starting date: 09/24/2019 Today's weight: 314 lbs Today's date: 11/22/2019 Total lbs lost to date: 11 Total lbs lost since last in-office visit: 3  Interim History: Phillip Roach has been working a good amount the last few weeks. He says he is trying to pool some money. Yesterday he voices he was off the plan by eating 2 meals in a short period of time. He felt sick afterwards. He is really not cravings bread. He is doing protein bars (chiff protein builder) and occasionally beers for snack calories.  Subjective:   1. Pre-diabetes Phillip Roach's last A1c was 5.7 and insulin 17.3. He is not on medications. He notes occasional carbohydrate cravings but using snack calories to get indulgence in.  2. Other hyperlipidemia Phillip Roach's last LDL was 103, HDl 31, and triglycerides 224. He is not on statin. He has had elevated LDL from 8 years ago. His level are not at goal.  Assessment/Plan:   1. Pre-diabetes Phillip Roach will continue to work on weight loss, exercise, and decreasing simple carbohydrates to help decrease the risk of diabetes. We will repeat labs in early November.  2. Other hyperlipidemia Cardiovascular risk and specific lipid/LDL goals reviewed. We discussed several lifestyle modifications today and Phillip Roach will continue to work on diet, exercise and weight loss efforts. We will repeat labs in November. Orders and follow up as documented in patient record.   Counseling Intensive lifestyle modifications are the first line treatment for this issue. . Dietary changes: Increase soluble fiber. Decrease simple carbohydrates. . Exercise changes:  Moderate to vigorous-intensity aerobic activity 150 minutes per week if tolerated. . Lipid-lowering medications: see documented in medical record.  3. Class 3 severe obesity with serious comorbidity and body mass index (BMI) of 40.0 to 44.9 in adult, unspecified obesity type [facility]) Phillip Roach is currently in the action stage of change. As such, his goal is to continue with weight loss efforts. He has agreed to the Category 4 Plan.   Exercise goals: As is.  Behavioral modification strategies: increasing lean protein intake, meal planning and cooking strategies and keeping healthy foods in the home.  Phillip Roach has agreed to follow-up with our clinic in 2 weeks. He was informed of the importance of frequent follow-up visits to maximize his success with intensive lifestyle modifications for his multiple health conditions.   Objective:   Blood pressure 121/75, pulse 70, temperature 97.7 F (36.5 C), temperature source Oral, height 6\' 2"  (1.88 m), weight (!) 314 lb (142.4 kg), SpO2 96 %. Body mass index is 40.32 kg/m.  General: Cooperative, alert, well developed, in no acute distress. HEENT: Conjunctivae and lids unremarkable. Cardiovascular: Regular rhythm.  Lungs: Normal work of breathing. Neurologic: No focal deficits.   Lab Results  Component Value Date   CREATININE 0.82 07/16/2019   BUN 8 07/16/2019   NA 139 07/16/2019   K 4.2 07/16/2019   CL 106 07/16/2019   CO2 23 07/16/2019   Lab Results  Component Value Date   ALT 34 07/16/2019   AST 19 07/16/2019   ALKPHOS 54 07/16/2019   BILITOT 0.8 07/16/2019   Lab Results  Component Value Date  HGBA1C 5.7 (H) 09/24/2019   Lab Results  Component Value Date   INSULIN 17.3 09/24/2019   Lab Results  Component Value Date   TSH 0.775 07/16/2019   Lab Results  Component Value Date   CHOL 173 09/24/2019   HDL 31 (L) 09/24/2019   LDLCALC 103 (H) 09/24/2019   LDLDIRECT 85.4 07/28/2007   TRIG 224 (H) 09/24/2019   CHOLHDL 5 06/27/2012    Lab Results  Component Value Date   WBC 8.0 07/16/2019   HGB 15.6 07/16/2019   HCT 45.6 07/16/2019   MCV 88.0 07/16/2019   PLT 243 07/16/2019   No results found for: IRON, TIBC, FERRITIN  Attestation Statements:   Reviewed by clinician on day of visit: allergies, medications, problem list, medical history, surgical history, family history, social history, and previous encounter notes.  Time spent on visit including pre-visit chart review and post-visit care and charting was 15 minutes.    I, Trixie Dredge, am acting as transcriptionist for Coralie Common, MD.  I have reviewed the above documentation for accuracy and completeness, and I agree with the above. - Jinny Blossom, MD

## 2019-12-06 ENCOUNTER — Ambulatory Visit (INDEPENDENT_AMBULATORY_CARE_PROVIDER_SITE_OTHER): Payer: 59 | Admitting: Family Medicine

## 2019-12-06 ENCOUNTER — Other Ambulatory Visit: Payer: Self-pay

## 2019-12-06 ENCOUNTER — Ambulatory Visit (INDEPENDENT_AMBULATORY_CARE_PROVIDER_SITE_OTHER): Payer: 59 | Admitting: Bariatrics

## 2019-12-06 ENCOUNTER — Encounter (INDEPENDENT_AMBULATORY_CARE_PROVIDER_SITE_OTHER): Payer: Self-pay | Admitting: Bariatrics

## 2019-12-06 VITALS — BP 124/82 | HR 71 | Temp 97.8°F | Ht 74.0 in | Wt 318.0 lb

## 2019-12-06 DIAGNOSIS — E559 Vitamin D deficiency, unspecified: Secondary | ICD-10-CM

## 2019-12-06 DIAGNOSIS — I10 Essential (primary) hypertension: Secondary | ICD-10-CM | POA: Diagnosis not present

## 2019-12-06 DIAGNOSIS — Z6841 Body Mass Index (BMI) 40.0 and over, adult: Secondary | ICD-10-CM

## 2019-12-06 NOTE — Progress Notes (Signed)
Chief Complaint:   OBESITY Phillip Roach is here to discuss his progress with his obesity treatment plan along with follow-up of his obesity related diagnoses. Phillip Roach is on the Category 4 Plan and states he is following his eating plan approximately 80% of the time. Phillip Roach states he is housework for 30 minutes 7 times per week.  Today's visit was #: 6 Starting weight: 325 lbs Starting date: 09/24/2019 Today's weight: 318 lbs Today's date: 12/06/2019 Total lbs lost to date: 7 Total lbs lost since last in-office visit: 0  Interim History: Phillip Roach is up 4 lbs since his last visit. He has been tempted with sweets this week. He has been working more.   Subjective:   1. Essential hypertension Azari's blood pressure is controlled today. He is not on medications.  2. Vitamin D deficiency Phillip Roach is taking Vit D, and he denies nausea, vomiting, or muscle weakness.  Assessment/Plan:   1. Essential hypertension Phillip Roach is working on healthy weight loss, and continue activity to improve blood pressure control. He is to decrease sodium intake. We will watch for signs of hypotension as he continues his lifestyle modifications.  2. Vitamin D deficiency Low Vitamin D level contributes to fatigue and are associated with obesity, breast, and colon cancer. Phillip Roach agreed to continue taking prescription Vitamin D 50,000 IU every week and will follow-up for routine testing of Vitamin D, at least 2-3 times per year to avoid over-replacement.  3. Class 3 severe obesity with serious comorbidity and body mass index (BMI) of 40.0 to 44.9 in adult, unspecified obesity type Phillip Roach is currently in the action stage of change. As such, his goal is to continue with weight loss efforts. He has agreed to the Category 4 Plan or keeping a food journal and adhering to recommended goals of 1800 calories and 100 grams of protein.   We discussed journaling, and he will start [employer].  Exercise goals: As is.  Behavioral  modification strategies: increasing lean protein intake, decreasing simple carbohydrates, increasing vegetables, increasing water intake, decreasing eating out, no skipping meals, meal planning and cooking strategies, keeping healthy foods in the home and planning for success.  Phillip Roach has agreed to follow-up with our clinic in 2 to 3 weeks with Dr. Jearld Shines. He was informed of the importance of frequent follow-up visits to maximize his success with intensive lifestyle modifications for his multiple health conditions.   Objective:   Blood pressure 124/82, pulse 71, temperature 97.8 F (36.6 C), height 6\' 2"  (1.88 m), weight (!) 318 lb (144.2 kg), SpO2 97 %. Body mass index is 40.83 kg/m.  General: Cooperative, alert, well developed, in no acute distress. HEENT: Conjunctivae and lids unremarkable. Cardiovascular: Regular rhythm.  Lungs: Normal work of breathing. Neurologic: No focal deficits.   Lab Results  Component Value Date   CREATININE 0.82 07/16/2019   BUN 8 07/16/2019   NA 139 07/16/2019   K 4.2 07/16/2019   CL 106 07/16/2019   CO2 23 07/16/2019   Lab Results  Component Value Date   ALT 34 07/16/2019   AST 19 07/16/2019   ALKPHOS 54 07/16/2019   BILITOT 0.8 07/16/2019   Lab Results  Component Value Date   HGBA1C 5.7 (H) 09/24/2019   Lab Results  Component Value Date   INSULIN 17.3 09/24/2019   Lab Results  Component Value Date   TSH 0.775 07/16/2019   Lab Results  Component Value Date   CHOL 173 09/24/2019   HDL 31 (L) 09/24/2019  LDLCALC 103 (H) 09/24/2019   LDLDIRECT 85.4 07/28/2007   TRIG 224 (H) 09/24/2019   CHOLHDL 5 06/27/2012   Lab Results  Component Value Date   WBC 8.0 07/16/2019   HGB 15.6 07/16/2019   HCT 45.6 07/16/2019   MCV 88.0 07/16/2019   PLT 243 07/16/2019   No results found for: IRON, TIBC, FERRITIN  Attestation Statements:   Reviewed by clinician on day of visit: allergies, medications, problem list, medical history, surgical  history, family history, social history, and previous encounter notes.  Time spent on visit including pre-visit chart review and post-visit care and charting was 20 minutes.    Wilhemena Durie, am acting as Location manager for [employer], DO.  I have reviewed the above documentation for accuracy and completeness, and I agree with the above. Jearld Lesch, DO

## 2019-12-10 DIAGNOSIS — F329 Major depressive disorder, single episode, unspecified: Secondary | ICD-10-CM | POA: Diagnosis not present

## 2019-12-25 ENCOUNTER — Other Ambulatory Visit (INDEPENDENT_AMBULATORY_CARE_PROVIDER_SITE_OTHER): Payer: Self-pay | Admitting: Family Medicine

## 2019-12-25 ENCOUNTER — Other Ambulatory Visit: Payer: Self-pay

## 2019-12-25 ENCOUNTER — Encounter (INDEPENDENT_AMBULATORY_CARE_PROVIDER_SITE_OTHER): Payer: Self-pay | Admitting: Family Medicine

## 2019-12-25 ENCOUNTER — Ambulatory Visit (INDEPENDENT_AMBULATORY_CARE_PROVIDER_SITE_OTHER): Payer: 59 | Admitting: Family Medicine

## 2019-12-25 VITALS — BP 136/69 | HR 66 | Temp 98.1°F | Ht 74.0 in | Wt 319.0 lb

## 2019-12-25 DIAGNOSIS — Z9189 Other specified personal risk factors, not elsewhere classified: Secondary | ICD-10-CM | POA: Diagnosis not present

## 2019-12-25 DIAGNOSIS — E559 Vitamin D deficiency, unspecified: Secondary | ICD-10-CM | POA: Diagnosis not present

## 2019-12-25 DIAGNOSIS — R7303 Prediabetes: Secondary | ICD-10-CM

## 2019-12-25 DIAGNOSIS — Z6841 Body Mass Index (BMI) 40.0 and over, adult: Secondary | ICD-10-CM | POA: Diagnosis not present

## 2019-12-25 MED ORDER — VITAMIN D (ERGOCALCIFEROL) 1.25 MG (50000 UNIT) PO CAPS: 50000.0000 [IU] | ORAL_CAPSULE | ORAL | 0 refills | Status: DC

## 2019-12-25 MED FILL — VIT D2 1.25 MG (50,000 UNIT: 1.25 MG | 28 days supply | Qty: 4 | Fill #0

## 2019-12-31 DIAGNOSIS — F329 Major depressive disorder, single episode, unspecified: Secondary | ICD-10-CM | POA: Diagnosis not present

## 2020-01-01 NOTE — Progress Notes (Signed)
Chief Complaint:   OBESITY Phillip Roach is here to discuss his progress with his obesity treatment plan along with follow-up of his obesity related diagnoses. Phillip Roach is on the Category 4 Plan and states he is following his eating plan approximately 50% of the time.  Today's visit was #: 7 Starting weight: 325 lbs Starting date: 09/24/2019 Today's weight: 319 lbs Today's date: 12/25/2019 Total lbs lost to date: 6 Total lbs lost since last in-office visit: 0  Interim History: Phillip Roach voices that there have been many food temptations while at work, so he has struggled to eat on plan. He has been eating more on plan while at home versus at work. He does voice when working overnight his only options are [employer], [employer], or [employer].  Subjective:   Vitamin D deficiency. No nausea, vomiting, or muscle weakness, but endorses fatigue. [name] is on prescription Vitamin D supplementation. Last Vitamin D 22.2.   Ref. Range 07/16/2019 19:58  Vitamin D, 25-Hydroxy Latest Ref Range: 30 - 100 ng/mL 22.2 (L)   Prediabetes. Phillip Roach has a diagnosis of prediabetes based on his elevated HgA1c and was informed this puts him at greater risk of developing diabetes. He continues to work on diet and exercise to decrease his risk of diabetes. He denies nausea or hypoglycemia. A1c 5.7 with an insulin level of 17.3 on 09/24/2019. Phillip Roach is not on metformin.  Lab Results  Component Value Date   HGBA1C 5.7 (H) 09/24/2019   Lab Results  Component Value Date   INSULIN 17.3 09/24/2019   At risk for diabetes mellitus. Phillip Roach is at higher than average risk for developing diabetes due to his obesity.   Assessment/Plan:   Vitamin D deficiency. Low Vitamin D level contributes to fatigue and are associated with obesity, breast, and colon cancer. He was given a refill on his Vitamin D, Ergocalciferol, (DRISDOL) 1.25 MG (50000 UNIT) CAPS capsule every week #4 with 0 refills and will follow-up for routine testing of  Vitamin D, at least 2-3 times per year to avoid over-replacement.   Prediabetes. Phillip Roach will continue to work on weight loss, exercise, and decreasing simple carbohydrates to help decrease the risk of diabetes. We may want to discuss initiation of metformin at his next appointment if he is still struggling to stay on the Category 4 Plan/Journaling.  At risk for diabetes mellitus. Phillip Roach was given approximately 15 minutes of diabetes education and counseling today. We discussed intensive lifestyle modifications today with an emphasis on weight loss as well as increasing exercise and decreasing simple carbohydrates in his diet. We also reviewed medication options with an emphasis on risk versus benefit of those discussed.   Repetitive spaced learning was employed today to elicit superior memory formation and behavioral change.  Class 3 severe obesity with serious comorbidity and body mass index (BMI) of 40.0 to 44.9 in adult, unspecified obesity type ([name]).  Phillip Roach is currently in the action stage of change. As such, his goal is to continue with weight loss efforts. He has agreed to the Category 4 Plan and will journal 2000-2100 calories and 150+ grams of protein daily.   Exercise goals: No exercise has been prescribed at this time.  Behavioral modification strategies: increasing lean protein intake, meal planning and cooking strategies, keeping healthy foods in the home and dealing with family or coworker sabotage.  Phillip Roach has agreed to follow-up with our clinic in 2-3 weeks. He was informed of the importance of frequent follow-up visits to maximize his  success with intensive lifestyle modifications for his multiple health conditions.   Objective:   Blood pressure 136/69, pulse 66, temperature 98.1 F (36.7 C), temperature source Oral, height 6\' 2"  (1.88 m), weight (!) 319 lb (144.7 kg), SpO2 98 %. Body mass index is 40.96 kg/m.  General: Cooperative, alert, well developed, in no acute  distress. HEENT: Conjunctivae and lids unremarkable. Cardiovascular: Regular rhythm.  Lungs: Normal work of breathing. Neurologic: No focal deficits.   Lab Results  Component Value Date   CREATININE 0.82 07/16/2019   BUN 8 07/16/2019   NA 139 07/16/2019   K 4.2 07/16/2019   CL 106 07/16/2019   CO2 23 07/16/2019   Lab Results  Component Value Date   ALT 34 07/16/2019   AST 19 07/16/2019   ALKPHOS 54 07/16/2019   BILITOT 0.8 07/16/2019   Lab Results  Component Value Date   HGBA1C 5.7 (H) 09/24/2019   Lab Results  Component Value Date   INSULIN 17.3 09/24/2019   Lab Results  Component Value Date   TSH 0.775 07/16/2019   Lab Results  Component Value Date   CHOL 173 09/24/2019   HDL 31 (L) 09/24/2019   LDLCALC 103 (H) 09/24/2019   LDLDIRECT 85.4 07/28/2007   TRIG 224 (H) 09/24/2019   CHOLHDL 5 06/27/2012   Lab Results  Component Value Date   WBC 8.0 07/16/2019   HGB 15.6 07/16/2019   HCT 45.6 07/16/2019   MCV 88.0 07/16/2019   PLT 243 07/16/2019   No results found for: IRON, TIBC, FERRITIN  Attestation Statements:   Reviewed by clinician on day of visit: allergies, medications, problem list, medical history, surgical history, family history, social history, and previous encounter notes.  I, Michaelene Song, am acting as transcriptionist for Coralie Common, MD   I have reviewed the above documentation for accuracy and completeness, and I agree with the above. - Jinny Blossom, MD

## 2020-01-03 MED FILL — VIT D2 1.25 MG (50,000 UNIT: 1.25 MG | 28 days supply | Qty: 4 | Fill #0

## 2020-01-07 ENCOUNTER — Other Ambulatory Visit (HOSPITAL_BASED_OUTPATIENT_CLINIC_OR_DEPARTMENT_OTHER): Payer: Self-pay

## 2020-01-07 ENCOUNTER — Encounter (INDEPENDENT_AMBULATORY_CARE_PROVIDER_SITE_OTHER): Payer: Self-pay

## 2020-01-07 MED FILL — AMOXICILLIN 500 MG CAPSULE: 500 | 10 days supply | Qty: 40 | Fill #0

## 2020-01-14 ENCOUNTER — Encounter (INDEPENDENT_AMBULATORY_CARE_PROVIDER_SITE_OTHER): Payer: Self-pay | Admitting: Family Medicine

## 2020-01-14 ENCOUNTER — Other Ambulatory Visit: Payer: Self-pay

## 2020-01-14 ENCOUNTER — Ambulatory Visit (INDEPENDENT_AMBULATORY_CARE_PROVIDER_SITE_OTHER): Payer: 59 | Admitting: Family Medicine

## 2020-01-14 VITALS — BP 136/87 | HR 81 | Temp 98.4°F | Ht 74.0 in | Wt 315.0 lb

## 2020-01-14 DIAGNOSIS — E559 Vitamin D deficiency, unspecified: Secondary | ICD-10-CM

## 2020-01-14 DIAGNOSIS — F3289 Other specified depressive episodes: Secondary | ICD-10-CM

## 2020-01-14 DIAGNOSIS — Z6841 Body Mass Index (BMI) 40.0 and over, adult: Secondary | ICD-10-CM

## 2020-01-15 ENCOUNTER — Ambulatory Visit (INDEPENDENT_AMBULATORY_CARE_PROVIDER_SITE_OTHER): Payer: 59 | Admitting: Family Medicine

## 2020-01-15 NOTE — Progress Notes (Signed)
Chief Complaint:   OBESITY Phillip Roach is here to discuss his progress with his obesity treatment plan along with follow-up of his obesity related diagnoses. Phillip Roach is on the Category 4 Plan or keeping a food journal and adhering to recommended goals of 2000-2100 calories and 150+ grams of protein daily and states he is following his eating plan approximately 75% of the time. Phillip Roach states he is active while outside doing yard work.   Today's visit was #: 8 Starting weight: 325 lbs Starting date: 09/24/2019 Today's weight: 315 lbs Today's date: 01/14/2020 Total lbs lost to date: 10 Total lbs lost since last in-office visit: 4  Interim History: Phillip Roach has been very compliant with following the plan especially when it comes to eating out. He is still experiencing increased temptation at work with leftover Halloween candy. He is working on Thanksgiving. His son's birthday is the day after Thanksgiving. He hasn't indulged in donuts or pizza since his last appointment. He voices that he isn't getting the most support at home.  Subjective:   1. Vitamin D deficiency Phillip Roach denies nausea, vomiting, or muscle weakness, notes fatigue. He is on prescription Vit D.  2. Other depression, with emotional eating Phillip Roach denies suicidal ideas or homicidal ideas. He is on Wellbutrin 300 mg daily.  Assessment/Plan:   1. Vitamin D deficiency Low Vitamin D level contributes to fatigue and are associated with obesity, breast, and colon cancer. Cheskel agreed to continue taking prescription Vitamin D 50,000 IU every week and will follow-up for routine testing of Vitamin D, at least 2-3 times per year to avoid over-replacement.  2. Other depression, with emotional eating Behavior modification techniques were discussed today to help Phillip Roach deal with his emotional/non-hunger eating behaviors. Phillip Roach will continue Wellbutrin, no refill needed. Orders and follow up as documented in patient record.   3. Class 3 severe  obesity with serious comorbidity and body mass index (BMI) of 40.0 to 44.9 in adult, unspecified obesity type [facility]) Phillip Roach is currently in the action stage of change. As such, his goal is to continue with weight loss efforts. He has agreed to the Category 4 Plan.   Exercise goals: All adults should avoid inactivity. Some physical activity is better than none, and adults who participate in any amount of physical activity gain some health benefits.  Behavioral modification strategies: increasing lean protein intake, meal planning and cooking strategies, keeping healthy foods in the home and dealing with family or coworker sabotage.  Phillip Roach has agreed to follow-up with our clinic in 3 weeks. He was informed of the importance of frequent follow-up visits to maximize his success with intensive lifestyle modifications for his multiple health conditions.   Objective:   Blood pressure 136/87, pulse 81, temperature 98.4 F (36.9 C), temperature source Oral, height 6\' 2"  (1.88 m), weight (!) 315 lb (142.9 kg), SpO2 97 %. Body mass index is 40.44 kg/m.  General: Cooperative, alert, well developed, in no acute distress. HEENT: Conjunctivae and lids unremarkable. Cardiovascular: Regular rhythm.  Lungs: Normal work of breathing. Neurologic: No focal deficits.   Lab Results  Component Value Date   CREATININE 0.82 07/16/2019   BUN 8 07/16/2019   NA 139 07/16/2019   K 4.2 07/16/2019   CL 106 07/16/2019   CO2 23 07/16/2019   Lab Results  Component Value Date   ALT 34 07/16/2019   AST 19 07/16/2019   ALKPHOS 54 07/16/2019   BILITOT 0.8 07/16/2019   Lab Results  Component Value Date  HGBA1C 5.7 (H) 09/24/2019   Lab Results  Component Value Date   INSULIN 17.3 09/24/2019   Lab Results  Component Value Date   TSH 0.775 07/16/2019   Lab Results  Component Value Date   CHOL 173 09/24/2019   HDL 31 (L) 09/24/2019   LDLCALC 103 (H) 09/24/2019   LDLDIRECT 85.4 07/28/2007   TRIG 224 (H)  09/24/2019   CHOLHDL 5 06/27/2012   Lab Results  Component Value Date   WBC 8.0 07/16/2019   HGB 15.6 07/16/2019   HCT 45.6 07/16/2019   MCV 88.0 07/16/2019   PLT 243 07/16/2019   No results found for: IRON, TIBC, FERRITIN  Attestation Statements:   Reviewed by clinician on day of visit: allergies, medications, problem list, medical history, surgical history, family history, social history, and previous encounter notes.  Time spent on visit including pre-visit chart review and post-visit care and charting was 15 minutes.    I, Trixie Dredge, am acting as transcriptionist for Coralie Common, MD.  I have reviewed the above documentation for accuracy and completeness, and I agree with the above. - Jinny Blossom, MD

## 2020-01-25 DIAGNOSIS — F329 Major depressive disorder, single episode, unspecified: Secondary | ICD-10-CM | POA: Diagnosis not present

## 2020-02-04 ENCOUNTER — Other Ambulatory Visit (INDEPENDENT_AMBULATORY_CARE_PROVIDER_SITE_OTHER): Payer: Self-pay | Admitting: Family Medicine

## 2020-02-04 ENCOUNTER — Encounter (INDEPENDENT_AMBULATORY_CARE_PROVIDER_SITE_OTHER): Payer: Self-pay | Admitting: Family Medicine

## 2020-02-04 ENCOUNTER — Ambulatory Visit (INDEPENDENT_AMBULATORY_CARE_PROVIDER_SITE_OTHER): Payer: 59 | Admitting: Family Medicine

## 2020-02-04 ENCOUNTER — Other Ambulatory Visit: Payer: Self-pay

## 2020-02-04 VITALS — BP 136/76 | HR 82 | Temp 98.1°F | Ht 74.0 in | Wt 323.0 lb

## 2020-02-04 DIAGNOSIS — R7303 Prediabetes: Secondary | ICD-10-CM

## 2020-02-04 DIAGNOSIS — Z9189 Other specified personal risk factors, not elsewhere classified: Secondary | ICD-10-CM | POA: Diagnosis not present

## 2020-02-04 DIAGNOSIS — E559 Vitamin D deficiency, unspecified: Secondary | ICD-10-CM | POA: Diagnosis not present

## 2020-02-04 DIAGNOSIS — Z6841 Body Mass Index (BMI) 40.0 and over, adult: Secondary | ICD-10-CM | POA: Diagnosis not present

## 2020-02-04 MED ORDER — VITAMIN D (ERGOCALCIFEROL) 1.25 MG (50000 UNIT) PO CAPS: 50000.0000 [IU] | ORAL_CAPSULE | ORAL | 0 refills | Status: DC

## 2020-02-04 MED FILL — VIT D2 1.25 MG (50,000 UNIT: 1.25 MG | 28 days supply | Qty: 4 | Fill #0

## 2020-02-05 NOTE — Progress Notes (Signed)
Chief Complaint:   OBESITY Phillip Roach is here to discuss his progress with his obesity treatment plan along with follow-up of his obesity related diagnoses. Phillip Roach is on the Category 4 Plan and states he is following his eating plan approximately 25% of the time. Phillip Roach states he is doing some walking.  Today's visit was #: 9 Starting weight: 325 lbs Starting date: 09/24/2019 Today's weight: 323 lbs Today's date: 02/04/2020 Total lbs lost to date: 2 lbs Total lbs lost since last in-office visit: 0  Interim History: Jaran says that he did some indulgent eating over the Thanksgiving holiday.  He is already back on the plan since the holiday.  He says he is shocked by his 8 pound weight gain.  He also had his son's birthday on Friday (day after Thanksgiving).  Besides that, he has been pretty on point.  Subjective:   1. Vitamin D deficiency Phillip Roach's Vitamin D level was 22.2 on 07/16/2019. He is currently taking prescription vitamin D 50,000 IU each week. He denies nausea, vomiting or muscle weakness.  He endorses fatigue.  2. Prediabetes Phillip Roach has a diagnosis of prediabetes based on his elevated HgA1c and was informed this puts him at greater risk of developing diabetes. He continues to work on diet and exercise to decrease his risk of diabetes. He denies nausea or hypoglycemia.  Last A1c 5.7, insulin 17.3.  He is not on medication.   Lab Results  Component Value Date   HGBA1C 5.7 (H) 09/24/2019   Lab Results  Component Value Date   INSULIN 17.3 09/24/2019   3. At risk for diabetes mellitus Phillip Roach is at higher than average risk for developing diabetes due to obesity.   Assessment/Plan:   1. Vitamin D deficiency Low Vitamin D level contributes to fatigue and are associated with obesity, breast, and colon cancer. He agrees to continue to take prescription Vitamin D @50 ,000 IU every week and will follow-up for routine testing of Vitamin D, at least 2-3 times per year to avoid  over-replacement.  -Refill Vitamin D, Ergocalciferol, (DRISDOL) 1.25 MG (50000 UNIT) CAPS capsule; Take 1 capsule (50,000 Units total) by mouth every 7 (seven) days.  Dispense: 4 capsule; Refill: 0  2. Prediabetes Phillip Roach will continue to work on weight loss, exercise, and decreasing simple carbohydrates to help decrease the risk of diabetes.  Repeat labs at next appointment.  3. At risk for diabetes mellitus Phillip Roach was given approximately 15 minutes of diabetes education and counseling today. We discussed intensive lifestyle modifications today with an emphasis on weight loss as well as increasing exercise and decreasing simple carbohydrates in his diet. We also reviewed medication options with an emphasis on risk versus benefit of those discussed.   Repetitive spaced learning was employed today to elicit superior memory formation and behavioral change.  4. Class 3 severe obesity with serious comorbidity and body mass index (BMI) of 40.0 to 44.9 in adult, unspecified obesity type [facility])  Phillip Roach is currently in the action stage of change. As such, his goal is to continue with weight loss efforts. He has agreed to the Category 4 Plan.   Exercise goals: All adults should avoid inactivity. Some physical activity is better than none, and adults who participate in any amount of physical activity gain some health benefits.  Behavioral modification strategies: increasing lean protein intake, meal planning and cooking strategies, keeping healthy foods in the home and holiday eating strategies .  Phillip Roach has agreed to follow-up with our clinic in 3  weeks. He was informed of the importance of frequent follow-up visits to maximize his success with intensive lifestyle modifications for his multiple health conditions.   Objective:   Blood pressure 136/76, pulse 82, temperature 98.1 F (36.7 C), temperature source Oral, height 6\' 2"  (1.88 m), weight (!) 323 lb (146.5 kg), SpO2 97 %. Body mass index is 41.47  kg/m.  General: Cooperative, alert, well developed, in no acute distress. HEENT: Conjunctivae and lids unremarkable. Cardiovascular: Regular rhythm.  Lungs: Normal work of breathing. Neurologic: No focal deficits.   Lab Results  Component Value Date   CREATININE 0.82 07/16/2019   BUN 8 07/16/2019   NA 139 07/16/2019   K 4.2 07/16/2019   CL 106 07/16/2019   CO2 23 07/16/2019   Lab Results  Component Value Date   ALT 34 07/16/2019   AST 19 07/16/2019   ALKPHOS 54 07/16/2019   BILITOT 0.8 07/16/2019   Lab Results  Component Value Date   HGBA1C 5.7 (H) 09/24/2019   Lab Results  Component Value Date   INSULIN 17.3 09/24/2019   Lab Results  Component Value Date   TSH 0.775 07/16/2019   Lab Results  Component Value Date   CHOL 173 09/24/2019   HDL 31 (L) 09/24/2019   LDLCALC 103 (H) 09/24/2019   LDLDIRECT 85.4 07/28/2007   TRIG 224 (H) 09/24/2019   CHOLHDL 5 06/27/2012   Lab Results  Component Value Date   WBC 8.0 07/16/2019   HGB 15.6 07/16/2019   HCT 45.6 07/16/2019   MCV 88.0 07/16/2019   PLT 243 07/16/2019   Attestation Statements:   Reviewed by clinician on day of visit: allergies, medications, problem list, medical history, surgical history, family history, social history, and previous encounter notes.  I, Water quality scientist, CMA, am acting as transcriptionist for Coralie Common, MD.  I have reviewed the above documentation for accuracy and completeness, and I agree with the above. - Jinny Blossom, MD

## 2020-02-13 ENCOUNTER — Ambulatory Visit: Payer: 59 | Admitting: Family Medicine

## 2020-02-18 MED FILL — VIT D2 1.25 MG (50,000 UNIT: 1.25 MG | 28 days supply | Qty: 4 | Fill #0

## 2020-02-25 ENCOUNTER — Other Ambulatory Visit: Payer: Self-pay

## 2020-02-25 ENCOUNTER — Other Ambulatory Visit (INDEPENDENT_AMBULATORY_CARE_PROVIDER_SITE_OTHER): Payer: Self-pay | Admitting: Family Medicine

## 2020-02-25 ENCOUNTER — Encounter (INDEPENDENT_AMBULATORY_CARE_PROVIDER_SITE_OTHER): Payer: Self-pay | Admitting: Family Medicine

## 2020-02-25 ENCOUNTER — Ambulatory Visit (INDEPENDENT_AMBULATORY_CARE_PROVIDER_SITE_OTHER): Payer: 59 | Admitting: Family Medicine

## 2020-02-25 VITALS — BP 118/82 | HR 73 | Temp 97.5°F | Ht 74.0 in | Wt 319.0 lb

## 2020-02-25 DIAGNOSIS — R7303 Prediabetes: Secondary | ICD-10-CM | POA: Diagnosis not present

## 2020-02-25 DIAGNOSIS — Z6841 Body Mass Index (BMI) 40.0 and over, adult: Secondary | ICD-10-CM

## 2020-02-25 DIAGNOSIS — Z9189 Other specified personal risk factors, not elsewhere classified: Secondary | ICD-10-CM | POA: Diagnosis not present

## 2020-02-25 DIAGNOSIS — E7849 Other hyperlipidemia: Secondary | ICD-10-CM

## 2020-02-25 DIAGNOSIS — E559 Vitamin D deficiency, unspecified: Secondary | ICD-10-CM | POA: Diagnosis not present

## 2020-02-25 MED ORDER — VITAMIN D (ERGOCALCIFEROL) 1.25 MG (50000 UNIT) PO CAPS: 50000.0000 [IU] | ORAL_CAPSULE | ORAL | 0 refills | Status: DC

## 2020-02-26 LAB — LIPID PANEL WITH LDL/HDL RATIO
Cholesterol, Total: 177 mg/dL (ref 100–199)
HDL: 33 mg/dL — ABNORMAL LOW (ref >39)
LDL Chol Calc (NIH): 116 mg/dL — ABNORMAL HIGH (ref 0–99)
LDL/HDL Ratio: 3.5 ratio (ref 0.0–3.6)
Triglycerides: 159 mg/dL — ABNORMAL HIGH (ref 0–149)
VLDL Cholesterol Cal: 28 mg/dL (ref 5–40)

## 2020-02-26 LAB — VITAMIN D 25 HYDROXY (VIT D DEFICIENCY, FRACTURES): Vit D, 25-Hydroxy: 37.6 ng/mL (ref 30.0–100.0)

## 2020-02-26 LAB — INSULIN, RANDOM: INSULIN: 9.7 u[IU]/mL (ref 2.6–24.9)

## 2020-02-26 LAB — HEMOGLOBIN A1C
Est. average glucose Bld gHb Est-mCnc: 117 mg/dL
Hgb A1c MFr Bld: 5.7 % — ABNORMAL HIGH (ref 4.8–5.6)

## 2020-02-26 NOTE — Progress Notes (Signed)
Chief Complaint:   OBESITY Phillip Roach is here to discuss his progress with his obesity treatment plan along with follow-up of his obesity related diagnoses. Phillip Roach is on the Category 4 Plan and states he is following his eating plan approximately 75-80% of the time. Phillip Roach states he is active while just doing daily duties.  Today's visit was #: 10 Starting weight: 325 lbs Starting date: 09/24/2019 Today's weight: 319 lbs Today's date: 02/25/2020 Total lbs lost to date: 6 Total lbs lost since last in-office visit: 4  Interim History: Phillip Roach is following the plan a little more strictly than his last visit. He has 2 sick kids at home so the next few days are staying at home. He is still working night shifts frequently (transitionaing to day shift in February). He voices he is having issues with fatigue and a questionable flare up of depression.  Subjective:   1. Pre-diabetes Phillip Roach's last A1c was 5.7, and he is not on medications. He did experience a weight loss since his last appointment.  2. Vitamin D deficiency Phillip Roach denies nausea, vomiting, or muscle weakness, but notes fatigue. He is on prescription Vit D.  3. Other hyperlipidemia Phillip Roach's last LDL was elevated, and he is not on statin.  4. At risk for diabetes mellitus Phillip Roach is at higher than average risk for developing diabetes due to obesity.   Assessment/Plan:   1. Pre-diabetes Phillip Roach will continue to work on weight loss, exercise, and decreasing simple carbohydrates to help decrease the risk of diabetes. We will check labs today.  - Hemoglobin A1c - Insulin, random  2. Vitamin D deficiency Low Vitamin D level contributes to fatigue and are associated with obesity, breast, and colon cancer. We will check labs today, and we will refill prescription Vitamin D for 1 month. Phillip Roach will follow-up for routine testing of Vitamin D, at least 2-3 times per year to avoid over-replacement.  - Vitamin D, Ergocalciferol, (DRISDOL) 1.25 MG  (50000 UNIT) CAPS capsule; Take 1 capsule (50,000 Units total) by mouth every 7 (seven) days.  Dispense: 4 capsule; Refill: 0 - VITAMIN D 25 Hydroxy (Vit-D Deficiency, Fractures)  3. Other hyperlipidemia Cardiovascular risk and specific lipid/LDL goals reviewed.  We discussed several lifestyle modifications today and Phillip Roach will continue to work on diet, exercise and weight loss efforts. We will check labs today. Orders and follow up as documented in patient record.   Counseling Intensive lifestyle modifications are the first line treatment for this issue. . Dietary changes: Increase soluble fiber. Decrease simple carbohydrates. . Exercise changes: Moderate to vigorous-intensity aerobic activity 150 minutes per week if tolerated. . Lipid-lowering medications: see documented in medical record.  - Lipid Panel With LDL/HDL Ratio  4. At risk for diabetes mellitus Phillip Roach was given approximately 15 minutes of diabetes education and counseling today. We discussed intensive lifestyle modifications today with an emphasis on weight loss as well as increasing exercise and decreasing simple carbohydrates in his diet. We also reviewed medication options with an emphasis on risk versus benefit of those discussed.   Repetitive spaced learning was employed today to elicit superior memory formation and behavioral change.  5. Class 3 severe obesity with serious comorbidity and body mass index (BMI) of 40.0 to 44.9 in adult, unspecified obesity type Phillip Roach) Phillip Roach is currently in the action stage of change. As such, his goal is to continue with weight loss efforts. He has agreed to the Category 4 Plan.   Exercise goals: As is.  Behavioral modification strategies:  increasing lean protein intake, meal planning and cooking strategies, keeping healthy foods in the home and planning for success.  Phillip Roach has agreed to follow-up with our clinic in 3 weeks. He was informed of the importance of frequent follow-up visits to  maximize his success with intensive lifestyle modifications for his multiple health conditions.   Phillip Roach was informed we would discuss his lab results at his next visit unless there is a critical issue that needs to be addressed sooner. Phillip Roach agreed to keep his next visit at the agreed upon time to discuss these results.  Objective:   Blood pressure 118/82, pulse 73, temperature (!) 97.5 F (36.4 C), temperature source Oral, height 6\' 2"  (1.88 m), weight (!) 319 lb (144.7 kg), SpO2 97 %. Body mass index is 40.96 kg/m.  General: Cooperative, alert, well developed, in no acute distress. HEENT: Conjunctivae and lids unremarkable. Cardiovascular: Regular rhythm.  Lungs: Normal work of breathing. Neurologic: No focal deficits.   Lab Results  Component Value Date   CREATININE 0.82 07/16/2019   BUN 8 07/16/2019   NA 139 07/16/2019   K 4.2 07/16/2019   CL 106 07/16/2019   CO2 23 07/16/2019   Lab Results  Component Value Date   ALT 34 07/16/2019   AST 19 07/16/2019   ALKPHOS 54 07/16/2019   BILITOT 0.8 07/16/2019   Lab Results  Component Value Date   HGBA1C 5.7 (H) 02/25/2020   HGBA1C 5.7 (H) 09/24/2019   Lab Results  Component Value Date   INSULIN WILL FOLLOW 02/25/2020   INSULIN 17.3 09/24/2019   Lab Results  Component Value Date   TSH 0.775 07/16/2019   Lab Results  Component Value Date   CHOL 177 02/25/2020   HDL 33 (L) 02/25/2020   LDLCALC 116 (H) 02/25/2020   LDLDIRECT 85.4 07/28/2007   TRIG 159 (H) 02/25/2020   CHOLHDL 5 06/27/2012   Lab Results  Component Value Date   WBC 8.0 07/16/2019   HGB 15.6 07/16/2019   HCT 45.6 07/16/2019   MCV 88.0 07/16/2019   PLT 243 07/16/2019   No results found for: IRON, TIBC, FERRITIN  Attestation Statements:   Reviewed by clinician on day of visit: allergies, medications, problem list, medical history, surgical history, family history, social history, and previous encounter notes.   I, Trixie Dredge, am acting as  transcriptionist for Coralie Common, MD.  I have reviewed the above documentation for accuracy and completeness, and I agree with the above. - Jinny Blossom, MD

## 2020-03-12 DIAGNOSIS — F329 Major depressive disorder, single episode, unspecified: Secondary | ICD-10-CM | POA: Diagnosis not present

## 2020-03-14 MED FILL — buPROPion HCL ER (XL) 300 M: 300 | 90 days supply | Qty: 90 | Fill #1

## 2020-03-17 ENCOUNTER — Other Ambulatory Visit (HOSPITAL_BASED_OUTPATIENT_CLINIC_OR_DEPARTMENT_OTHER): Payer: Self-pay | Admitting: Internal Medicine

## 2020-03-17 ENCOUNTER — Ambulatory Visit: Payer: 59 | Attending: Internal Medicine

## 2020-03-17 DIAGNOSIS — Z23 Encounter for immunization: Secondary | ICD-10-CM

## 2020-03-17 MED FILL — VIT D2 1.25 MG (50,000 UNIT: 1.25 MG | 28 days supply | Qty: 4 | Fill #0

## 2020-03-17 NOTE — Progress Notes (Signed)
   Covid-19 Vaccination Clinic  Name:  ISAC LINCKS    MRN: [id number] DOB: 1980-10-03  03/17/2020  Mr. Mallinger was observed post Covid-19 immunization for 15 minutes without incident. He was provided with Vaccine Information Sheet and instruction to access the V-Safe system.   Mr. Homes was instructed to call 911 with any severe reactions post vaccine: [name] Difficulty breathing  . Swelling of face and throat  . A fast heartbeat  . A bad rash all over body  . Dizziness and weakness   Immunizations Administered    Name Date Dose VIS Date Route   Moderna Covid-19 Booster Vaccine 03/17/2020  1:29 PM 0.25 mL 12/26/2019 Intramuscular   Manufacturer: Levan Hur[employer]   Lot: [id number]   [place]: [phone]

## 2020-03-18 MED FILL — MODERNA COVID-19 VACCINE 10: 100 | 28 days supply | Qty: 0 | Fill #0

## 2020-03-20 ENCOUNTER — Encounter (INDEPENDENT_AMBULATORY_CARE_PROVIDER_SITE_OTHER): Payer: Self-pay | Admitting: Family Medicine

## 2020-03-20 ENCOUNTER — Encounter (INDEPENDENT_AMBULATORY_CARE_PROVIDER_SITE_OTHER): Payer: Self-pay

## 2020-03-20 ENCOUNTER — Telehealth (INDEPENDENT_AMBULATORY_CARE_PROVIDER_SITE_OTHER): Payer: 59 | Admitting: Family Medicine

## 2020-03-20 ENCOUNTER — Other Ambulatory Visit: Payer: Self-pay

## 2020-03-20 ENCOUNTER — Other Ambulatory Visit (INDEPENDENT_AMBULATORY_CARE_PROVIDER_SITE_OTHER): Payer: Self-pay | Admitting: Family Medicine

## 2020-03-20 ENCOUNTER — Telehealth (INDEPENDENT_AMBULATORY_CARE_PROVIDER_SITE_OTHER): Payer: Self-pay

## 2020-03-20 DIAGNOSIS — E559 Vitamin D deficiency, unspecified: Secondary | ICD-10-CM

## 2020-03-20 DIAGNOSIS — R7303 Prediabetes: Secondary | ICD-10-CM | POA: Diagnosis not present

## 2020-03-20 DIAGNOSIS — Z6841 Body Mass Index (BMI) 40.0 and over, adult: Secondary | ICD-10-CM

## 2020-03-20 MED ORDER — METFORMIN HCL 500 MG PO TABS: 500.0000 mg | ORAL_TABLET | Freq: Every day | ORAL | 0 refills | Status: DC

## 2020-03-20 MED FILL — METFORMIN HCL 500 MG TABS: 500 | 30 days supply | Qty: 30 | Fill #0

## 2020-03-20 NOTE — Telephone Encounter (Signed)
I connected with  Phillip Roach on 03/20/20 by a video enabled telemedicine application and verified that I am speaking with the correct person using two identifiers.   I discussed the limitations of evaluation and management by telemedicine. The patient expressed understanding and agreed to proceed.

## 2020-03-26 ENCOUNTER — Ambulatory Visit (INDEPENDENT_AMBULATORY_CARE_PROVIDER_SITE_OTHER): Payer: 59 | Admitting: Otolaryngology

## 2020-03-26 NOTE — Progress Notes (Signed)
TeleHealth Visit:  Due to the COVID-19 pandemic, this visit was completed with telemedicine (audio/video) technology to reduce patient and provider exposure as well as to preserve personal protective equipment.   Phillip Roach has verbally consented to this TeleHealth visit. The patient is located at home, the provider is located at the [facility] office. The participants in this visit include the listed provider and patient. The visit was conducted today via MyChart video.  Chief Complaint: OBESITY [name] is here to discuss his progress with his obesity treatment plan along with follow-up of his obesity related diagnoses. [name] is on the Category 4 Plan and states he is following his eating plan approximately 80% of the time. 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  Today's visit was #: 11 Starting weight: 325 lbs Starting date: 09/24/2019  Interim History: Phillip Roach had a good holiday.  He feels he was fairly compliant with the meal plan and did have a few pieces of pie as indulgences, but otherwise was fairly on point with getting nutrition in.  He says he is going to start decreasing his carb intake.  Subjective:   1. Prediabetes Phillip Roach has a diagnosis of prediabetes based on his elevated HgA1c and was informed this puts him at greater risk of developing diabetes. He continues to work on diet and exercise to decrease his risk of diabetes. He denies nausea or hypoglycemia.  He has pondered metformin usage.  Lab Results  Component Value Date   HGBA1C 5.7 (H) 02/25/2020   Lab Results  Component Value Date   INSULIN 9.7 02/25/2020   INSULIN 17.3 09/24/2019   2. Vitamin D deficiency Phillip Roach's Vitamin D level was 37.6 on 02/25/2020. He is currently taking prescription vitamin D 50,000 IU each week. He denies nausea, vomiting or muscle weakness.  He endorses fatigue.  Assessment/Plan:   1. Prediabetes Phillip Roach will continue to work on weight loss, exercise, and  decreasing simple carbohydrates to help decrease the risk of diabetes.  Will start metformin 500 mg daily, as per below.  -Start metFORMIN (GLUCOPHAGE) 500 MG tablet; Take 1 tablet (500 mg total) by mouth daily with breakfast.  Dispense: 30 tablet; Refill: 0  2. Vitamin D deficiency Low Vitamin D level contributes to fatigue and are associated with obesity, breast, and colon cancer. He agrees to continue to take prescription Vitamin D @50 ,000 IU every week and will follow-up for routine testing of Vitamin D, at least 2-3 times per year to avoid over-replacement.  3. Class 3 severe obesity with serious comorbidity and body mass index (BMI) of 40.0 to 44.9 in adult, unspecified obesity type [facility])  Phillip Roach is currently in the action stage of change. As such, his goal is to continue with weight loss efforts. He has agreed to the Category 4 Plan.   Exercise goals: All adults should avoid inactivity. Some physical activity is better than none, and adults who participate in any amount of physical activity gain some health benefits.  Behavioral modification strategies: increasing lean protein intake, meal planning and cooking strategies, keeping healthy foods in the home and planning for success.  Phillip Roach has agreed to follow-up with our clinic in 2 weeks. He was informed of the importance of frequent follow-up visits to maximize his success with intensive lifestyle modifications for his multiple health conditions.  Objective:   VITALS: Per patient if applicable, see vitals. GENERAL: Alert and in no acute distress. CARDIOPULMONARY: No increased WOB. Speaking in clear sentences.  PSYCH: Pleasant  and cooperative. Speech normal rate and rhythm. Affect is appropriate. Insight and judgement are appropriate. Attention is focused, linear, and appropriate.  NEURO: Oriented as arrived to appointment on time with no prompting.   Lab Results  Component Value Date   CREATININE 0.82 07/16/2019   BUN 8 07/16/2019    NA 139 07/16/2019   K 4.2 07/16/2019   CL 106 07/16/2019   CO2 23 07/16/2019   Lab Results  Component Value Date   ALT 34 07/16/2019   AST 19 07/16/2019   ALKPHOS 54 07/16/2019   BILITOT 0.8 07/16/2019   Lab Results  Component Value Date   HGBA1C 5.7 (H) 02/25/2020   HGBA1C 5.7 (H) 09/24/2019   Lab Results  Component Value Date   INSULIN 9.7 02/25/2020   INSULIN 17.3 09/24/2019   Lab Results  Component Value Date   TSH 0.775 07/16/2019   Lab Results  Component Value Date   CHOL 177 02/25/2020   HDL 33 (L) 02/25/2020   LDLCALC 116 (H) 02/25/2020   LDLDIRECT 85.4 07/28/2007   TRIG 159 (H) 02/25/2020   CHOLHDL 5 06/27/2012   Lab Results  Component Value Date   WBC 8.0 07/16/2019   HGB 15.6 07/16/2019   HCT 45.6 07/16/2019   MCV 88.0 07/16/2019   PLT 243 07/16/2019   Attestation Statements:   Reviewed by clinician on day of visit: allergies, medications, problem list, medical history, surgical history, family history, social history, and previous encounter notes.  I, Water quality scientist, CMA, am acting as transcriptionist for Coralie Common, MD.  I have reviewed the above documentation for accuracy and completeness, and I agree with the above. - Jinny Blossom, MD

## 2020-04-03 ENCOUNTER — Ambulatory Visit (INDEPENDENT_AMBULATORY_CARE_PROVIDER_SITE_OTHER): Payer: 59 | Admitting: Otolaryngology

## 2020-04-03 ENCOUNTER — Encounter (INDEPENDENT_AMBULATORY_CARE_PROVIDER_SITE_OTHER): Payer: Self-pay | Admitting: Family Medicine

## 2020-04-03 ENCOUNTER — Other Ambulatory Visit (INDEPENDENT_AMBULATORY_CARE_PROVIDER_SITE_OTHER): Payer: Self-pay | Admitting: Family Medicine

## 2020-04-03 ENCOUNTER — Other Ambulatory Visit: Payer: Self-pay

## 2020-04-03 ENCOUNTER — Telehealth (INDEPENDENT_AMBULATORY_CARE_PROVIDER_SITE_OTHER): Payer: 59 | Admitting: Family Medicine

## 2020-04-03 DIAGNOSIS — R7303 Prediabetes: Secondary | ICD-10-CM

## 2020-04-03 DIAGNOSIS — Z6841 Body Mass Index (BMI) 40.0 and over, adult: Secondary | ICD-10-CM

## 2020-04-03 DIAGNOSIS — E559 Vitamin D deficiency, unspecified: Secondary | ICD-10-CM

## 2020-04-03 MED ORDER — VITAMIN D (ERGOCALCIFEROL) 1.25 MG (50000 UNIT) PO CAPS: 50000.0000 [IU] | ORAL_CAPSULE | ORAL | 0 refills | Status: DC

## 2020-04-04 ENCOUNTER — Ambulatory Visit: Payer: 59 | Admitting: Family Medicine

## 2020-04-04 DIAGNOSIS — F329 Major depressive disorder, single episode, unspecified: Secondary | ICD-10-CM | POA: Diagnosis not present

## 2020-04-07 NOTE — Progress Notes (Signed)
TeleHealth Visit:  Due to the COVID-19 pandemic, this visit was completed with telemedicine (audio/video) technology to reduce patient and provider exposure as well as to preserve personal protective equipment.   Phillip Roach has verbally consented to this TeleHealth visit. The patient is located at home, the provider is located at the [facility] office. The participants in this visit include the listed provider and patient. The visit was conducted today via MyChart Video.    Chief Complaint: OBESITY Phillip Roach is here to discuss his progress with his obesity treatment plan along with follow-up of his obesity related diagnoses. Phillip Roach is on the Category 4 Plan and states he is following his eating plan approximately 80% of the time. Phillip Roach states he played in snow for 20 minutes 2 times per week.  Today's visit was #: 12 Starting weight: 325 lbs Starting date: 09/24/2019  Interim History: Phillip Roach voices he's experiencing symptoms of a sinus infection and did home test yesterday (negative), and PCR test this morning. He developed symptoms three days ago. Appetite has decrease ( started metformin after last appointment). Phillip Roach is following plan as closely as he can. No new obstacles in next few weeks.  Subjective:   Phillip Roach deficiency  Phillip Roach's last Phillip Roach level was 37.6. Phillip Roach is on prescription Phillip Roach. He denies nausea, vomiting, or muscle weakness but notes fatigue.  Prediabetes Last A1c was 5.7 and insulin 9.7. Phillip Roach is on metformin 500 mg daily with no GI side effects.   Assessment/Plan:   1. Phillip Roach deficiency Low Phillip Roach level contributes to fatigue and are associated with obesity, breast, and colon cancer. He agrees to continue to take prescription Phillip Roach @50 ,000 IU every week and will follow-up for routine testing of Phillip Roach, at least 2-3 times per year to avoid over-replacement. Will refill Phillip Roach 50,000 unit weekly #4 no refill.  - Phillip Roach,  Ergocalciferol, (DRISDOL) 1.25 MG (50000 UNIT) CAPS capsule; Take 1 capsule (50,000 Units total) by mouth every 7 (seven) days.  Dispense: 4 capsule; Refill: 0  2. Prediabetes Phillip Roach will continue to work on weight loss, exercise, and decreasing simple carbohydrates to help decrease the risk of diabetes. Phillip Roach will continue metformin with no refill needed.  3. Class 3 severe obesity with serious comorbidity and body mass index (BMI) of 40.0 to 44.9 in adult, unspecified obesity type Phillip Roach)  Phillip Roach is currently in the action stage of change. As such, his goal is to continue with weight loss efforts. He has agreed to the Category 4 Plan.   Exercise goals: Some exercise.  Behavioral modification strategies: increasing lean protein intake, meal planning and cooking strategies, keeping healthy foods in the home and planning for success.  Phillip Roach has agreed to follow-up with our clinic in 2-3 weeks on 04/15/2020 at 2:40 pm. He was informed of the importance of frequent follow-up visits to maximize his success with intensive lifestyle modifications for his multiple health conditions.   Objective:   VITALS: Per patient if applicable, see vitals. GENERAL: Alert and in no acute distress. CARDIOPULMONARY: No increased WOB. Speaking in clear sentences.  PSYCH: Pleasant and cooperative. Speech normal rate and rhythm. Affect is appropriate. Insight and judgement are appropriate. Attention is focused, linear, and appropriate.  NEURO: Oriented as arrived to appointment on time with no prompting.   Lab Results  Component Value Date   CREATININE 0.82 07/16/2019   BUN 8 07/16/2019   NA 139 07/16/2019   K 4.2 07/16/2019   CL  106 07/16/2019   CO2 23 07/16/2019   Lab Results  Component Value Date   ALT 34 07/16/2019   AST 19 07/16/2019   ALKPHOS 54 07/16/2019   BILITOT 0.8 07/16/2019   Lab Results  Component Value Date   HGBA1C 5.7 (H) 02/25/2020   HGBA1C 5.7 (H) 09/24/2019   Lab Results  Component  Value Date   INSULIN 9.7 02/25/2020   INSULIN 17.3 09/24/2019   Lab Results  Component Value Date   TSH 0.775 07/16/2019   Lab Results  Component Value Date   CHOL 177 02/25/2020   HDL 33 (L) 02/25/2020   LDLCALC 116 (H) 02/25/2020   LDLDIRECT 85.4 07/28/2007   TRIG 159 (H) 02/25/2020   CHOLHDL 5 06/27/2012   Lab Results  Component Value Date   WBC 8.0 07/16/2019   HGB 15.6 07/16/2019   HCT 45.6 07/16/2019   MCV 88.0 07/16/2019   PLT 243 07/16/2019   No results found for: IRON, TIBC, FERRITIN  Attestation Statements:   Reviewed by clinician on day of visit: allergies, medications, problem list, medical history, surgical history, family history, social history, and previous encounter notes.  I have reviewed the above documentation for accuracy and completeness, and I agree with the above. - Jinny Blossom, MD

## 2020-04-08 ENCOUNTER — Ambulatory Visit: Payer: 59 | Admitting: Family Medicine

## 2020-04-11 DIAGNOSIS — F329 Major depressive disorder, single episode, unspecified: Secondary | ICD-10-CM | POA: Diagnosis not present

## 2020-04-15 ENCOUNTER — Encounter (INDEPENDENT_AMBULATORY_CARE_PROVIDER_SITE_OTHER): Payer: Self-pay | Admitting: Otolaryngology

## 2020-04-15 ENCOUNTER — Ambulatory Visit (INDEPENDENT_AMBULATORY_CARE_PROVIDER_SITE_OTHER): Payer: 59 | Admitting: Family Medicine

## 2020-04-15 ENCOUNTER — Ambulatory Visit (INDEPENDENT_AMBULATORY_CARE_PROVIDER_SITE_OTHER): Payer: 59 | Admitting: Otolaryngology

## 2020-04-15 ENCOUNTER — Other Ambulatory Visit: Payer: Self-pay

## 2020-04-15 ENCOUNTER — Encounter (INDEPENDENT_AMBULATORY_CARE_PROVIDER_SITE_OTHER): Payer: Self-pay | Admitting: Family Medicine

## 2020-04-15 VITALS — BP 131/85 | HR 102 | Temp 98.1°F | Ht 74.0 in | Wt 320.0 lb

## 2020-04-15 VITALS — Temp 95.0°F

## 2020-04-15 DIAGNOSIS — Z6841 Body Mass Index (BMI) 40.0 and over, adult: Secondary | ICD-10-CM | POA: Diagnosis not present

## 2020-04-15 DIAGNOSIS — D490 Neoplasm of unspecified behavior of digestive system: Secondary | ICD-10-CM | POA: Diagnosis not present

## 2020-04-15 DIAGNOSIS — Z9189 Other specified personal risk factors, not elsewhere classified: Secondary | ICD-10-CM | POA: Diagnosis not present

## 2020-04-15 DIAGNOSIS — F3289 Other specified depressive episodes: Secondary | ICD-10-CM | POA: Diagnosis not present

## 2020-04-15 DIAGNOSIS — R7303 Prediabetes: Secondary | ICD-10-CM | POA: Diagnosis not present

## 2020-04-15 NOTE — Progress Notes (Signed)
HPI: Phillip Roach is a 40 y.o. 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  He has had this for several years and was noted on a CT scan in 2019.  He underwent ultrasound in June of last year that measured a 3.7 cm left parotid mass.  He feels like it has gotten larger and is little bit uncomfortable to lay on this side.  He presents today to discuss excision..  Past Medical History:  Diagnosis Date  . ADHD   . ADHD (attention deficit hyperactivity disorder)   . Asthma   . Back pain   . Depression   . Fatigue   . GERD (gastroesophageal reflux disease)   . Heartburn   . Hypertension   . Joint pain   . Lactose intolerance   . Overweight   . Sleep apnea   . Vitamin D deficiency    Past Surgical History:  Procedure Laterality Date  . FOREHEAD RECONSTRUCTION Right    Right Temple Shrapnel Removed  . PILONIDAL CYST DRAINAGE     Social History   Socioeconomic History  . Marital status: Married    Spouse name: Not on file  . Number of children: Not on file  . Years of education: Not on file  . Highest education level: Not on file  Occupational History  . Occupation: Paramedic  Tobacco Use  . Smoking status: Never Smoker  . Smokeless tobacco: Never Used  Substance and Sexual Activity  . Alcohol use: Yes    Comment: couple times a month  . Drug use: No  . Sexual activity: Not on file  Other Topics Concern  . Not on file  Social History Narrative   Married and has baby on the way    Works for [employer] to [employer]   Social Determinants of Radio broadcast assistant Strain: Not on [employer] Insecurity: Not on file  Transportation Needs: Not on file  Physical Activity: Not on file  Stress: Not on file  Social Connections: Not on file   Family History  Problem Relation Age of Onset  . GER disease Mother   . Depression Mother   . Alcohol abuse Mother   . Obesity Mother   . Anxiety disorder Father   . Hypertension Father   . Sleep apnea  Father   . Obesity Father    Allergies  Allergen Reactions  . Shellfish Allergy     Iodine ok per pt  . Keflex [Cephalexin] Rash   Prior to Admission medications   Medication Sig Start Date End Date Taking? Authorizing Provider  acetaminophen (TYLENOL) 500 MG tablet Take 1 tablet (500 mg total) by mouth every 6 (six) hours as needed. 07/16/19   Chase Picket, MD  b complex vitamins tablet Take 1 tablet by mouth daily.    [provider]  buPROPion (WELLBUTRIN XL) 300 MG 24 hr tablet Take 1 tablet (300 mg total) by mouth daily. 08/14/19   [name], MD  ibuprofen (ADVIL,MOTRIN) 200 MG tablet Take 200 mg by mouth every 6 (six) hours as needed for pain.    [provider]  loratadine (CLARITIN) 10 MG tablet Take 10 mg by mouth daily.    [provider]  metFORMIN (GLUCOPHAGE) 500 MG tablet Take 1 tablet (500 mg total) by mouth daily with breakfast. 03/20/20   Laqueta Linden, MD  methylphenidate 54 MG PO CR tablet Take 54 mg by mouth every  morning.    [provider]  triamcinolone (NASACORT) 55 MCG/ACT AERO nasal inhaler Place 2 sprays into the nose daily.    [provider]  Vitamin D, Ergocalciferol, (DRISDOL) 1.25 MG (50000 UNIT) CAPS capsule Take 1 capsule (50,000 Units total) by mouth every 7 (seven) days. 04/03/20   Laqueta Linden, MD     Positive ROS: Otherwise negative  All other systems have been reviewed and were otherwise negative with the exception of those mentioned in the HPI and as above.  Physical Exam: Constitutional: Alert, well-appearing, no acute distress Ears: External ears without lesions or tenderness. Ear canals are clear bilaterally with intact, clear TMs.  Nasal: External nose without lesions.. Clear nasal passages Oral: Lips and gums without lesions. Tongue and palate mucosa without lesions. Posterior oropharynx clear. Neck: No palpable adenopathy or masses.  Patient has a large 4 cm parotid  mass on the left side that sits just below the angle of the jaw.  He has no palpable adenopathy lower in the neck.  He has normal facial nerve function. Lungs: Clear to auscultation Cardiac exam: Regular rate and rhythm without murmur. Respiratory: Breathing comfortably  Skin: No facial/neck lesions or rash noted.  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  Plan: Discussed excision of this as well as risk to the facial nerve.  We will plan on scheduling this sometime in the next month at [facility] day surgery with overnight stay.   Radene Journey, MD

## 2020-04-17 NOTE — Progress Notes (Signed)
Chief Complaint:   OBESITY Phillip Roach is here to discuss his progress with his obesity treatment plan along with follow-up of his obesity related diagnoses. [name] is on the Category 4 Plan and states he is following his eating plan approximately 80% of the time. [name] states he is working around [employer] and park.  Today's visit was #: 63 Starting weight: 325 lbs Starting date: 09/24/2019 Today's weight: 320 lbs Today's date: 04/15/2020 Total lbs lost to date: 5 lbs Total lbs lost since last in-office visit: 0  Interim History: [name] had a full few weeks. His dog died and his phone stopped working. The last dew days have been the most difficult to follow meal plan. He does realize that his wife has been doing some sabotage for him in the of food prep and plan.  Subjective:   1. Other depression, with emotional eating Pt denies suicidal or homicidal ideations. Pt is recognizing he is capable of recognizing he is about to emotionally eat but is not able to stop from eating.  2. Pre-diabetes Pt's A1c 5.74 and insulin level 9.7. He is on Metformin and is without GI side effects. He reports some decrease in cravings on Metformin.  Lab Results  Component Value Date   HGBA1C 5.7 (H) 02/25/2020   Lab Results  Component Value Date   INSULIN 9.7 02/25/2020   INSULIN 17.3 09/24/2019     3. At risk for deficient intake of food The patient is at a higher than average risk of deficient intake of food due to thoughts on portion control and family expectations.   Assessment/Plan:   1. Other depression, with emotional eating Behavior modification techniques were discussed today to help Phillip Roach deal with his emotional/non-hunger eating behaviors.  Orders and follow up as documented in patient record. Refer to Dr. Mallie Mussel. Continue Wellbutrin and Concerta.  2. Pre-diabetes Phillip Roach will continue to work on weight loss, exercise, and decreasing simple carbohydrates to help decrease the risk of  diabetes. Continue Metformin. No refill needed at this time.  3. At risk for deficient intake of food Phillip Roach was given approximately 15 minutes of deficit intake of food prevention counseling today. Phillip Roach is at risk for eating too few calories based on current food recall. He was encouraged to focus on meeting caloric and protein goals according to his recommended meal plan.   4. Class 3 severe obesity with serious comorbidity and body mass index (BMI) of 40.0 to 44.9 in adult, unspecified obesity type [facility]) Phillip Roach is currently in the action stage of change. As such, his goal is to continue with weight loss efforts. He has agreed to the Category 4 Plan.   Exercise goals: As is  Behavioral modification strategies: increasing lean protein intake, meal planning and cooking strategies, keeping healthy foods in the home and planning for success.  Phillip Roach has agreed to follow-up with our clinic in 2 weeks. He was informed of the importance of frequent follow-up visits to maximize his success with intensive lifestyle modifications for his multiple health conditions.   Objective:   Blood pressure 131/85, pulse (!) 102, temperature 98.1 F (36.7 C), temperature source Oral, height 6\' 2"  (1.88 m), weight (!) 320 lb (145.2 kg), SpO2 95 %. Body mass index is 41.09 kg/m.  General: Cooperative, alert, well developed, in no acute distress. HEENT: Conjunctivae and lids unremarkable. Cardiovascular: Regular rhythm.  Lungs: Normal work of breathing. Neurologic: No focal deficits.   Lab Results  Component Value Date   CREATININE  0.82 07/16/2019   BUN 8 07/16/2019   NA 139 07/16/2019   K 4.2 07/16/2019   CL 106 07/16/2019   CO2 23 07/16/2019   Lab Results  Component Value Date   ALT 34 07/16/2019   AST 19 07/16/2019   ALKPHOS 54 07/16/2019   BILITOT 0.8 07/16/2019   Lab Results  Component Value Date   HGBA1C 5.7 (H) 02/25/2020   HGBA1C 5.7 (H) 09/24/2019   Lab Results  Component Value Date    INSULIN 9.7 02/25/2020   INSULIN 17.3 09/24/2019   Lab Results  Component Value Date   TSH 0.775 07/16/2019   Lab Results  Component Value Date   CHOL 177 02/25/2020   HDL 33 (L) 02/25/2020   LDLCALC 116 (H) 02/25/2020   LDLDIRECT 85.4 07/28/2007   TRIG 159 (H) 02/25/2020   CHOLHDL 5 06/27/2012   Lab Results  Component Value Date   WBC 8.0 07/16/2019   HGB 15.6 07/16/2019   HCT 45.6 07/16/2019   MCV 88.0 07/16/2019   PLT 243 07/16/2019   No results found for: IRON, TIBC, FERRITIN  Attestation Statements:   Reviewed by clinician on day of visit: allergies, medications, problem list, medical history, surgical history, family history, social history, and previous encounter notes.  Coral Ceo, am acting as transcriptionist for Coralie Common, MD.   I have reviewed the above documentation for accuracy and completeness, and I agree with the above. - Jinny Blossom, MD

## 2020-04-22 NOTE — Progress Notes (Signed)
Office: [phone]  /  Fax: [phone]    Date: April 29, 2020   Appointment Start Time: 8:59am Duration: 66 minutes Provider: Glennie Roach, Psy.D. Type of Session: Intake for Individual Therapy  Location of Patient: Home Location of Provider: Provider's Home (private office) Type of Contact: Telepsychological Visit via MyChart Video Visit  Informed Consent: Prior to proceeding with today's appointment, two pieces of identifying information were obtained. In addition, Phillip Roach's physical location at the time of this appointment was obtained as well a phone number he could be reached at in the event of technical difficulties. Phillip Roach and this provider participated in today's telepsychological service.   The provider's role was explained to Phillip Roach. The provider reviewed and discussed issues of confidentiality, privacy, and limits therein (e.g., reporting obligations). In addition to verbal informed consent, written informed consent for psychological services was obtained prior to the initial appointment. Since the clinic is not a 24/7 crisis center, mental health emergency resources were shared and this  provider explained MyChart, e-mail, voicemail, and/or other messaging systems should be utilized only for non-emergency reasons. This provider also explained that information obtained during appointments will be placed in Court's medical record and relevant information will be shared with other providers at [facility] for coordination of care. Moreover, Phillip Roach agreed information may be shared with other [facility] providers as needed for coordination of care. By signing the service agreement document, Phillip Roach provided written consent for coordination of care. Prior to initiating telepsychological services, Phillip Roach completed an informed consent document, which included the development of a safety plan (i.e., an emergency contact and emergency resources) in the  event of an emergency/crisis. Phillip Roach expressed understanding of the rationale of the safety plan. Phillip Roach verbally acknowledged understanding he is ultimately responsible for understanding his insurance benefits for telepsychological and in-person services. This provider also reviewed confidentiality, as it relates to telepsychological services, as well as the rationale for telepsychological services (i.e., to reduce exposure risk to COVID-19). Phillip Roach  acknowledged understanding that appointments cannot be recorded without both party consent and he is aware he is responsible for securing confidentiality on his end of the session. Phillip Roach verbally consented to proceed.  Chief Complaint/HPI: Phillip Roach was referred by Dr. Coralie Roach due to other depression, with emotional eating. Per the note for the visit with Dr. Coralie Roach on April 15, 2020, "Pt denies suicidal or homicidal ideations. Pt is recognizing he is capable of recognizing he is about to emotionally eat but is not able to stop from eating." The note for the initial appointment with Dr. Coralie Roach on September 24, 2019 indicated the following: "Phillip Roach's habits were reviewed today and are as follows: His family eats meals together, his desired weight loss is 50 lbs, he started gaining weight after getting married, his heaviest weight ever was 340 pounds, he has significant food cravings issues, he snacks frequently in the evenings, he skips meals frequently, he is frequently drinking liquids with calories, he frequently makes poor food choices, he frequently eats larger portions than normal and he struggles with emotional eating." Phillip Roach's Food and Mood (modified PHQ-9) score on September 24, 2019 was 17.  During today's appointment, Phillip Roach was verbally administered a questionnaire assessing various behaviors related to emotional eating. Rein endorsed the following: overeat when you are celebrating, eat certain foods when you are anxious, stressed,  depressed, or your feelings are hurt, use food to help you cope with emotional situations, find food is comforting to you, overeat  when you are angry or upset, overeat frequently when you are bored or lonely, eat to help you stay awake and eat as a reward. He shared he craves muffins. Phillip Roach believes the onset of emotional eating was during freshman year of college secondary to loneliness and described the current frequency of emotional eating as "at least every time [he is] at work" due to boredom. In addition, Phillip Roach denied a history of binge eating. Phillip Roach denied a history of restricting food intake, purging and engagement in other compensatory strategies for weight loss, and has never been diagnosed with an eating disorder. He also denied a history of treatment for emotional eating. Currently, Phillip Roach indicated boredom and depressed mood triggers emotional eating, whereas talking it out and finding a solution to a problem makes emotional eating better. He shared he often feels guilty when he eats foods he feels he should not. Furthermore, Phillip Roach denied other problems of concern.    Mental Status Examination:  Appearance: well groomed and appropriate hygiene  Behavior: appropriate to circumstances Mood: euthymic Affect: mood congruent Speech: normal in rate, volume, and tone Eye Contact: appropriate Psychomotor Activity: unable to assess  Gait: unable to assess Thought Process: linear, logical, and goal directed  Thought Content/Perception: denies suicidal and homicidal ideation, plan, and intent and no hallucinations, delusions, bizarre thinking or behavior reported or observed Orientation: time, person, place, and purpose of appointment Memory/Concentration: memory, attention, language, and fund of knowledge intact  Insight/Judgment: good  Family & Psychosocial History: Phillip Roach reported he is married and he has two children (ages 31 and 72). 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 Additionally, Kahli shared his highest level of education obtained are two bachelor's degrees. Currently, Phillip Roach's social support system consists of his wife, parents, couple friends, children, fur babies, and mother in Sports coach. Moreover, Phillip Roach stated he resides with his wife, children, fur babies, and parents.   Medical History:  Past Medical History:  Diagnosis Date  . ADHD   . ADHD (attention deficit hyperactivity disorder)   . Asthma   . Back pain   . Depression   . Fatigue   . GERD (gastroesophageal reflux disease)   . Heartburn   . Hypertension   . Joint pain   . Lactose intolerance   . Overweight   . Sleep apnea   . Vitamin D deficiency    Past Surgical History:  Procedure Laterality Date  . FOREHEAD RECONSTRUCTION Right    Right Temple Shrapnel Removed  . PILONIDAL CYST DRAINAGE     Current Outpatient Medications on File Prior to Visit  Medication Sig Dispense Refill  . acetaminophen (TYLENOL) 500 MG tablet Take 1 tablet (500 mg total) by mouth every 6 (six) hours as needed. 30 tablet 0  . b complex vitamins tablet Take 1 tablet by mouth daily.    [name] buPROPion (WELLBUTRIN XL) 300 MG 24 hr tablet Take 1 tablet (300 mg total) by mouth daily. 90 tablet 1  . ibuprofen (ADVIL,MOTRIN) 200 MG tablet Take 200 mg by mouth every 6 (six) hours as needed for pain.    [name] loratadine (CLARITIN) 10 MG tablet Take 10 mg by mouth daily.    . metFORMIN (GLUCOPHAGE) 500 MG tablet Take 1 tablet (500 mg total) by mouth daily with breakfast. 30 tablet 0  . methylphenidate 54 MG PO CR tablet Take 54 mg by mouth every morning.    . triamcinolone (NASACORT) 55 MCG/ACT AERO nasal inhaler Place 2 sprays into the  nose daily.    . Vitamin D, Ergocalciferol, (DRISDOL) 1.25 MG (50000 UNIT) CAPS capsule Take 1 capsule (50,000 Units total) by mouth every 7 (seven) days. 4 capsule 0   No current facility-administered medications on file prior to visit.  Tristyn denied a history of head injuries and loss  of consciousness.    Mental Health History: Keylan reported he is currently attending therapeutic services with Phillip Sartorius, LCSW with [facility] to address symptoms of depression and coping with day to day stressors. He was receptive to signing an authorization for coordination of care if deemed necessary. Jyquan stated he meets with Mr. Phillip Roach every two weeks, but noted it can be longer periods between appointments. He noted their next appointment is February 28th. Additionally, Zayquan stated he was prescribed Wellbutrin by his PCP. Waylin stated the medication helps with motivation, but reported ongoing fatigue. It was recommended he contact his prescribing provider; he agreed. He shared a prior history of therapeutic services to address feeling left out in situations as well as symptoms of ADHD in childhood. He also recalled attending services to address symptoms of anxiety related to marriage. Octavius reported there is no history of hospitalizations for psychiatric concerns. Rylee shared his mother is a "recovering alcoholic," and his father "still drinks." He further shared his parents suffer from depression. Phillip Roach reported there is no history of trauma including psychological, physical  and sexual abuse, as well as neglect.   Anacleto described his typical mood lately as "jovial." Brandom endorsed rare alcohol use. He denied current tobacco use. He denied illicit/recreational substance use. Regarding caffeine intake, Brenda reported consuming at least 32oz of coffee daily. Furthermore, Phillip Roach indicated he is not experiencing the following: hallucinations and delusions, paranoia, symptoms of mania , social withdrawal, crying spells and panic attacks. He also denied current suicidal ideation, plan, and intent; history of and current homicidal ideation, plan, and intent; and history of and current engagement in self-harm.  Phillip Roach endorsed a history of suicidal ideation in "early high school." He  recalled writing down a list of suicidal plans and was "scared" when he read the list. He denied suicidal intent. He noted high school was the first and last time he experienced suicidal ideation. The following protective factors were identified for Phillip Roach: children, wife, and future. If he were to become overwhelmed in the future, which is a sign that a crisis may occur, he identified the following coping skills he could engage in: spending time with family, eating something, and focusing on a project. It was recommended the aforementioned be written down and developed into a coping card for future reference; he agreed. Psychoeducation regarding the importance of reaching out to a trusted individual and/or utilizing emergency resources if there is a change in emotional status and/or there is an inability to ensure safety was provided. Hyun's confidence in reaching out to a trusted individual and/or utilizing emergency resources should there be an intensification in emotional status and/or there is an inability to ensure safety was assessed on a scale of one to ten where one is not confident and ten is extremely confident. He reported his confidence is a 10.   The following strengths were reported by Phillip Roach: humor, creative, and family oriented. The following strengths were observed by this provider: ability to express thoughts and feelings during the therapeutic session, ability to establish and benefit from a therapeutic relationship, willingness to work toward established goal(s) with the clinic and ability to engage in reciprocal conversation.  Legal History: Phillip Roach reported there is no history of legal involvement.   Structured Assessments Results: The Patient Health Questionnaire-9 (PHQ-9) is a self-report measure that assesses symptoms and severity of depression over the course of the last two weeks. Latasha obtained a score of 7 suggesting mild depression. Amoni finds the endorsed symptoms to be somewhat  difficult. [0= Not at all; 1= Several days; 2= More than half the days; 3= Nearly every day] Little interest or pleasure in doing things 0  Feeling down, depressed, or hopeless 0  Trouble falling or staying asleep, or sleeping too much 1  Feeling tired or having little energy 2  Poor appetite or overeating 1  Feeling bad about yourself --- or that you are a failure or have let yourself or your family down 0  Trouble concentrating on things, such as reading the newspaper or watching television 3  Moving or speaking so slowly that other people could have noticed? Or the opposite --- being so fidgety or restless that you have been moving around a lot more than usual 0  Thoughts that you would be better off dead or hurting yourself in some way 0  PHQ-9 Score 7    The Generalized Anxiety Disorder-7 (GAD-7) is a brief self-report measure that assesses symptoms of anxiety over the course of the last two weeks. Bacilio obtained a score of 0. [0= Not at all; 1= Several days; 2= Over half the days; 3= Nearly every day] Feeling nervous, anxious, on edge 0  Not being able to stop or control worrying 0  Worrying too much about different things 0  Trouble relaxing 0  Being so restless that it's hard to sit still 0  Becoming easily annoyed or irritable 0  Feeling afraid as if something awful might happen 0  GAD-7 Score 0   Interventions:  Conducted a chart review Focused on rapport building Verbally administered PHQ-9 and GAD-7 for symptom monitoring Verbally administered Food & Mood questionnaire to assess various behaviors related to emotional eating Provided emphatic reflections and validation Collaborated with patient on a treatment goal  Psychoeducation provided regarding physical versus emotional hunger  Provisional DSM-5 Diagnosis(es): 311 (F32.8) Other Specified Depressive Disorder, Emotional Eating Behaviors  Plan: Ayron appears able and willing to participate as evidenced by collaboration  on a treatment goal, engagement in reciprocal conversation, and asking questions as needed for clarification. The next appointment will be scheduled in two weeks, which will be via MyChart Video Visit. The following treatment goal was established: increase coping skills. This provider will regularly review the treatment plan and medical chart to keep informed of status changes. Itamar expressed understanding and agreement with the initial treatment plan of care. Darrius will be sent a handout via e-mail to utilize between now and the next appointment to increase awareness of hunger patterns and subsequent eating. Phillip Roach provided verbal consent during today's appointment for this provider to send the handout via e-mail.

## 2020-04-25 ENCOUNTER — Other Ambulatory Visit (INDEPENDENT_AMBULATORY_CARE_PROVIDER_SITE_OTHER): Payer: Self-pay | Admitting: Family Medicine

## 2020-04-25 DIAGNOSIS — R7303 Prediabetes: Secondary | ICD-10-CM

## 2020-04-28 ENCOUNTER — Telehealth (INDEPENDENT_AMBULATORY_CARE_PROVIDER_SITE_OTHER): Payer: Self-pay

## 2020-04-28 NOTE — Telephone Encounter (Signed)
Mychart message responded to that was sent by pt-CAS

## 2020-04-28 NOTE — Telephone Encounter (Signed)
Pt called for a refill on his metformin, please give pt a call back at [phone]. Pt uses [facility] [place]

## 2020-04-28 NOTE — Telephone Encounter (Signed)
Last seen by Dr. Ukleja. 

## 2020-04-28 NOTE — Telephone Encounter (Signed)
Dr.Ukleja 

## 2020-04-29 ENCOUNTER — Telehealth (INDEPENDENT_AMBULATORY_CARE_PROVIDER_SITE_OTHER): Payer: 59 | Admitting: Psychology

## 2020-04-29 DIAGNOSIS — F3289 Other specified depressive episodes: Secondary | ICD-10-CM | POA: Diagnosis not present

## 2020-04-29 NOTE — Progress Notes (Signed)
  Office: [phone]  /  Fax: [phone]    Date: May 13, 2020   Appointment Start Time: 8:30am Duration: 31 minutes Provider: Glennie Isle, Psy.D. Type of Session: Individual Therapy  Location of Patient: Home Location of Provider: Provider's Home (private office) Type of Contact: Telepsychological Visit via MyChart Video Visit  Session Content: Phillip Roach is a 40 y.o. male presenting for a follow-up appointment to address the previously established treatment goal of increasing coping skills. Today's appointment was a telepsychological visit due to COVID-19. Phillip Roach provided verbal consent for today's telepsychological appointment and he is aware he is responsible for securing confidentiality on his end of the session. Prior to proceeding with today's appointment, Phillip Roach's physical location at the time of this appointment was obtained as well a phone number he could be reached at in the event of technical difficulties. Phillip Roach and this provider participated in today's telepsychological service.   This provider conducted a brief check-in. Phillip Roach stated he started Ozempic and discussed ongoing stressors, including marital stress. Phillip Roach indicated he and his wife are scheduled for a couple's therapy appointment tomorrow. Additionally, he sated he continues to attend individual therapy with Mr. Archie Balboa. Moreover, he indicated he engaged in some emotional eating, adding he has been eating the food on his meal plan. Phillip Roach described foods that are easily accessible can be more tempting. Thus, this provider and Phillip Roach discussed pre-planning/portioning snacks for easy access. Phillip Roach agreed. Moreover, further psychoeducation regarding emotional versus physical hunger was provided, as Phillip Roach acknowledged he did not have the opportunity to look at the previously shared handout. The handout was re-sent via e-mail with Phillip Roach's verbal consent. This provider also discussed using the differences from the handout to assist  with self-talk. Furthermore, psychoeducation regarding all or nothing thinking was provided. Overall, Phillip Roach was receptive to today's appointment as evidenced by openness to sharing, responsiveness to feedback, and willingness to discuss hunger patterns.  Mental Status Examination:  Appearance: well groomed and appropriate hygiene  Behavior: appropriate to circumstances Mood: "tired" Affect: mood congruent Speech: normal in rate, volume, and tone Eye Contact: appropriate Psychomotor Activity: unable to assess  Gait: unable to assess Thought Process: linear, logical, and goal directed  Thought Content/Perception: no hallucinations, delusions, bizarre thinking or behavior reported or observed and denies suicidal and homicidal ideation, plan, and intent Orientation: time, person, place, and purpose of appointment Memory/Concentration: memory, attention, language, and fund of knowledge intact  Insight/Judgment: fair  Interventions:  Conducted a brief chart review Provided empathic reflections and validation Employed supportive psychotherapy interventions to facilitate reduced distress and to improve coping skills with identified stressors Engaged patient in problem solving Psychoeducation provided regarding physical versus emotional hunger Psychoeducation provided regarding all-or-nothing thinking  DSM-5 Diagnosis(es): 311 (F32.8) Other Specified Depressive Disorder, Emotional Eating Behaviors  Treatment Goal & Progress: During the initial appointment with this provider, the following treatment goal was established: increase coping skills. Progress is limited, as Phillip Roach has just begun treatment with this provider; however, he is receptive to the interaction and interventions and rapport is being established.   Plan: The next appointment will be scheduled in two weeks, which will be via MyChart Video Visit. The next session will focus on working towards the established treatment goal.

## 2020-05-01 ENCOUNTER — Encounter (INDEPENDENT_AMBULATORY_CARE_PROVIDER_SITE_OTHER): Payer: Self-pay

## 2020-05-01 ENCOUNTER — Other Ambulatory Visit (INDEPENDENT_AMBULATORY_CARE_PROVIDER_SITE_OTHER): Payer: Self-pay | Admitting: Family Medicine

## 2020-05-01 ENCOUNTER — Encounter (INDEPENDENT_AMBULATORY_CARE_PROVIDER_SITE_OTHER): Payer: Self-pay | Admitting: Family Medicine

## 2020-05-01 ENCOUNTER — Other Ambulatory Visit: Payer: Self-pay

## 2020-05-01 ENCOUNTER — Ambulatory Visit (INDEPENDENT_AMBULATORY_CARE_PROVIDER_SITE_OTHER): Payer: 59 | Admitting: Family Medicine

## 2020-05-01 VITALS — BP 122/81 | HR 77 | Temp 97.9°F | Ht 74.0 in | Wt 316.0 lb

## 2020-05-01 DIAGNOSIS — Z6841 Body Mass Index (BMI) 40.0 and over, adult: Secondary | ICD-10-CM | POA: Diagnosis not present

## 2020-05-01 DIAGNOSIS — R7303 Prediabetes: Secondary | ICD-10-CM

## 2020-05-01 DIAGNOSIS — E7849 Other hyperlipidemia: Secondary | ICD-10-CM | POA: Diagnosis not present

## 2020-05-01 DIAGNOSIS — Z9189 Other specified personal risk factors, not elsewhere classified: Secondary | ICD-10-CM

## 2020-05-01 MED ORDER — METFORMIN HCL 500 MG PO TABS: 500.0000 mg | ORAL_TABLET | Freq: Every day | ORAL | 0 refills | Status: DC

## 2020-05-01 MED ORDER — OZEMPIC (0.25 OR 0.5 MG/DOSE) 2 MG/1.5ML ~~LOC~~ SOPN: 0.2500 mg | PEN_INJECTOR | SUBCUTANEOUS | 0 refills | Status: DC

## 2020-05-01 MED FILL — METFORMIN HCL 500 MG TABS: 500 | 30 days supply | Qty: 30 | Fill #0

## 2020-05-01 MED FILL — OZEMPIC 0.25 OR 0.5 MG/DOSE: 2 | 28 days supply | Qty: 2 | Fill #0

## 2020-05-05 ENCOUNTER — Encounter (INDEPENDENT_AMBULATORY_CARE_PROVIDER_SITE_OTHER): Payer: Self-pay

## 2020-05-05 DIAGNOSIS — F329 Major depressive disorder, single episode, unspecified: Secondary | ICD-10-CM | POA: Diagnosis not present

## 2020-05-05 NOTE — Progress Notes (Signed)
Chief Complaint:   OBESITY Phillip Roach is here to discuss his progress with his obesity treatment plan along with follow-up of his obesity related diagnoses. [name] is on the Category 4 Plan and states he is following his eating plan approximately 70% of the time. Callahan states he is building a playground 3-4 minutes 4 times per week.  Today's visit was #: 14 Starting weight: 325 lbs Starting date: 09/24/2019 Today's weight: 316 lbs Today's date: 05/01/2020 Total lbs lost to date: 9 lbs Total lbs lost since last in-office visit: 4 lbs  Interim History: Phillip Roach feels he has been eating more over the last few weeks. He has been trying to make choices more in line with category 4. He may be interested in pursuing weight loss medications. He has no plans for the next few weeks in terms of obstacles.  Subjective:   1. Pre-diabetes Phillip Roach's A1c is 5.7 with an insulin level of 9.7. He is on metformin currently but pt is interested in GLP-1.  Lab Results  Component Value Date   HGBA1C 5.7 (H) 02/25/2020   Lab Results  Component Value Date   INSULIN 9.7 02/25/2020   INSULIN 17.3 09/24/2019    2. Other hyperlipidemia Phillip Roach's LDL 116, HDL 33, and triglycerides 159. He is not on statin therapy. Cannot risk stratify due to age.  Lab Results  Component Value Date   ALT 34 07/16/2019   AST 19 07/16/2019   ALKPHOS 54 07/16/2019   BILITOT 0.8 07/16/2019   Lab Results  Component Value Date   CHOL 177 02/25/2020   HDL 33 (L) 02/25/2020   LDLCALC 116 (H) 02/25/2020   LDLDIRECT 85.4 07/28/2007   TRIG 159 (H) 02/25/2020   CHOLHDL 5 06/27/2012    3. At risk for adverse drug reaction Brinden is at risk for drug side effects due to starting GLP-1 medication.  Assessment/Plan:   1. Pre-diabetes [name] will continue to work on weight loss, exercise, and decreasing simple carbohydrates to help decrease the risk of diabetes. Start Ozempic 0.25 mg, as per below. Refill Metformin, as per below.  -  Semaglutide,0.25 or 0.5MG /DOS, (OZEMPIC, 0.25 OR 0.5 MG/DOSE,) 2 MG/1.5ML SOPN; Inject 0.25 mg into the skin once a week.  Dispense: 1.5 mL; Refill: 0 - metFORMIN (GLUCOPHAGE) 500 MG tablet; Take 1 tablet (500 mg total) by mouth daily with breakfast.  Dispense: 30 tablet; Refill: 0  2. Other hyperlipidemia Cardiovascular risk and specific lipid/LDL goals reviewed.  We discussed several lifestyle modifications today and Weldon will continue to work on diet, exercise and weight loss efforts. Orders and follow up as documented in patient record. Repeat labs in April/May 2022.  Counseling Intensive lifestyle modifications are the first line treatment for this issue. . Dietary changes: Increase soluble fiber. Decrease simple carbohydrates. . Exercise changes: Moderate to vigorous-intensity aerobic activity 150 minutes per week if tolerated. . Lipid-lowering medications: see documented in medical record.  3. At risk for adverse drug reaction Phillip Roach was given approximately 15 minutes of drug side effect counseling today.  We discussed side effect possibility and risk versus benefits. Phillip Roach agreed to the medication and will contact this office if these side effects are intolerable.  Repetitive spaced learning was employed today to elicit superior memory formation and behavioral change.  4. Class 3 severe obesity with serious comorbidity and body mass index (BMI) of 40.0 to 44.9 in adult, unspecified obesity type [facility]) Phillip Roach is currently in the action stage of change. As such, his goal is  to continue with weight loss efforts. He has agreed to the Category 4 Plan.   Exercise goals: All adults should avoid inactivity. Some physical activity is better than none, and adults who participate in any amount of physical activity gain some health benefits.  Behavioral modification strategies: increasing lean protein intake, meal planning and cooking strategies, keeping healthy foods in the home and planning for  success.  [name] has agreed to follow-up with our clinic in 2 weeks. He was informed of the importance of frequent follow-up visits to maximize his success with intensive lifestyle modifications for his multiple health conditions.   Objective:   Blood pressure 122/81, pulse 77, temperature 97.9 F (36.6 C), temperature source Oral, height 6\' 2"  (1.88 m), weight (!) 316 lb (143.3 kg), SpO2 97 %. Body mass index is 40.57 kg/m.  General: Cooperative, alert, well developed, in no acute distress. HEENT: Conjunctivae and lids unremarkable. Cardiovascular: Regular rhythm.  Lungs: Normal work of breathing. Neurologic: No focal deficits.   Lab Results  Component Value Date   CREATININE 0.82 07/16/2019   BUN 8 07/16/2019   NA 139 07/16/2019   K 4.2 07/16/2019   CL 106 07/16/2019   CO2 23 07/16/2019   Lab Results  Component Value Date   ALT 34 07/16/2019   AST 19 07/16/2019   ALKPHOS 54 07/16/2019   BILITOT 0.8 07/16/2019   Lab Results  Component Value Date   HGBA1C 5.7 (H) 02/25/2020   HGBA1C 5.7 (H) 09/24/2019   Lab Results  Component Value Date   INSULIN 9.7 02/25/2020   INSULIN 17.3 09/24/2019   Lab Results  Component Value Date   TSH 0.775 07/16/2019   Lab Results  Component Value Date   CHOL 177 02/25/2020   HDL 33 (L) 02/25/2020   LDLCALC 116 (H) 02/25/2020   LDLDIRECT 85.4 07/28/2007   TRIG 159 (H) 02/25/2020   CHOLHDL 5 06/27/2012   Lab Results  Component Value Date   WBC 8.0 07/16/2019   HGB 15.6 07/16/2019   HCT 45.6 07/16/2019   MCV 88.0 07/16/2019   PLT 243 07/16/2019   Attestation Statements:   Reviewed by clinician on day of visit: allergies, medications, problem list, medical history, surgical history, family history, social history, and previous encounter notes.  Coral Ceo, am acting as transcriptionist for Coralie Common, MD.   I have reviewed the above documentation for accuracy and completeness, and I agree with the above. -  Jinny Blossom, MD

## 2020-05-13 ENCOUNTER — Telehealth (INDEPENDENT_AMBULATORY_CARE_PROVIDER_SITE_OTHER): Payer: 59 | Admitting: Psychology

## 2020-05-13 DIAGNOSIS — F3289 Other specified depressive episodes: Secondary | ICD-10-CM | POA: Diagnosis not present

## 2020-05-13 NOTE — Progress Notes (Signed)
  Office: [phone]  /  Fax: [phone]    Date: May 27, 2020   Appointment Start Time: 10:00am Duration: 25 minutes Provider: Glennie Isle, Psy.D. Type of Session: Individual Therapy  Location of Patient: Home Location of Provider: Provider's Home (private office) Type of Contact: Telepsychological Visit via MyChart Video Visit  Session Content: Phillip Roach is a 40 y.o. male presenting for a follow-up appointment to address the previously established treatment goal of increasing coping skills. Today's appointment was a telepsychological visit due to COVID-19. Phillip Roach provided verbal consent for today's telepsychological appointment and he is aware he is responsible for securing confidentiality on his end of the session. Prior to proceeding with today's appointment, Phillip Roach's physical location at the time of this appointment was obtained as well a phone number he could be reached at in the event of technical difficulties. Phillip Roach and this provider participated in today's telepsychological service.   This provider conducted a brief check-in. Phillip Roach reported he continues to work on the playground he is making for his children. He also discussed being mindful of hunger cues to help reduce emotional eating and increase eating congruent to his meal plan. Positive reinforcement was provided. Psychoeducation regarding triggers for emotional eating was provided. Phillip Roach was provided a handout, and encouraged to utilize the handout between now and the next appointment to increase awareness of triggers and frequency. Phillip Roach agreed. This provider also discussed behavioral strategies for specific triggers, such as placing the utensil down when conversing to avoid mindless eating. Phillip Roach provided verbal consent during today's appointment for this provider to send a handout about triggers via e-mail. Moreover, psychoeducation regarding all or nothing thinking was provided. Phillip Roach was receptive to today's appointment as  evidenced by openness to sharing, responsiveness to feedback, and willingness to explore triggers for emotional eating.  Mental Status Examination:  Appearance: well groomed and appropriate hygiene  Behavior: appropriate to circumstances Mood: euthymic; "tired" Affect: mood congruent Speech: normal in rate, volume, and tone Eye Contact: appropriate Psychomotor Activity: unable to assess  Gait: unable to assess Thought Process: linear, logical, and goal directed  Thought Content/Perception: no hallucinations, delusions, bizarre thinking or behavior reported or observed and no evidence of suicidal and homicidal ideation, plan, and intent Orientation: time, person, place, and purpose of appointment Memory/Concentration: memory, attention, language, and fund of knowledge intact  Insight/Judgment: good  Interventions:  Conducted a brief chart review Provided empathic reflections and validation Reviewed content from the previous session Provided positive reinforcement Employed supportive psychotherapy interventions to facilitate reduced distress and to improve coping skills with identified stressors Psychoeducation provided regarding triggers for emotional eating Psychoeducation provided regarding all-or-nothing thinking  DSM-5 Diagnosis(es): 311 (F32.8) Other Specified Depressive Disorder, Emotional Eating Behaviors  Treatment Goal & Progress: During the initial appointment with this provider, the following treatment goal was established: increase coping skills. Phillip Roach has demonstrated progress in his goal as evidenced by increased awareness of hunger patterns.   Plan: The next appointment will be scheduled in approximately two weeks, which will be via MyChart Video Visit. The next session will focus on working towards the established treatment goal.

## 2020-05-14 ENCOUNTER — Other Ambulatory Visit: Payer: Self-pay

## 2020-05-15 ENCOUNTER — Encounter: Payer: Self-pay | Admitting: Family Medicine

## 2020-05-15 ENCOUNTER — Ambulatory Visit: Payer: 59 | Admitting: Family Medicine

## 2020-05-15 VITALS — BP 126/70 | HR 88 | Temp 97.1°F | Ht 74.0 in | Wt 320.0 lb

## 2020-05-15 DIAGNOSIS — E559 Vitamin D deficiency, unspecified: Secondary | ICD-10-CM | POA: Diagnosis not present

## 2020-05-15 DIAGNOSIS — R5383 Other fatigue: Secondary | ICD-10-CM

## 2020-05-15 DIAGNOSIS — E291 Testicular hypofunction: Secondary | ICD-10-CM

## 2020-05-15 DIAGNOSIS — Z6841 Body Mass Index (BMI) 40.0 and over, adult: Secondary | ICD-10-CM

## 2020-05-15 DIAGNOSIS — F3341 Major depressive disorder, recurrent, in partial remission: Secondary | ICD-10-CM

## 2020-05-15 DIAGNOSIS — Z3009 Encounter for other general counseling and advice on contraception: Secondary | ICD-10-CM

## 2020-05-15 LAB — CBC
HCT: 44.9 % (ref 39.0–52.0)
Hemoglobin: 15.5 g/dL (ref 13.0–17.0)
MCHC: 34.6 g/dL (ref 30.0–36.0)
MCV: 86.7 fl (ref 78.0–100.0)
Platelets: 230 10*3/uL (ref 150.0–400.0)
RBC: 5.18 Mil/uL (ref 4.22–5.81)
RDW: 12.6 % (ref 11.5–15.5)
WBC: 8.1 10*3/uL (ref 4.0–10.5)

## 2020-05-15 LAB — COMPREHENSIVE METABOLIC PANEL
ALT: 23 U/L (ref 0–53)
AST: 14 U/L (ref 0–37)
Albumin: 4.5 g/dL (ref 3.5–5.2)
Alkaline Phosphatase: 50 U/L (ref 39–117)
BUN: 15 mg/dL (ref 6–23)
CO2: 30 mEq/L (ref 19–32)
Calcium: 9.5 mg/dL (ref 8.4–10.5)
Chloride: 103 mEq/L (ref 96–112)
Creatinine, Ser: 0.89 mg/dL (ref 0.40–1.50)
GFR: 108.49 mL/min (ref >60.00)
Glucose, Bld: 83 mg/dL (ref 70–99)
Potassium: 4.2 mEq/L (ref 3.5–5.1)
Sodium: 139 mEq/L (ref 135–145)
Total Bilirubin: 0.5 mg/dL (ref 0.2–1.2)
Total Protein: 7.2 g/dL (ref 6.0–8.3)

## 2020-05-15 LAB — TESTOSTERONE: Testosterone: 160.48 ng/dL — ABNORMAL LOW (ref 300.00–890.00)

## 2020-05-15 NOTE — Progress Notes (Addendum)
Established Patient Office Visit  Subjective:  Patient ID: Phillip Roach, male    DOB: [birth year]  Age: 40 y.o. MRN: [id number]  CC:  Chief Complaint  Patient presents with   Follow-up    6 month follow up, patient states that Wellbutrin seems to be working well but he feels like his energy level is down.     HPI Phillip Roach presents for follow-up of depression, obesity, vitamin D deficiency, fatigue and desires a vasectomy.  Continues to see his counselor Archie Balboa for talking therapy.  The Wellbutrin has helped a great deal running would like to continue it.  Last vitamin D check was into the normal range.  Has not been taking his high-dose weekly pill.  Continues to see weight loss management.  Hemoglobin A1c was measured at 5.9.  He was started on semaglutide and has been taking this for the last couple of weeks.  Thinks it may be helping some with weight loss.  Continues to feel a good deal of fatigue.  Thyroid functions have been normal recently.  He has not exercising at home.  His work does involve some walking.  He has been able to lose some weight he tells me.  Past Medical History:  Diagnosis Date   ADHD    ADHD (attention deficit hyperactivity disorder)    Asthma    Back pain    Depression    Fatigue    GERD (gastroesophageal reflux disease)    Heartburn    Hypertension    Joint pain    Lactose intolerance    Overweight    Sleep apnea    Vitamin D deficiency     Past Surgical History:  Procedure Laterality Date   FOREHEAD RECONSTRUCTION Right    Right Temple Shrapnel Removed   PILONIDAL CYST DRAINAGE      Family History  Problem Relation Age of Onset   GER disease Mother    Depression Mother    Alcohol abuse Mother    Obesity Mother    Anxiety disorder Father    Hypertension Father    Sleep apnea Father    Obesity Father     Social History   Socioeconomic History   Marital status: Married    Spouse name: Not on  file   Number of children: Not on file   Years of education: Not on file   Highest education level: Not on file  Occupational History   Occupation: Paramedic  Tobacco Use   Smoking status: Never Smoker   Smokeless tobacco: Never Used  Substance and Sexual Activity   Alcohol use: Yes    Comment: couple times a month   Drug use: No   Sexual activity: Not on file  Other Topics Concern   Not on file  Social History Narrative   Married and has baby on the way    Works for [employer] to [employer] school   Social Determinants of Radio broadcast assistant Strain: Not on file  Food Insecurity: Not on file  Transportation Needs: Not on file  Physical Activity: Not on file  Stress: Not on file  Social Connections: Not on file  Intimate Partner Violence: Not on file    Outpatient Medications Prior to Visit  Medication Sig Dispense Refill   acetaminophen (TYLENOL) 500 MG tablet Take 1 tablet (500 mg total) by mouth every 6 (six) hours as needed. (Patient taking differently: Take 1,000 mg by mouth every 6 (six) hours  as needed for moderate pain or headache.) 30 tablet 0   b complex vitamins tablet Take 2 tablets by mouth daily.     buPROPion (WELLBUTRIN XL) 300 MG 24 hr tablet Take 1 tablet (300 mg total) by mouth daily. 90 tablet 1   ibuprofen (ADVIL,MOTRIN) 200 MG tablet Take 800 mg by mouth every 6 (six) hours as needed for pain.     loratadine (CLARITIN) 10 MG tablet Take 10 mg by mouth daily.     triamcinolone (NASACORT) 55 MCG/ACT AERO nasal inhaler Place 1 spray into the nose daily.     Semaglutide,0.25 or 0.5MG /DOS, (OZEMPIC, 0.25 OR 0.5 MG/DOSE,) 2 MG/1.5ML SOPN Inject 0.25 mg into the skin once a week. 1.5 mL 0   Vitamin D, Ergocalciferol, (DRISDOL) 1.25 MG (50000 UNIT) CAPS capsule Take 1 capsule (50,000 Units total) by mouth every 7 (seven) days. 4 capsule 0   metFORMIN (GLUCOPHAGE) 500 MG tablet Take 1 tablet (500 mg total) by mouth daily with breakfast.  (Patient not taking: Reported on 06/06/2020) 30 tablet 0   methylphenidate 54 MG PO CR tablet Take 54 mg by mouth every morning.     No facility-administered medications prior to visit.    Allergies  Allergen Reactions   Shellfish Allergy Anaphylaxis    Iodine ok per pt   Keflex [Cephalexin] Rash    ROS Review of Systems  Constitutional: Positive for fatigue.  HENT: Negative.   Eyes: Negative for photophobia and visual disturbance.  Respiratory: Negative.   Cardiovascular: Negative.   Endocrine: Negative for polyphagia and polyuria.  Genitourinary: Negative.   Musculoskeletal: Negative for gait problem and joint swelling.  Skin: Negative.   Allergic/Immunologic: Negative for immunocompromised state.  Neurological: Negative for speech difficulty and weakness.  Hematological: Does not bruise/bleed easily.  Psychiatric/Behavioral: Negative.       Objective:    Physical Exam Vitals and nursing note reviewed.  Constitutional:      General: He is not in acute distress.    Appearance: Normal appearance. He is obese. He is not ill-appearing, toxic-appearing or diaphoretic.  HENT:     Head: Normocephalic and atraumatic.     Right Ear: Tympanic membrane, ear canal and external ear normal.     Left Ear: Tympanic membrane, ear canal and external ear normal.     Mouth/Throat:     Mouth: Mucous membranes are dry.  Cardiovascular:     Rate and Rhythm: Normal rate and regular rhythm.  Pulmonary:     Effort: Pulmonary effort is normal.     Breath sounds: Normal breath sounds.  Abdominal:     General: Bowel sounds are normal.  Musculoskeletal:     Cervical back: No rigidity or tenderness.  Lymphadenopathy:     Cervical: No cervical adenopathy.  Skin:    General: Skin is warm and dry.  Neurological:     Mental Status: He is alert and oriented to person, place, and time.  Psychiatric:        Mood and Affect: Mood normal.        Behavior: Behavior normal.     BP 126/70     Pulse 88    Temp (!) 97.1 F (36.2 C) (Temporal)    Ht 6\' 2"  (1.88 m)    Wt (!) 320 lb (145.2 kg)    SpO2 96%    BMI 41.09 kg/m  Wt Readings from Last 3 Encounters:  06/09/20 (!) 311 lb (141.1 kg)  05/20/20 (!) 315 lb (142.9 kg)  05/15/20 (!) 320 lb (145.2 kg)     Health Maintenance Due  Topic Date Due   Hepatitis C Screening  Never done   HIV Screening  Never done    There are no preventive care reminders to display for this patient.  Lab Results  Component Value Date   TSH 0.775 07/16/2019   Lab Results  Component Value Date   WBC 8.1 05/15/2020   HGB 15.5 05/15/2020   HCT 44.9 05/15/2020   MCV 86.7 05/15/2020   PLT 230.0 05/15/2020   Lab Results  Component Value Date   NA 139 05/15/2020   K 4.2 05/15/2020   CO2 30 05/15/2020   GLUCOSE 83 05/15/2020   BUN 15 05/15/2020   CREATININE 0.89 05/15/2020   BILITOT 0.5 05/15/2020   ALKPHOS 50 05/15/2020   AST 14 05/15/2020   ALT 23 05/15/2020   PROT 7.2 05/15/2020   ALBUMIN 4.5 05/15/2020   CALCIUM 9.5 05/15/2020   ANIONGAP 10 07/16/2019   GFR 108.49 05/15/2020   Lab Results  Component Value Date   CHOL 177 02/25/2020   Lab Results  Component Value Date   HDL 33 (L) 02/25/2020   Lab Results  Component Value Date   LDLCALC 116 (H) 02/25/2020   Lab Results  Component Value Date   TRIG 159 (H) 02/25/2020   Lab Results  Component Value Date   CHOLHDL 5 06/27/2012   Lab Results  Component Value Date   HGBA1C 5.7 (H) 02/25/2020      Assessment & Plan:   Problem List Items Addressed This Visit      Endocrine   Androgen deficiency   Relevant Orders   Testosterone   Testosterone Total,Free,Bio, Males-([facility])     Other   Vasectomy evaluation   Relevant Orders   Ambulatory referral to Urology   Recurrent major depressive disorder, in partial remission ([place])   Vitamin D deficiency   Class 3 severe obesity with serious comorbidity and body mass index (BMI) of 40.0 to 44.9 in adult [facility])    Other fatigue - Primary   Relevant Orders   CBC (Completed)   Comprehensive metabolic panel (Completed)   Testosterone (Completed)      No orders of the defined types were placed in this encounter.   Follow-up: Return in about 6 months (around 11/15/2020).  Suggested he try to achieve 10,000 steps daily between home life and work.  Suggested he try 5000 international units of D3 daily in lieu of the high-dose weekly pill.  Checking serum testosterone.  Weight loss management.  [name], MD

## 2020-05-20 ENCOUNTER — Encounter (INDEPENDENT_AMBULATORY_CARE_PROVIDER_SITE_OTHER): Payer: Self-pay | Admitting: Adult Health

## 2020-05-20 ENCOUNTER — Ambulatory Visit (INDEPENDENT_AMBULATORY_CARE_PROVIDER_SITE_OTHER): Payer: 59 | Admitting: Adult Health

## 2020-05-20 ENCOUNTER — Other Ambulatory Visit (INDEPENDENT_AMBULATORY_CARE_PROVIDER_SITE_OTHER): Payer: Self-pay | Admitting: Adult Health

## 2020-05-20 ENCOUNTER — Other Ambulatory Visit: Payer: Self-pay

## 2020-05-20 VITALS — BP 126/73 | HR 94 | Temp 97.5°F | Ht 74.0 in | Wt 315.0 lb

## 2020-05-20 DIAGNOSIS — R7303 Prediabetes: Secondary | ICD-10-CM | POA: Diagnosis not present

## 2020-05-20 DIAGNOSIS — E559 Vitamin D deficiency, unspecified: Secondary | ICD-10-CM | POA: Diagnosis not present

## 2020-05-20 DIAGNOSIS — Z9189 Other specified personal risk factors, not elsewhere classified: Secondary | ICD-10-CM

## 2020-05-20 DIAGNOSIS — Z6841 Body Mass Index (BMI) 40.0 and over, adult: Secondary | ICD-10-CM

## 2020-05-20 MED ORDER — VITAMIN D (ERGOCALCIFEROL) 1.25 MG (50000 UNIT) PO CAPS: 50000.0000 [IU] | ORAL_CAPSULE | ORAL | 0 refills | Status: DC

## 2020-05-21 NOTE — Progress Notes (Signed)
Chief Complaint:   OBESITY Phillip Roach is here to discuss his progress with his obesity treatment plan along with follow-up of his obesity related diagnoses. Phillip Roach is on the Category 4 Plan and states he is following his eating plan approximately 80% of the time. Phillip Roach states he is doing 0 minutes 0 times per week.  Today's visit was #: 15 Starting weight: 325 lbs Starting date: 09/24/2019 Today's weight: 315 lbs Today's date: 05/20/2020 Total lbs lost to date: 10 lbs Total lbs lost since last in-office visit: 1 lb  Interim History: Phillip Roach was started on Ozempic 0.25 mg once a week. He has had 3 doses so far. He has been off Metformin for 2 weeks. He reports a decrease in overall hunger level since starting GLP-1 injections.   Subjective:   1. Vitamin D deficiency Adrin's Vitamin D level was 37.6 on 02/25/2020, which is below goal of 50. He ran out of prescription Vit D for 3 weeks. He used OTC Vit D3 1,000 IU- 2-3 caps daily in the interim.    Ref. Range 02/25/2020 09:54  Vitamin D, 25-Hydroxy Latest Ref Range: 30.0 - 100.0 ng/mL 37.6   2. Pre-diabetes 02/25/2020 A1c 5.7 with slightly elevated insulin level of 9.7. Phillip Roach was started on Ozempic 0.25 mg on 05/01/2020. He has had 3 doses so far. His last dose of Metformin 500 mg was about 2 weeks ago. He feels that GLP-1 helps decrease hunger levels.   Lab Results  Component Value Date   HGBA1C 5.7 (H) 02/25/2020   Lab Results  Component Value Date   INSULIN 9.7 02/25/2020   INSULIN 17.3 09/24/2019    3. At risk for nausea Phillip Roach is at risk for nausea due to recently starting GLP-1 for pre-diabetes.  Assessment/Plan:   1. Vitamin D deficiency Low Vitamin D level contributes to fatigue and are associated with obesity, breast, and colon cancer. He agrees to continue to take prescription Vitamin D @50 ,000 IU every week and will follow-up for routine testing of Vitamin D, at least 2-3 times per year to avoid over-replacement. Stop  OTC Vit D3 while on prescription Vit D.  - Vitamin D, Ergocalciferol, (DRISDOL) 1.25 MG (50000 UNIT) CAPS capsule; Take 1 capsule (50,000 Units total) by mouth every 7 (seven) days.  Dispense: 4 capsule; Refill: 0  2. Pre-diabetes Rainey will continue to work on weight loss, exercise, and decreasing simple carbohydrates to help decrease the risk of diabetes. Continue Ozempic 0.25 mg once weekly. Remain off Metformin.  3. At risk for nausea RAMIL EDGINGTON was given approximately 15 minutes of nausea prevention counseling today. Phillip Roach is at risk for nausea due to his new or current medication. He was encouraged to titrate his medication slowly, make sure to stay hydrated, eat smaller portions throughout the day, and avoid high fat meals.   4. Class 3 severe obesity with serious comorbidity and body mass index (BMI) of 40.0 to 44.9 in adult, unspecified obesity type [facility]) Phillip Roach is currently in the action stage of change. As such, his goal is to continue with weight loss efforts. He has agreed to the Category 4 Plan.   Remain off Metformin  Exercise goals: No exercise has been prescribed at this time.  Behavioral modification strategies: increasing lean protein intake, meal planning and cooking strategies and planning for success.  Robertt has agreed to follow-up with our clinic in 2 weeks. He was informed of the importance of frequent follow-up visits to maximize his success with intensive  lifestyle modifications for his multiple health conditions.   Objective:   Blood pressure 126/73, pulse 94, temperature (!) 97.5 F (36.4 C), height 6\' 2"  (1.88 m), weight (!) 315 lb (142.9 kg), SpO2 96 %. Body mass index is 40.44 kg/m.  General: Cooperative, alert, well developed, in no acute distress. HEENT: Conjunctivae and lids unremarkable. Cardiovascular: Regular rhythm.  Lungs: Normal work of breathing. Neurologic: No focal deficits.   Lab Results  Component Value Date   CREATININE 0.89  05/15/2020   BUN 15 05/15/2020   NA 139 05/15/2020   K 4.2 05/15/2020   CL 103 05/15/2020   CO2 30 05/15/2020   Lab Results  Component Value Date   ALT 23 05/15/2020   AST 14 05/15/2020   ALKPHOS 50 05/15/2020   BILITOT 0.5 05/15/2020   Lab Results  Component Value Date   HGBA1C 5.7 (H) 02/25/2020   HGBA1C 5.7 (H) 09/24/2019   Lab Results  Component Value Date   INSULIN 9.7 02/25/2020   INSULIN 17.3 09/24/2019   Lab Results  Component Value Date   TSH 0.775 07/16/2019   Lab Results  Component Value Date   CHOL 177 02/25/2020   HDL 33 (L) 02/25/2020   LDLCALC 116 (H) 02/25/2020   LDLDIRECT 85.4 07/28/2007   TRIG 159 (H) 02/25/2020   CHOLHDL 5 06/27/2012   Lab Results  Component Value Date   WBC 8.1 05/15/2020   HGB 15.5 05/15/2020   HCT 44.9 05/15/2020   MCV 86.7 05/15/2020   PLT 230.0 05/15/2020   No results found for: IRON, TIBC, FERRITIN  Attestation Statements:   Reviewed by clinician on day of visit: allergies, medications, problem list, medical history, surgical history, family history, social history, and previous encounter notes.  Coral Ceo, am acting as Location manager for Mina Marble, NP.  I have reviewed the above documentation for accuracy and completeness, and I agree with the above. -  Sergio Zawislak d. Roselyn Doby, NP-C

## 2020-05-26 ENCOUNTER — Ambulatory Visit (INDEPENDENT_AMBULATORY_CARE_PROVIDER_SITE_OTHER): Payer: 59 | Admitting: Family Medicine

## 2020-05-27 ENCOUNTER — Telehealth (INDEPENDENT_AMBULATORY_CARE_PROVIDER_SITE_OTHER): Payer: 59 | Admitting: Psychology

## 2020-05-27 DIAGNOSIS — F3289 Other specified depressive episodes: Secondary | ICD-10-CM

## 2020-05-28 NOTE — Progress Notes (Signed)
  Office: [phone]  /  Fax: [phone]    Date: June 11, 2020   Appointment Start Time: 8:32am Duration: 31 minutes Provider: Glennie Isle, Psy.D. Type of Session: Individual Therapy  Location of Patient: Home Location of Provider: Provider's Home (private office) Type of Contact: Telepsychological Visit via MyChart Video Visit  Session Content: Phillip Roach is a 40 y.o. male presenting for a follow-up appointment to address the previously established treatment goal of increasing coping skills. Today's appointment was a telepsychological visit due to COVID-19. Phillip Roach provided verbal consent for today's telepsychological appointment and he is aware he is responsible for securing confidentiality on his end of the session. Prior to proceeding with today's appointment, Phillip Roach's physical location at the time of this appointment was obtained as well a phone number he could be reached at in the event of technical difficulties. Phillip Roach and this provider participated in today's telepsychological service.   This provider conducted a brief check-in. Phillip Roach reported nothing has changed since the last appointment. He dicussed feeling bored with his structured meal plan; therefore, he was encouraged to discuss this further with Dr. Jearld Shines at their next appointment. Notably, he discussed a reduction in emotional eating and he is making better choices. He also reported when his wife cooks, he eats on plan. Progress to date was further reviewed.   Psychoeducation regarding mindfulness was provided. A handout was provided to Phillip Roach with further information regarding mindfulness, including exercises. This provider also explained the benefit of mindfulness as it relates to emotional eating. Phillip Roach was encouraged to engage in the provided exercises between now and the next appointment with this provider. Phillip Roach agreed. During today's appointment, Phillip Roach was led through a mindfulness exercise involving his senses. Phillip Roach provided  verbal consent during today's appointment for this provider to send a handout about mindfulness via e-mail. Phillip Roach was receptive to today's appointment as evidenced by openness to sharing, responsiveness to feedback, and willingness to engage in mindfulness exercises to assist with coping.  Mental Status Examination:  Appearance: well groomed and appropriate hygiene  Behavior: appropriate to circumstances Mood: euthymic; tired  Affect: mood congruent Speech: normal in rate, volume, and tone Eye Contact: appropriate Psychomotor Activity: unable to assess  Gait: unable to assess Thought Process: linear, logical, and goal directed  Thought Content/Perception: no hallucinations, delusions, bizarre thinking or behavior reported or observed and no evidence of suicidal and homicidal ideation, plan, and intent Orientation: time, person, place, and purpose of appointment Memory/Concentration: memory, attention, language, and fund of knowledge intact  Insight/Judgment: good  Interventions:  Conducted a brief chart review Provided empathic reflections and validation Employed supportive psychotherapy interventions to facilitate reduced distress and to improve coping skills with identified stressors Psychoeducation provided regarding mindfulness Engaged patient in mindfulness exercise(s)  DSM-5 Diagnosis(es): 311 (F32.8) Other Specified Depressive Disorder, Emotional Eating Behaviors  Treatment Goal & Progress: During the initial appointment with this provider, the following treatment goal was established: increase coping skills. Phillip Roach has demonstrated progress in his goal as evidenced by increased awareness of hunger patterns and increased awareness of triggers for emotional eating. Phillip Roach also continues to demonstrate willingness to engage in learned skill(s).  Plan: Due to Phillip Roach's upcoming surgery, the next appointment will be scheduled in 2-3 weeks, which will be via MyChart Video Visit. The next  session will focus on working towards the established treatment goal and termination planning.

## 2020-06-04 DIAGNOSIS — D3132 Benign neoplasm of left choroid: Secondary | ICD-10-CM | POA: Diagnosis not present

## 2020-06-04 DIAGNOSIS — H5213 Myopia, bilateral: Secondary | ICD-10-CM | POA: Diagnosis not present

## 2020-06-06 ENCOUNTER — Encounter: Payer: Self-pay | Admitting: Family Medicine

## 2020-06-06 ENCOUNTER — Telehealth (INDEPENDENT_AMBULATORY_CARE_PROVIDER_SITE_OTHER): Payer: 59 | Admitting: Family Medicine

## 2020-06-06 ENCOUNTER — Other Ambulatory Visit: Payer: Self-pay | Admitting: Family Medicine

## 2020-06-06 DIAGNOSIS — E786 Lipoprotein deficiency: Secondary | ICD-10-CM | POA: Diagnosis not present

## 2020-06-06 DIAGNOSIS — E291 Testicular hypofunction: Secondary | ICD-10-CM

## 2020-06-06 MED ORDER — TESTOSTERONE CYPIONATE 200 MG/ML IM SOLN: 200.0000 mg | INTRAMUSCULAR | 1 refills | Status: DC

## 2020-06-06 NOTE — Progress Notes (Signed)
Established Patient Office Visit  Subjective:  Patient ID: Phillip Roach, male    DOB: 1980-10-02  Age: 40 y.o. MRN: [id number]  CC:  Chief Complaint  Patient presents with  . Acute Visit    Visit testosterone levels, wants to discuss options for this issue.     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  He is interested in testosterone replacement therapy.  Continues to do well with Wellbutrin and talking therapy.  Past Medical History:  Diagnosis Date  . ADHD   . ADHD (attention deficit hyperactivity disorder)   . Asthma   . Back pain   . Depression   . Fatigue   . GERD (gastroesophageal reflux disease)   . Heartburn   . Hypertension   . Joint pain   . Lactose intolerance   . Overweight   . Sleep apnea   . Vitamin D deficiency     Past Surgical History:  Procedure Laterality Date  . FOREHEAD RECONSTRUCTION Right    Right Temple Shrapnel Removed  . PILONIDAL CYST DRAINAGE      Family History  Problem Relation Age of Onset  . GER disease Mother   . Depression Mother   . Alcohol abuse Mother   . Obesity Mother   . Anxiety disorder Father   . Hypertension Father   . Sleep apnea Father   . Obesity Father     Social History   Socioeconomic History  . Marital status: Married    Spouse name: Not on file  . Number of children: Not on file  . Years of education: Not on file  . Highest education level: Not on file  Occupational History  . Occupation: Paramedic  Tobacco Use  . Smoking status: Never Smoker  . Smokeless tobacco: Never Used  Substance and Sexual Activity  . Alcohol use: Yes    Comment: couple times a month  . Drug use: No  . Sexual activity: Not on file  Other Topics Concern  . Not on file  Social History Narrative   Married and has baby on the way    Works for [employer] to [employer]   Social Determinants of Radio broadcast assistant Strain: Not on [employer] Insecurity: Not on  file  Transportation Needs: Not on file  Physical Activity: Not on file  Stress: Not on file  Social Connections: Not on file  Intimate Partner Violence: Not on file    Outpatient Medications Prior to Visit  Medication Sig Dispense Refill  . acetaminophen (TYLENOL) 500 MG tablet Take 1 tablet (500 mg total) by mouth every 6 (six) hours as needed. 30 tablet 0  . b complex vitamins tablet Take 1 tablet by mouth daily.    [name] buPROPion (WELLBUTRIN XL) 300 MG 24 hr tablet Take 1 tablet (300 mg total) by mouth daily. 90 tablet 1  . ibuprofen (ADVIL,MOTRIN) 200 MG tablet Take 200 mg by mouth every 6 (six) hours as needed for pain.    [name] loratadine (CLARITIN) 10 MG tablet Take 10 mg by mouth daily.    [name] loratadine (CLARITIN) 10 MG tablet 1 a day PRN ALLERGY CONGESTION    . Semaglutide,0.25 or 0.5MG /DOS, (OZEMPIC, 0.25 OR 0.5 MG/DOSE,) 2 MG/1.5ML SOPN Inject 0.25 mg into the skin once a week. 1.5 mL 0  . triamcinolone (NASACORT) 55 MCG/ACT AERO nasal inhaler Place 2 sprays into the nose daily.    [name]  Vitamin D, Ergocalciferol, (DRISDOL) 1.25 MG (50000 UNIT) CAPS capsule Take 1 capsule (50,000 Units total) by mouth every 7 (seven) days. 4 capsule 0  . metFORMIN (GLUCOPHAGE) 500 MG tablet Take 1 tablet (500 mg total) by mouth daily with breakfast. (Patient not taking: Reported on 06/06/2020) 30 tablet 0   No facility-administered medications prior to visit.    Allergies  Allergen Reactions  . Shellfish Allergy     Iodine ok per pt  . Keflex [Cephalexin] Rash    ROS Review of Systems  Constitutional: Positive for fatigue. Negative for diaphoresis, fever and unexpected weight change.  HENT: Negative.   Respiratory: Negative.   Cardiovascular: Negative.   Genitourinary: Negative.       Objective:    Physical Exam Constitutional:      General: He is not in acute distress. Pulmonary:     Effort: Pulmonary effort is normal.  Neurological:     Mental Status: He is alert and oriented to  person, place, and time.  Psychiatric:        Mood and Affect: Mood normal.        Behavior: Behavior normal.     There were no vitals taken for this visit. Wt Readings from Last 3 Encounters:  05/20/20 (!) 315 lb (142.9 kg)  05/15/20 (!) 320 lb (145.2 kg)  05/01/20 (!) 316 lb (143.3 kg)     Health Maintenance Due  Topic Date Due  . Hepatitis C Screening  Never done  . HIV Screening  Never done    There are no preventive care reminders to display for this patient.  Lab Results  Component Value Date   TSH 0.775 07/16/2019   Lab Results  Component Value Date   WBC 8.1 05/15/2020   HGB 15.5 05/15/2020   HCT 44.9 05/15/2020   MCV 86.7 05/15/2020   PLT 230.0 05/15/2020   Lab Results  Component Value Date   NA 139 05/15/2020   K 4.2 05/15/2020   CO2 30 05/15/2020   GLUCOSE 83 05/15/2020   BUN 15 05/15/2020   CREATININE 0.89 05/15/2020   BILITOT 0.5 05/15/2020   ALKPHOS 50 05/15/2020   AST 14 05/15/2020   ALT 23 05/15/2020   PROT 7.2 05/15/2020   ALBUMIN 4.5 05/15/2020   CALCIUM 9.5 05/15/2020   ANIONGAP 10 07/16/2019   GFR 108.49 05/15/2020   Lab Results  Component Value Date   CHOL 177 02/25/2020   Lab Results  Component Value Date   HDL 33 (L) 02/25/2020   Lab Results  Component Value Date   LDLCALC 116 (H) 02/25/2020   Lab Results  Component Value Date   TRIG 159 (H) 02/25/2020   Lab Results  Component Value Date   CHOLHDL 5 06/27/2012   Lab Results  Component Value Date   HGBA1C 5.7 (H) 02/25/2020      Assessment & Plan:   Problem List Items Addressed This Visit   None     No orders of the defined types were placed in this encounter.   Follow-up: No follow-ups on file.  We discussed options for testosterone replacement.  Patient agrees to try biweekly injections.  He will make an appointment with our staff RN to possibly learn how to give himself injections.  We discussed his low HDL with mildly elevated LDL.  He knows that this  is somewhat of a genetic set point but he can increase his HDL levels with weight loss and exercise.  Discussed his erythrocytosis with testosterone  replacement.  He has been a [employer] donor in the past and would consider returning to that.  [name], MD   Virtual Visit via Video Note  I connected with Phillip Roach on 06/06/20 at 11:30 AM EDT by a video enabled telemedicine application and verified that I am speaking with the correct person using two identifiers.  Location: Patient: home  Provider:    I discussed the limitations of evaluation and management by telemedicine and the availability of in person appointments. The patient expressed understanding and agreed to proceed.  History of Present Illness:    Observations/Objective:   Assessment and Plan:   Follow Up Instructions:    I discussed the assessment and treatment plan with the patient. The patient was provided an opportunity to ask questions and all were answered. The patient agreed with the plan and demonstrated an understanding of the instructions.   The patient was advised to call back or seek an in-person evaluation if the symptoms worsen or if the condition fails to improve as anticipated.  I provided 20 minutes of non-face-to-face time during this encounter.   [name], MD

## 2020-06-09 ENCOUNTER — Telehealth: Payer: Self-pay

## 2020-06-09 ENCOUNTER — Encounter (INDEPENDENT_AMBULATORY_CARE_PROVIDER_SITE_OTHER): Payer: Self-pay | Admitting: Family Medicine

## 2020-06-09 ENCOUNTER — Other Ambulatory Visit: Payer: Self-pay

## 2020-06-09 ENCOUNTER — Other Ambulatory Visit (HOSPITAL_BASED_OUTPATIENT_CLINIC_OR_DEPARTMENT_OTHER): Payer: Self-pay

## 2020-06-09 ENCOUNTER — Ambulatory Visit (INDEPENDENT_AMBULATORY_CARE_PROVIDER_SITE_OTHER): Payer: 59 | Admitting: Family Medicine

## 2020-06-09 VITALS — BP 122/76 | HR 86 | Temp 98.1°F | Ht 74.0 in | Wt 311.0 lb

## 2020-06-09 DIAGNOSIS — E559 Vitamin D deficiency, unspecified: Secondary | ICD-10-CM | POA: Diagnosis not present

## 2020-06-09 DIAGNOSIS — Z9189 Other specified personal risk factors, not elsewhere classified: Secondary | ICD-10-CM | POA: Diagnosis not present

## 2020-06-09 DIAGNOSIS — Z6841 Body Mass Index (BMI) 40.0 and over, adult: Secondary | ICD-10-CM | POA: Diagnosis not present

## 2020-06-09 DIAGNOSIS — R7303 Prediabetes: Secondary | ICD-10-CM

## 2020-06-09 DIAGNOSIS — E66813 Obesity, class 3: Secondary | ICD-10-CM

## 2020-06-09 MED ORDER — SEMAGLUTIDE(0.25 OR 0.5MG/DOS) 2 MG/1.5ML ~~LOC~~ SOPN
0.5000 mg | PEN_INJECTOR | SUBCUTANEOUS | 0 refills | Status: DC
  Filled 2020-06-09: qty 1.5, 28d supply, fill #0

## 2020-06-09 MED ORDER — VITAMIN D (ERGOCALCIFEROL) 1.25 MG (50000 UNIT) PO CAPS
ORAL_CAPSULE | ORAL | 0 refills | Status: DC
  Filled 2020-06-09 – 2020-06-11 (×3): qty 4, 28d supply, fill #0

## 2020-06-09 MED FILL — Testosterone Cypionate IM Inj in Oil 200 MG/ML: INTRAMUSCULAR | 28 days supply | Qty: 10 | Fill #0 | Status: CN

## 2020-06-09 NOTE — Telephone Encounter (Signed)
PA for Testerone submitted to [place] through cover my meds.  Awaiting resonse.  Dm/cma  Key: [id number]

## 2020-06-10 ENCOUNTER — Ambulatory Visit: Payer: 59

## 2020-06-10 ENCOUNTER — Other Ambulatory Visit (HOSPITAL_BASED_OUTPATIENT_CLINIC_OR_DEPARTMENT_OTHER): Payer: Self-pay

## 2020-06-11 ENCOUNTER — Other Ambulatory Visit (HOSPITAL_BASED_OUTPATIENT_CLINIC_OR_DEPARTMENT_OTHER): Payer: Self-pay

## 2020-06-11 ENCOUNTER — Telehealth (INDEPENDENT_AMBULATORY_CARE_PROVIDER_SITE_OTHER): Payer: 59 | Admitting: Psychology

## 2020-06-11 DIAGNOSIS — F3289 Other specified depressive episodes: Secondary | ICD-10-CM | POA: Diagnosis not present

## 2020-06-13 ENCOUNTER — Other Ambulatory Visit (HOSPITAL_BASED_OUTPATIENT_CLINIC_OR_DEPARTMENT_OTHER): Payer: Self-pay

## 2020-06-13 DIAGNOSIS — F329 Major depressive disorder, single episode, unspecified: Secondary | ICD-10-CM | POA: Diagnosis not present

## 2020-06-13 MED FILL — Testosterone Cypionate IM Inj in Oil 200 MG/ML: INTRAMUSCULAR | 28 days supply | Qty: 10 | Fill #0 | Status: CN

## 2020-06-14 NOTE — Progress Notes (Signed)
Chief Complaint:   OBESITY Phillip Roach is here to discuss his progress with his obesity treatment plan along with follow-up of his obesity related diagnoses. Phillip Roach is on the Category 4 Plan and states he is following his eating plan approximately 75% of the time. Phillip Roach states he is doing work around the house 60 minutes 7 times per week.  Today's visit was #: 18 Starting weight: 325 lbs Starting date: 09/24/2019 Today's weight: 311 lbs Today's date: 06/09/2020 Total lbs lost to date: 14 lbs Total lbs lost since last in-office visit: 4  Interim History: Phillip Roach has been eating more food- now that his wife is on plan there has been more home cooked food. She denies GI side effects of Ozempic, even at increased dose of 0.5 mg. She has surgery for parotid cyst in the upcoming few weeks (anticipates at least 5 days off work) on April 19.  Subjective:   1. Pre-diabetes Phillip Roach's las A1c was 5.7 and insulin 9.7. He is on Ozempic 0.5 mg.  2. Vitamin D deficiency Phillip Roach denies nausea, vomiting, and muscle weakness but notes fatigue. Pt is on prescription Vit D.  3. At risk for deficient intake of food Phillip Roach is at risk for deficient intake of food due to upcoming jaw surgery.  Assessment/Plan:   1. Pre-diabetes Daylen will continue to work on weight loss, exercise, and decreasing simple carbohydrates to help decrease the risk of diabetes.   - Semaglutide,0.25 or 0.5MG /DOS, 2 MG/1.5ML SOPN; Inject 0.5 mg into the skin once a week. (Patient taking differently: Inject 0.5 mg into the skin every Monday.)  Dispense: 1.5 mL; Refill: 0  2. Vitamin D deficiency Low Vitamin D level contributes to fatigue and are associated with obesity, breast, and colon cancer. He agrees to continue to take prescription Vitamin D @50 ,000 IU every week and will follow-up for routine testing of Vitamin D, at least 2-3 times per year to avoid over-replacement.  - Vitamin D, Ergocalciferol, (DRISDOL) 1.25 MG (50000 UNIT) CAPS  capsule; TAKE 1 CAPSULE BY MOUTH EVERY 7 DAYS (Patient taking differently: Take 50,000 Units by mouth every Monday.)  Dispense: 4 capsule; Refill: 0  3. At risk for deficient intake of food Phillip Roach was given approximately 15 minutes of deficit intake of food prevention counseling today. Phillip Roach is at risk for eating too few calories based on current food recall. He was encouraged to focus on meeting caloric and protein goals according to his recommended meal plan.   4. Class 3 severe obesity with serious comorbidity and body mass index (BMI) of 40.0 to 44.9 in adult, unspecified obesity type [facility]) Eder is currently in the action stage of change. As such, his goal is to continue with weight loss efforts. He has agreed to the Category 4 Plan.   Exercise goals: All adults should avoid inactivity. Some physical activity is better than none, and adults who participate in any amount of physical activity gain some health benefits.  Behavioral modification strategies: increasing lean protein intake, meal planning and cooking strategies and keeping healthy foods in the home.  Phillip Roach has agreed to follow-up with our clinic in 3-4 weeks. He was informed of the importance of frequent follow-up visits to maximize his success with intensive lifestyle modifications for his multiple health conditions.   Objective:   Blood pressure 122/76, pulse 86, temperature 98.1 F (36.7 C), height 6\' 2"  (1.88 m), weight (!) 311 lb (141.1 kg), SpO2 97 %. Body mass index is 39.93 kg/m.  General: Cooperative,  alert, well developed, in no acute distress. HEENT: Conjunctivae and lids unremarkable. Cardiovascular: Regular rhythm.  Lungs: Normal work of breathing. Neurologic: No focal deficits.   Lab Results  Component Value Date   CREATININE 0.89 05/15/2020   BUN 15 05/15/2020   NA 139 05/15/2020   K 4.2 05/15/2020   CL 103 05/15/2020   CO2 30 05/15/2020   Lab Results  Component Value Date   ALT 23 05/15/2020   AST  14 05/15/2020   ALKPHOS 50 05/15/2020   BILITOT 0.5 05/15/2020   Lab Results  Component Value Date   HGBA1C 5.7 (H) 02/25/2020   HGBA1C 5.7 (H) 09/24/2019   Lab Results  Component Value Date   INSULIN 9.7 02/25/2020   INSULIN 17.3 09/24/2019   Lab Results  Component Value Date   TSH 0.775 07/16/2019   Lab Results  Component Value Date   CHOL 177 02/25/2020   HDL 33 (L) 02/25/2020   LDLCALC 116 (H) 02/25/2020   LDLDIRECT 85.4 07/28/2007   TRIG 159 (H) 02/25/2020   CHOLHDL 5 06/27/2012   Lab Results  Component Value Date   WBC 8.1 05/15/2020   HGB 15.5 05/15/2020   HCT 44.9 05/15/2020   MCV 86.7 05/15/2020   PLT 230.0 05/15/2020    Attestation Statements:   Reviewed by clinician on day of visit: allergies, medications, problem list, medical history, surgical history, family history, social history, and previous encounter notes.  Coral Ceo, am acting as transcriptionist for Coralie Common, MD.   I have reviewed the above documentation for accuracy and completeness, and I agree with the above. - Jinny Blossom, MD

## 2020-06-16 ENCOUNTER — Other Ambulatory Visit: Payer: Self-pay

## 2020-06-16 ENCOUNTER — Other Ambulatory Visit (HOSPITAL_BASED_OUTPATIENT_CLINIC_OR_DEPARTMENT_OTHER): Payer: Self-pay

## 2020-06-16 MED FILL — Testosterone Cypionate IM Inj in Oil 200 MG/ML: INTRAMUSCULAR | 90 days supply | Qty: 10 | Fill #0 | Status: CN

## 2020-06-16 NOTE — Telephone Encounter (Signed)
PA denied due to didn't meet required recommendations.     For the treatment of hypogonadism (low testosterone), our guideline named TESTOSTERONE (which includes testosterone cypionate 200mg /mL injection) requires that you must meet ONE of the following laboratory requirements confirming low testosterone for approval: 1) At least two morning total serum testosterone levels (measures free testosterone and testosterone bound to proteins in the blood) of less than 300 ng/dL (10.4 nmol/L) taken on separate occasions while in a fasted state (you have not eaten). 2) A free serum testosterone level (measures testosterone that is not bound to proteins in the blood) of less than 5 pg/mL (0.17 nmol/L). This request was denied because we did not receive information that you meet at least ONE of the requirements listed above. Please work with your doctor to use a different medication or get [place] more information if it will allow [place] to approve this request. A written notification letter will follow with additional details.  Please review and advise.  Thanks. Dm/cma

## 2020-06-16 NOTE — Addendum Note (Signed)
Addended by: Jon Billings on: 06/16/2020 12:45 PM   Modules accepted: Orders

## 2020-06-16 NOTE — Telephone Encounter (Signed)
   testosterone cypionate (DEPOTESTOSTERONE CYPIONATE) 200 MG/ML  Pt checking on status of PA for this med. His call back # is [phone]  Thank you

## 2020-06-16 NOTE — Telephone Encounter (Signed)
Have ordered required labs. Please ask him to return fasting for labs ordered.

## 2020-06-16 NOTE — Telephone Encounter (Signed)
Pt notified [place] phone. Dm/cma

## 2020-06-16 NOTE — Progress Notes (Signed)
  Office: [phone]  /  Fax: [phone]    Date: June 30, 2020   Appointment Start Time: 2:00pm Duration: 18 minutes Provider: Glennie Isle, Psy.D. Type of Session: Individual Therapy  Location of Patient: Home Location of Provider: Provider's Home (private office) Type of Contact: Telepsychological Visit via MyChart Video Visit  Session Content: Phillip Roach is a 40 y.o. male presenting for a follow-up appointment to address the previously established treatment goal of increasing coping skills. Today's appointment was a telepsychological visit due to COVID-19. Phillip Roach provided verbal consent for today's telepsychological appointment and he is aware he is responsible for securing confidentiality on his end of the session. Prior to proceeding with today's appointment, Phillip Roach's physical location at the time of this appointment was obtained as well a phone number he could be reached at in the event of technical difficulties. Phillip Roach and this provider participated in today's telepsychological service.   This provider conducted a brief check-in. 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 Phillip Roach shared he is "on track" with his eating habits. He recalled one instance of emotional eating where he consumed jelly beans, noting the increased awareness helped him engage in portion control. Positive reinforcement was provided.   Session focused further on mindfulness to assist with coping. Phillip Roach recalled an instance of engaging in the previously shared exercise involving his senses. Phillip Roach was led through a mindfulness exercise (A Taste of Mindfulness) and his experience was processed. Phillip Roach provided verbal consent during today's appointment for this provider to send a handout for today's exercise via e-mail. This provider also discussed the utilization of YouTube for mindfulness exercises (e.g., exercises by Merri Ray). Furthermore,  termination planning was discussed. Phillip Roach was receptive to a follow-up appointment in 3-4 weeks and an additional follow-up/termination appointment in 3-4 weeks after that. Overall, Phillip Roach was receptive to today's appointment as evidenced by openness to sharing, responsiveness to feedback, and willingness to continue engaging in mindfulness exercises.  Mental Status Examination:  Appearance: well groomed and appropriate hygiene  Behavior: appropriate to circumstances Mood: euthymic Affect: mood congruent Speech: normal in rate, volume, and tone Eye Contact: appropriate Psychomotor Activity: unable to assess  Gait: unable to assess Thought Process: linear, logical, and goal directed  Thought Content/Perception: no hallucinations, delusions, bizarre thinking or behavior reported or observed and no evidence or endorsement of suicidal and homicidal ideation, plan, and intent Orientation: time, person, place, and purpose of appointment Memory/Concentration: memory, attention, language, and fund of knowledge intact  Insight/Judgment: good  Interventions:  Conducted a brief chart review Provided empathic reflections and validation Reviewed content from the previous session Employed supportive psychotherapy interventions to facilitate reduced distress and to improve coping skills with identified stressors Engaged patient in mindfulness exercise(s) Discussed termination planning  Provided positive reinforcement   DSM-5 Diagnosis(es): 311 (F32.8) Other Specified Depressive Disorder, Emotional Eating Behaviors  Treatment Goal & Progress: During the initial appointment with this provider, the following treatment goal was established: increase coping skills. Phillip Roach has demonstrated progress in his goal as evidenced by increased awareness of hunger patterns, increased awareness of triggers for emotional eating, and reduction in emotional eating behaviors. Phillip Roach also continues to demonstrate willingness  to engage in learned skill(s).  Plan: The next appointment will be scheduled in 3-4 weeks, which will be via MyChart Video Visit. The next session will focus on working towards the established treatment goal.

## 2020-06-17 ENCOUNTER — Other Ambulatory Visit: Payer: Self-pay

## 2020-06-17 ENCOUNTER — Other Ambulatory Visit (INDEPENDENT_AMBULATORY_CARE_PROVIDER_SITE_OTHER): Payer: 59

## 2020-06-17 ENCOUNTER — Other Ambulatory Visit (HOSPITAL_BASED_OUTPATIENT_CLINIC_OR_DEPARTMENT_OTHER): Payer: Self-pay

## 2020-06-17 ENCOUNTER — Ambulatory Visit (INDEPENDENT_AMBULATORY_CARE_PROVIDER_SITE_OTHER): Payer: Self-pay | Admitting: Otolaryngology

## 2020-06-17 DIAGNOSIS — D490 Neoplasm of unspecified behavior of digestive system: Secondary | ICD-10-CM

## 2020-06-17 DIAGNOSIS — E291 Testicular hypofunction: Secondary | ICD-10-CM

## 2020-06-17 LAB — TESTOSTERONE: Testosterone: 193.24 ng/dL — ABNORMAL LOW (ref 300.00–890.00)

## 2020-06-17 NOTE — H&P (View-Only) (Signed)
PREOPERATIVE H&P  Chief Complaint: Left parotid mass  HPI: Phillip Roach is a 40 y.o. male who presents for evaluation of slowly enlarging left parotid mass.  This was first noted on a CT scan in 2019.  He had an ultrasound performed in June of last year that showed a 3.7 cm left parotid mass.  He feels like it has gotten larger.  He is taken to the operating room at this time for left superficial parotidectomy with removal of parotid mass with facial nerve dissection.  Past Medical History:  Diagnosis Date  . ADHD   . ADHD (attention deficit hyperactivity disorder)   . Asthma   . Back pain   . Depression   . Fatigue   . GERD (gastroesophageal reflux disease)   . Heartburn   . Hypertension   . Joint pain   . Lactose intolerance   . Overweight   . Sleep apnea   . Vitamin D deficiency    Past Surgical History:  Procedure Laterality Date  . FOREHEAD RECONSTRUCTION Right    Right Temple Shrapnel Removed  . PILONIDAL CYST DRAINAGE     Social History   Socioeconomic History  . Marital status: Married    Spouse name: Not on file  . Number of children: Not on file  . Years of education: Not on file  . Highest education level: Not on file  Occupational History  . Occupation: Paramedic  Tobacco Use  . Smoking status: Never Smoker  . Smokeless tobacco: Never Used  Substance and Sexual Activity  . Alcohol use: Yes    Comment: couple times a month  . Drug use: No  . Sexual activity: Not on file  Other Topics Concern  . Not on file  Social History Narrative   Married and has baby on the way    Works for [employer] to [place] [employer] [employer]   Social Determinants of Radio broadcast assistant Strain: Not on [employer] Insecurity: Not on file  Transportation Needs: Not on file  Physical Activity: Not on file  Stress: Not on file  Social Connections: Not on file   Family History  Problem Relation Age of Onset  . GER disease Mother   . Depression Mother   . Alcohol abuse  Mother   . Obesity Mother   . Anxiety disorder Father   . Hypertension Father   . Sleep apnea Father   . Obesity Father    Allergies  Allergen Reactions  . Shellfish Allergy Anaphylaxis    Iodine ok per pt  . Keflex [Cephalexin] Rash   Prior to Admission medications   Medication Sig Start Date End Date Taking? Authorizing Provider  acetaminophen (TYLENOL) 500 MG tablet Take 1 tablet (500 mg total) by mouth every 6 (six) hours as needed. Patient taking differently: Take 1,000 mg by mouth every 6 (six) hours as needed for moderate pain or headache. 07/16/19   Lamptey, Myrene Galas, MD  b complex vitamins tablet Take 2 tablets by mouth daily.    [provider]  buPROPion (WELLBUTRIN XL) 300 MG 24 hr tablet Take 1 tablet (300 mg total) by mouth daily. 08/14/19   [name], MD  ibuprofen (ADVIL,MOTRIN) 200 MG tablet Take 800 mg by mouth every 6 (six) hours as needed for pain.    [provider]  loratadine (CLARITIN) 10 MG tablet Take 10 mg by mouth daily.    [provider]  MELATONIN PO Take 4  mg by mouth at bedtime as needed (sleep).    [provider]  metFORMIN (GLUCOPHAGE) 500 MG tablet TAKE 1 TABLET (500 MG TOTAL) BY MOUTH DAILY WITH BREAKFAST. Patient not taking: Reported on 06/12/2020 05/01/20 05/01/21  Laqueta Linden, MD  Semaglutide,0.25 or 0.5MG /DOS, 2 MG/1.5ML SOPN Inject 0.5 mg into the skin once a week. Patient taking differently: Inject 0.5 mg into the skin every Monday. 06/09/20 06/09/21  Laqueta Linden, MD  testosterone cypionate (DEPOTESTOSTERONE CYPIONATE) 200 MG/ML injection INJECT 1 ML (200 MG TOTAL) INTO THE MUSCLE EVERY 14 (FOURTEEN) DAYS. 06/06/20 12/03/20  [name], MD  triamcinolone (NASACORT) 55 MCG/ACT AERO nasal inhaler Place 1 spray into the nose daily.    [provider]  Vitamin D, Ergocalciferol, (DRISDOL) 1.25 MG (50000 UNIT) CAPS capsule TAKE 1 CAPSULE BY MOUTH EVERY 7 DAYS Patient taking  differently: Take 50,000 Units by mouth every Monday. 06/09/20 06/09/21  Laqueta Linden, MD     Positive ROS: Otherwise negative  All other systems have been reviewed and were otherwise negative with the exception of those mentioned in the HPI and as above.  Physical Exam: There were no vitals filed for this visit.  General: Alert, no acute distress Oral: Normal oral mucosa and tonsils Nasal: Clear nasal passages Neck: No palpable adenopathy or thyroid nodules.  Patient has approximately 4 cm left parotid mass.  He has no palpable adenopathy lower in the neck.  He has normal facial nerve function. Ear: Ear canal is clear with normal appearing TMs Cardiovascular: Regular rate and rhythm, no murmur.  Respiratory: Clear to auscultation Neurologic: Alert and oriented x 3   Assessment/Plan: Left parotid mass  Plan for left superficial parotidectomy with facial nerve dissection.   Melony Overly, MD 06/17/2020 5:29 PM

## 2020-06-17 NOTE — Telephone Encounter (Signed)
Patient notified VIA phone and will come in today at 10 am for fasting labs. Dm/cma

## 2020-06-17 NOTE — H&P (Signed)
PREOPERATIVE H&P  Chief Complaint: Left parotid mass  HPI: Phillip Roach is a 40 y.o. male who presents for evaluation of slowly enlarging left parotid mass.  This was first noted on a CT scan in 2019.  He had an ultrasound performed in June of last year that showed a 3.7 cm left parotid mass.  He feels like it has gotten larger.  He is taken to the operating room at this time for left superficial parotidectomy with removal of parotid mass with facial nerve dissection.  Past Medical History:  Diagnosis Date  . ADHD   . ADHD (attention deficit hyperactivity disorder)   . Asthma   . Back pain   . Depression   . Fatigue   . GERD (gastroesophageal reflux disease)   . Heartburn   . Hypertension   . Joint pain   . Lactose intolerance   . Overweight   . Sleep apnea   . Vitamin D deficiency    Past Surgical History:  Procedure Laterality Date  . FOREHEAD RECONSTRUCTION Right    Right Temple Shrapnel Removed  . PILONIDAL CYST DRAINAGE     Social History   Socioeconomic History  . Marital status: Married    Spouse name: Not on file  . Number of children: Not on file  . Years of education: Not on file  . Highest education level: Not on file  Occupational History  . Occupation: Paramedic  Tobacco Use  . Smoking status: Never Smoker  . Smokeless tobacco: Never Used  Substance and Sexual Activity  . Alcohol use: Yes    Comment: couple times a month  . Drug use: No  . Sexual activity: Not on file  Other Topics Concern  . Not on file  Social History Narrative   Married and has baby on the way    Works for [employer] to [employer]   Social Determinants of Radio broadcast assistant Strain: Not on [employer] Insecurity: Not on file  Transportation Needs: Not on file  Physical Activity: Not on file  Stress: Not on file  Social Connections: Not on file   Family History  Problem Relation Age of Onset  . GER disease Mother   . Depression Mother   . Alcohol abuse  Mother   . Obesity Mother   . Anxiety disorder Father   . Hypertension Father   . Sleep apnea Father   . Obesity Father    Allergies  Allergen Reactions  . Shellfish Allergy Anaphylaxis    Iodine ok per pt  . Keflex [Cephalexin] Rash   Prior to Admission medications   Medication Sig Start Date End Date Taking? Authorizing Provider  acetaminophen (TYLENOL) 500 MG tablet Take 1 tablet (500 mg total) by mouth every 6 (six) hours as needed. Patient taking differently: Take 1,000 mg by mouth every 6 (six) hours as needed for moderate pain or headache. 07/16/19   Lamptey, Myrene Galas, MD  b complex vitamins tablet Take 2 tablets by mouth daily.    [provider]  buPROPion (WELLBUTRIN XL) 300 MG 24 hr tablet Take 1 tablet (300 mg total) by mouth daily. 08/14/19   [name], MD  ibuprofen (ADVIL,MOTRIN) 200 MG tablet Take 800 mg by mouth every 6 (six) hours as needed for pain.    [provider]  loratadine (CLARITIN) 10 MG tablet Take 10 mg by mouth daily.    [provider]  MELATONIN PO Take 4  mg by mouth at bedtime as needed (sleep).    [provider]  metFORMIN (GLUCOPHAGE) 500 MG tablet TAKE 1 TABLET (500 MG TOTAL) BY MOUTH DAILY WITH BREAKFAST. Patient not taking: Reported on 06/12/2020 05/01/20 05/01/21  Laqueta Linden, MD  Semaglutide,0.25 or 0.5MG /DOS, 2 MG/1.5ML SOPN Inject 0.5 mg into the skin once a week. Patient taking differently: Inject 0.5 mg into the skin every Monday. 06/09/20 06/09/21  Laqueta Linden, MD  testosterone cypionate (DEPOTESTOSTERONE CYPIONATE) 200 MG/ML injection INJECT 1 ML (200 MG TOTAL) INTO THE MUSCLE EVERY 14 (FOURTEEN) DAYS. 06/06/20 12/03/20  [name], MD  triamcinolone (NASACORT) 55 MCG/ACT AERO nasal inhaler Place 1 spray into the nose daily.    [provider]  Vitamin D, Ergocalciferol, (DRISDOL) 1.25 MG (50000 UNIT) CAPS capsule TAKE 1 CAPSULE BY MOUTH EVERY 7 DAYS Patient taking  differently: Take 50,000 Units by mouth every Monday. 06/09/20 06/09/21  Laqueta Linden, MD     Positive ROS: Otherwise negative  All other systems have been reviewed and were otherwise negative with the exception of those mentioned in the HPI and as above.  Physical Exam: There were no vitals filed for this visit.  General: Alert, no acute distress Oral: Normal oral mucosa and tonsils Nasal: Clear nasal passages Neck: No palpable adenopathy or thyroid nodules.  Patient has approximately 4 cm left parotid mass.  He has no palpable adenopathy lower in the neck.  He has normal facial nerve function. Ear: Ear canal is clear with normal appearing TMs Cardiovascular: Regular rate and rhythm, no murmur.  Respiratory: Clear to auscultation Neurologic: Alert and oriented x 3   Assessment/Plan: Left parotid mass  Plan for left superficial parotidectomy with facial nerve dissection.   Melony Overly, MD 06/17/2020 5:29 PM

## 2020-06-18 ENCOUNTER — Encounter: Payer: Self-pay | Admitting: Family Medicine

## 2020-06-18 LAB — TESTOSTERONE TOTAL,FREE,BIO, MALES
Albumin: 4.6 g/dL (ref 3.6–5.1)
Sex Hormone Binding: 15 nmol/L (ref 10–50)
Testosterone: 160 ng/dL — ABNORMAL LOW (ref 250–827)

## 2020-06-18 NOTE — Addendum Note (Signed)
Addended by: Jon Billings on: 06/18/2020 08:01 AM   Modules accepted: Orders

## 2020-06-20 ENCOUNTER — Other Ambulatory Visit (HOSPITAL_BASED_OUTPATIENT_CLINIC_OR_DEPARTMENT_OTHER): Payer: Self-pay

## 2020-06-20 ENCOUNTER — Other Ambulatory Visit: Payer: Self-pay

## 2020-06-23 ENCOUNTER — Ambulatory Visit (INDEPENDENT_AMBULATORY_CARE_PROVIDER_SITE_OTHER): Payer: 59 | Admitting: Family Medicine

## 2020-06-23 ENCOUNTER — Other Ambulatory Visit (HOSPITAL_COMMUNITY)
Admission: RE | Admit: 2020-06-23 | Discharge: 2020-06-23 | Disposition: A | Discharge status: Home / Self Care | Payer: 59 | Source: Ambulatory Visit | Attending: Otolaryngology | Admitting: Otolaryngology

## 2020-06-23 ENCOUNTER — Telehealth: Payer: Self-pay | Admitting: Family Medicine

## 2020-06-23 ENCOUNTER — Encounter (HOSPITAL_COMMUNITY): Payer: Self-pay | Admitting: Otolaryngology

## 2020-06-23 ENCOUNTER — Other Ambulatory Visit: Payer: Self-pay

## 2020-06-23 DIAGNOSIS — Z01812 Encounter for preprocedural laboratory examination: Secondary | ICD-10-CM | POA: Insufficient documentation

## 2020-06-23 DIAGNOSIS — Z20822 Contact with and (suspected) exposure to covid-19: Secondary | ICD-10-CM | POA: Diagnosis not present

## 2020-06-23 MED ORDER — VANCOMYCIN HCL 1500 MG/300ML IV SOLN
1500.0000 mg | INTRAVENOUS | Status: AC
  Administered 2020-06-24: 1500 mg via INTRAVENOUS
  Filled 2020-06-23: qty 300

## 2020-06-23 NOTE — Progress Notes (Signed)
PCP -  [facility] - Dr Coralie Common  Cardiologist - n/a  Chest x-ray - n/a EKG - 09/24/19 Stress Test - n/a ECHO - n/a Cardiac Cath - n/a  Sleep Study -  Yes CPAP - does not use cpap  STOP now taking any Aspirin (unless otherwise instructed by your surgeon), Aleve, Naproxen, Ibuprofen, Motrin, Advil, Goody's, BC's, all herbal medications, fish oil, and all vitamins.   Coronavirus Screening Covid test is scheduled on 06/23/20 Do you have any of the following symptoms:  Cough yes/no: No Fever (>100.66F)  yes/no: No Runny nose Yes - seasonal allergies Sore throat yes/no: No Difficulty breathing/shortness of breath  yes/no: No  Have you traveled in the last 14 days and where? yes/no: No  Patient verbalized understanding of instructions that were given via phone.

## 2020-06-23 NOTE — Anesthesia Preprocedure Evaluation (Addendum)
Anesthesia Evaluation  Patient identified by MRN, date of birth, ID band Patient awake    Reviewed: Allergy & Precautions, NPO status , Patient's Chart, lab work & pertinent test results  Airway Mallampati: II  TM Distance: >3 FB Neck ROM: Full    Dental  (+) Dental Advisory Given   Pulmonary asthma , sleep apnea ,    breath sounds clear to auscultation       Cardiovascular hypertension,  Rhythm:Regular Rate:Normal     Neuro/Psych negative neurological ROS     GI/Hepatic Neg liver ROS, GERD  ,  Endo/Other  negative endocrine ROS  Renal/GU negative Renal ROS     Musculoskeletal   Abdominal (+) + obese,   Peds  Hematology negative hematology ROS (+)   Anesthesia Other Findings   Reproductive/Obstetrics                           Lab Results  Component Value Date   WBC 8.1 05/15/2020   HGB 15.5 05/15/2020   HCT 44.9 05/15/2020   MCV 86.7 05/15/2020   PLT 230.0 05/15/2020   Lab Results  Component Value Date   CREATININE 0.89 05/15/2020   BUN 15 05/15/2020   NA 139 05/15/2020   K 4.2 05/15/2020   CL 103 05/15/2020   CO2 30 05/15/2020    Anesthesia Physical Anesthesia Plan  ASA: II  Anesthesia Plan: General   Post-op Pain Management:    Induction: Intravenous  PONV Risk Score and Plan: 2 and Dexamethasone, Ondansetron and Treatment may vary due to age or medical condition  Airway Management Planned: Oral ETT  Additional Equipment:   Intra-op Plan:   Post-operative Plan: Extubation in OR  Informed Consent: I have reviewed the patients History and Physical, chart, labs and discussed the procedure including the risks, benefits and alternatives for the proposed anesthesia with the patient or authorized representative who has indicated his/her understanding and acceptance.     Dental advisory given  Plan Discussed with: CRNA  Anesthesia Plan Comments: (GETA.  Remifentanil gtt. )      Anesthesia Quick Evaluation

## 2020-06-23 NOTE — Telephone Encounter (Signed)
Patient is calling with questions pertaining to his Testosterone. He said that the initial approval was denied, but he wants to resubmit it. Please call him at [phone] if you have any questions.

## 2020-06-24 ENCOUNTER — Encounter (HOSPITAL_COMMUNITY)
Admission: RE | Disposition: A | Discharge status: Home / Self Care | Payer: Self-pay | Source: Home / Self Care | Attending: Otolaryngology

## 2020-06-24 ENCOUNTER — Ambulatory Visit (HOSPITAL_COMMUNITY): Payer: 59 | Admitting: Certified Registered Nurse Anesthetist

## 2020-06-24 ENCOUNTER — Other Ambulatory Visit: Payer: Self-pay

## 2020-06-24 ENCOUNTER — Observation Stay (HOSPITAL_COMMUNITY)
Admission: RE | Admit: 2020-06-24 | Discharge: 2020-06-25 | Disposition: A | Discharge status: Home / Self Care | Payer: 59 | Attending: Otolaryngology | Admitting: Otolaryngology

## 2020-06-24 ENCOUNTER — Encounter (HOSPITAL_COMMUNITY): Payer: Self-pay | Admitting: Otolaryngology

## 2020-06-24 DIAGNOSIS — K219 Gastro-esophageal reflux disease without esophagitis: Secondary | ICD-10-CM | POA: Insufficient documentation

## 2020-06-24 DIAGNOSIS — Z79899 Other long term (current) drug therapy: Secondary | ICD-10-CM | POA: Diagnosis not present

## 2020-06-24 DIAGNOSIS — J45909 Unspecified asthma, uncomplicated: Secondary | ICD-10-CM | POA: Diagnosis not present

## 2020-06-24 DIAGNOSIS — D110 Benign neoplasm of parotid gland: Secondary | ICD-10-CM | POA: Diagnosis not present

## 2020-06-24 DIAGNOSIS — I10 Essential (primary) hypertension: Secondary | ICD-10-CM | POA: Diagnosis not present

## 2020-06-24 DIAGNOSIS — F329 Major depressive disorder, single episode, unspecified: Secondary | ICD-10-CM | POA: Diagnosis not present

## 2020-06-24 DIAGNOSIS — D37030 Neoplasm of uncertain behavior of the parotid salivary glands: Secondary | ICD-10-CM | POA: Diagnosis not present

## 2020-06-24 DIAGNOSIS — D490 Neoplasm of unspecified behavior of digestive system: Secondary | ICD-10-CM | POA: Diagnosis present

## 2020-06-24 DIAGNOSIS — Z20822 Contact with and (suspected) exposure to covid-19: Secondary | ICD-10-CM | POA: Diagnosis not present

## 2020-06-24 DIAGNOSIS — G4730 Sleep apnea, unspecified: Secondary | ICD-10-CM | POA: Diagnosis not present

## 2020-06-24 HISTORY — PX: PAROTIDECTOMY: SHX2163

## 2020-06-24 HISTORY — DX: Prediabetes: R73.03

## 2020-06-24 LAB — CBC
HCT: 44.6 % (ref 39.0–52.0)
Hemoglobin: 15.1 g/dL (ref 13.0–17.0)
MCH: 30.3 pg (ref 26.0–34.0)
MCHC: 33.9 g/dL (ref 30.0–36.0)
MCV: 89.6 fL (ref 80.0–100.0)
Platelets: 241 10*3/uL (ref 150–400)
RBC: 4.98 MIL/uL (ref 4.22–5.81)
RDW: 12 % (ref 11.5–15.5)
WBC: 8.9 10*3/uL (ref 4.0–10.5)
nRBC: 0 % (ref 0.0–0.2)

## 2020-06-24 LAB — BASIC METABOLIC PANEL
Anion gap: 8 (ref 5–15)
BUN: 10 mg/dL (ref 6–20)
CO2: 25 mmol/L (ref 22–32)
Calcium: 9.1 mg/dL (ref 8.9–10.3)
Chloride: 105 mmol/L (ref 98–111)
Creatinine, Ser: 0.85 mg/dL (ref 0.61–1.24)
GFR, Estimated: >60 mL/min (ref >60)
Glucose, Bld: 87 mg/dL (ref 70–99)
Potassium: 4.3 mmol/L (ref 3.5–5.1)
Sodium: 138 mmol/L (ref 135–145)

## 2020-06-24 LAB — SARS CORONAVIRUS 2 (TAT 6-24 HRS): SARS Coronavirus 2: NEGATIVE

## 2020-06-24 LAB — SARS CORONAVIRUS 2 BY RT PCR (HOSPITAL ORDER, PERFORMED IN ~~LOC~~ HOSPITAL LAB): SARS Coronavirus 2: NEGATIVE

## 2020-06-24 SURGERY — EXCISION, PAROTID GLAND
Anesthesia: General | Site: Face | Laterality: Left

## 2020-06-24 MED ORDER — FENTANYL CITRATE (PF) 250 MCG/5ML IJ SOLN
INTRAMUSCULAR | Status: AC
  Filled 2020-06-24: qty 5

## 2020-06-24 MED ORDER — LACTATED RINGERS IV SOLN: INTRAVENOUS | Status: DC | PRN

## 2020-06-24 MED ORDER — ACETAMINOPHEN 650 MG RE SUPP: 650.0000 mg | RECTAL | Status: DC | PRN

## 2020-06-24 MED ORDER — LIDOCAINE-EPINEPHRINE 1 %-1:100000 IJ SOLN
INTRAMUSCULAR | Status: DC | PRN
  Administered 2020-06-24: 20 mL

## 2020-06-24 MED ORDER — LACTATED RINGERS IV SOLN: INTRAVENOUS | Status: DC

## 2020-06-24 MED ORDER — VANCOMYCIN HCL 1000 MG/200ML IV SOLN
1000.0000 mg | Freq: Two times a day (BID) | INTRAVENOUS | Status: DC
  Administered 2020-06-24 (×2): 1000 mg via INTRAVENOUS
  Filled 2020-06-24 (×3): qty 200

## 2020-06-24 MED ORDER — CHLORHEXIDINE GLUCONATE 0.12 % MT SOLN: 15.0000 mL | Freq: Once | OROMUCOSAL | Status: AC

## 2020-06-24 MED ORDER — ACETAMINOPHEN 500 MG PO TABS
1000.0000 mg | ORAL_TABLET | Freq: Once | ORAL | Status: AC
  Administered 2020-06-24: 1000 mg via ORAL
  Filled 2020-06-24: qty 2

## 2020-06-24 MED ORDER — BUPROPION HCL ER (XL) 300 MG PO TB24
300.0000 mg | ORAL_TABLET | Freq: Every day | ORAL | Status: DC
  Filled 2020-06-24 (×2): qty 1

## 2020-06-24 MED ORDER — ORAL CARE MOUTH RINSE: 15.0000 mL | Freq: Once | OROMUCOSAL | Status: AC

## 2020-06-24 MED ORDER — FENTANYL CITRATE (PF) 100 MCG/2ML IJ SOLN: 25.0000 ug | INTRAMUSCULAR | Status: DC | PRN

## 2020-06-24 MED ORDER — MORPHINE SULFATE (PF) 2 MG/ML IV SOLN: 2.0000 mg | INTRAVENOUS | Status: DC | PRN

## 2020-06-24 MED ORDER — ACETAMINOPHEN 160 MG/5ML PO SOLN
650.0000 mg | ORAL | Status: DC | PRN
  Administered 2020-06-24 (×2): 650 mg via ORAL
  Filled 2020-06-24 (×2): qty 20.3

## 2020-06-24 MED ORDER — CHLORHEXIDINE GLUCONATE CLOTH 2 % EX PADS: 6.0000 | MEDICATED_PAD | Freq: Once | CUTANEOUS | Status: DC

## 2020-06-24 MED ORDER — AMISULPRIDE (ANTIEMETIC) 5 MG/2ML IV SOLN: 10.0000 mg | Freq: Once | INTRAVENOUS | Status: DC | PRN

## 2020-06-24 MED ORDER — MIDAZOLAM HCL 2 MG/2ML IJ SOLN
INTRAMUSCULAR | Status: AC
  Filled 2020-06-24: qty 2

## 2020-06-24 MED ORDER — PHENYLEPHRINE HCL-NACL 10-0.9 MG/250ML-% IV SOLN
INTRAVENOUS | Status: DC | PRN
  Administered 2020-06-24: 40 ug/min via INTRAVENOUS

## 2020-06-24 MED ORDER — TRIAMCINOLONE ACETONIDE 55 MCG/ACT NA AERO
1.0000 | INHALATION_SPRAY | Freq: Every day | NASAL | Status: DC
  Administered 2020-06-24 – 2020-06-25 (×2): 1 via NASAL
  Filled 2020-06-24: qty 10.8

## 2020-06-24 MED ORDER — DEXAMETHASONE SODIUM PHOSPHATE 10 MG/ML IJ SOLN
INTRAMUSCULAR | Status: DC | PRN
  Administered 2020-06-24: 8 mg via INTRAVENOUS

## 2020-06-24 MED ORDER — BACITRACIN ZINC 500 UNIT/GM EX OINT
1.0000 "application " | TOPICAL_OINTMENT | Freq: Three times a day (TID) | CUTANEOUS | Status: DC
  Administered 2020-06-24 – 2020-06-25 (×4): 1 via TOPICAL
  Filled 2020-06-24: qty 28.35

## 2020-06-24 MED ORDER — PROPOFOL 10 MG/ML IV BOLUS
INTRAVENOUS | Status: DC | PRN
  Administered 2020-06-24: 250 mg via INTRAVENOUS

## 2020-06-24 MED ORDER — LIDOCAINE 2% (20 MG/ML) 5 ML SYRINGE
INTRAMUSCULAR | Status: DC | PRN
  Administered 2020-06-24: 60 mg via INTRAVENOUS

## 2020-06-24 MED ORDER — PROPOFOL 10 MG/ML IV BOLUS
INTRAVENOUS | Status: AC
  Filled 2020-06-24: qty 40

## 2020-06-24 MED ORDER — SUCCINYLCHOLINE CHLORIDE 20 MG/ML IJ SOLN
INTRAMUSCULAR | Status: DC | PRN
  Administered 2020-06-24: 120 mg via INTRAVENOUS

## 2020-06-24 MED ORDER — SODIUM CHLORIDE 0.9 % IV SOLN
0.0500 ug/kg/min | INTRAVENOUS | Status: AC
  Administered 2020-06-24: .2 ug/kg/min via INTRAVENOUS
  Filled 2020-06-24: qty 5000

## 2020-06-24 MED ORDER — FENTANYL CITRATE (PF) 100 MCG/2ML IJ SOLN
INTRAMUSCULAR | Status: DC | PRN
  Administered 2020-06-24: 150 ug via INTRAVENOUS

## 2020-06-24 MED ORDER — ONDANSETRON HCL 4 MG PO TABS: 4.0000 mg | ORAL_TABLET | ORAL | Status: DC | PRN

## 2020-06-24 MED ORDER — DEXMEDETOMIDINE (PRECEDEX) IN NS 20 MCG/5ML (4 MCG/ML) IV SYRINGE
PREFILLED_SYRINGE | INTRAVENOUS | Status: DC | PRN
  Administered 2020-06-24: 8 ug via INTRAVENOUS
  Administered 2020-06-24: 4 ug via INTRAVENOUS
  Administered 2020-06-24: 8 ug via INTRAVENOUS

## 2020-06-24 MED ORDER — SEMAGLUTIDE(0.25 OR 0.5MG/DOS) 2 MG/1.5ML ~~LOC~~ SOPN: 0.5000 mg | PEN_INJECTOR | SUBCUTANEOUS | Status: DC

## 2020-06-24 MED ORDER — DEXAMETHASONE SODIUM PHOSPHATE 10 MG/ML IJ SOLN
INTRAMUSCULAR | Status: AC
  Filled 2020-06-24: qty 1

## 2020-06-24 MED ORDER — LIDOCAINE 2% (20 MG/ML) 5 ML SYRINGE
INTRAMUSCULAR | Status: AC
  Filled 2020-06-24: qty 5

## 2020-06-24 MED ORDER — HYDROCODONE-ACETAMINOPHEN 5-325 MG PO TABS
1.0000 | ORAL_TABLET | ORAL | Status: DC | PRN
  Administered 2020-06-24: 1 via ORAL
  Administered 2020-06-24: 2 via ORAL
  Administered 2020-06-25: 1 via ORAL
  Filled 2020-06-24: qty 2
  Filled 2020-06-24 (×2): qty 1

## 2020-06-24 MED ORDER — MIDAZOLAM HCL 5 MG/5ML IJ SOLN
INTRAMUSCULAR | Status: DC | PRN
  Administered 2020-06-24: 2 mg via INTRAVENOUS

## 2020-06-24 MED ORDER — ONDANSETRON HCL 4 MG/2ML IJ SOLN
4.0000 mg | INTRAMUSCULAR | Status: DC | PRN
Start: 2020-06-24 — End: 2020-06-25

## 2020-06-24 MED ORDER — ONDANSETRON HCL 4 MG/2ML IJ SOLN
INTRAMUSCULAR | Status: AC
  Filled 2020-06-24: qty 2

## 2020-06-24 MED ORDER — LIDOCAINE-EPINEPHRINE 1 %-1:100000 IJ SOLN
INTRAMUSCULAR | Status: AC
  Filled 2020-06-24: qty 1

## 2020-06-24 MED ORDER — CHLORHEXIDINE GLUCONATE 0.12 % MT SOLN
OROMUCOSAL | Status: AC
  Administered 2020-06-24: 15 mL via OROMUCOSAL
  Filled 2020-06-24: qty 15

## 2020-06-24 MED ORDER — KCL IN DEXTROSE-NACL 20-5-0.45 MEQ/L-%-% IV SOLN
INTRAVENOUS | Status: DC
  Filled 2020-06-24 (×2): qty 1000

## 2020-06-24 MED ORDER — SUCCINYLCHOLINE CHLORIDE 200 MG/10ML IV SOSY
PREFILLED_SYRINGE | INTRAVENOUS | Status: AC
  Filled 2020-06-24: qty 10

## 2020-06-24 MED ORDER — ONDANSETRON HCL 4 MG/2ML IJ SOLN
INTRAMUSCULAR | Status: DC | PRN
  Administered 2020-06-24: 4 mg via INTRAVENOUS

## 2020-06-24 MED ORDER — ROCURONIUM BROMIDE 10 MG/ML (PF) SYRINGE
PREFILLED_SYRINGE | INTRAVENOUS | Status: AC
  Filled 2020-06-24: qty 10

## 2020-06-24 MED ORDER — MUPIROCIN 2 % EX OINT
TOPICAL_OINTMENT | CUTANEOUS | Status: AC
  Filled 2020-06-24: qty 22

## 2020-06-24 SURGICAL SUPPLY — 61 items
ATTRACTOMAT 16X20 MAGNETIC DRP (DRAPES) IMPLANT
BLADE CLIPPER SURG (BLADE) IMPLANT
BLADE SURG 12 STRL SS (BLADE) ×2 IMPLANT
BLADE SURG 15 STRL LF DISP TIS (BLADE) IMPLANT
BLADE SURG 15 STRL SS (BLADE)
BNDG GAUZE ELAST 4 BULKY (GAUZE/BANDAGES/DRESSINGS) IMPLANT
CANISTER SUCT 3000ML PPV (MISCELLANEOUS) ×2 IMPLANT
CLEANER TIP ELECTROSURG 2X2 (MISCELLANEOUS) ×2 IMPLANT
CNTNR URN SCR LID CUP LEK RST (MISCELLANEOUS) ×1 IMPLANT
CONT SPEC 4OZ STRL OR WHT (MISCELLANEOUS) ×2
CORD BIPOLAR FORCEPS 12FT (ELECTRODE) IMPLANT
COVER SURGICAL LIGHT HANDLE (MISCELLANEOUS) ×2 IMPLANT
COVER WAND RF STERILE (DRAPES) ×2 IMPLANT
DRAIN HEMOVAC 7FR (DRAIN) IMPLANT
DRAIN SNY 10 ROU (WOUND CARE) ×2 IMPLANT
DRAPE HALF SHEET 40X57 (DRAPES) IMPLANT
DRAPE SURG 17X11 SM STRL (DRAPES) ×2 IMPLANT
ELECT COATED BLADE 2.86 ST (ELECTRODE) ×2 IMPLANT
ELECT EMG 18 NIMS (NEUROSURGERY SUPPLIES)
ELECT REM PT RETURN 9FT ADLT (ELECTROSURGICAL) ×2
ELECTRODE EMG 18 NIMS (NEUROSURGERY SUPPLIES) IMPLANT
ELECTRODE REM PT RTRN 9FT ADLT (ELECTROSURGICAL) ×1 IMPLANT
EVACUATOR SILICONE 100CC (DRAIN) ×2 IMPLANT
FORCEPS BIPOLAR SPETZLER 8 1.0 (NEUROSURGERY SUPPLIES) IMPLANT
GLOVE SS BIOGEL STRL SZ 7.5 (GLOVE) ×1 IMPLANT
GLOVE SUPERSENSE BIOGEL SZ 7.5 (GLOVE) ×1
GLOVE SURG UNDER POLY LF SZ6 (GLOVE) ×1 IMPLANT
GOWN STRL REUS W/ TWL LRG LVL3 (GOWN DISPOSABLE) ×1 IMPLANT
GOWN STRL REUS W/ TWL XL LVL3 (GOWN DISPOSABLE) ×1 IMPLANT
GOWN STRL REUS W/TWL LRG LVL3 (GOWN DISPOSABLE) ×4
GOWN STRL REUS W/TWL XL LVL3 (GOWN DISPOSABLE) ×2
KIT BASIN OR (CUSTOM PROCEDURE TRAY) ×2 IMPLANT
KIT TURNOVER KIT B (KITS) ×2 IMPLANT
LOCATOR NERVE 3 VOLT (DISPOSABLE) ×2 IMPLANT
NDL HYPO 25GX1X1/2 BEV (NEEDLE) IMPLANT
NEEDLE HYPO 25GX1X1/2 BEV (NEEDLE) IMPLANT
NS IRRIG 1000ML POUR BTL (IV SOLUTION) ×2 IMPLANT
PAD ARMBOARD 7.5X6 YLW CONV (MISCELLANEOUS) ×4 IMPLANT
PENCIL BUTTON HOLSTER BLD 10FT (ELECTRODE) ×2 IMPLANT
PROBE NERVBE PRASS .33 (MISCELLANEOUS) IMPLANT
SPONGE INTESTINAL PEANUT (DISPOSABLE) ×2 IMPLANT
SPONGE LAP 18X18 RF (DISPOSABLE) ×2 IMPLANT
STAPLER VISISTAT 35W (STAPLE) ×2 IMPLANT
SUT CHROMIC 3 0 PS 2 (SUTURE) ×2 IMPLANT
SUT ETHILON 3 0 FSL (SUTURE) IMPLANT
SUT ETHILON 5 0 P 3 18 (SUTURE) ×2
SUT ETHILON 6 0 9-3 1X18 BLK (SUTURE) IMPLANT
SUT ETHILON 8 0 BV130 4 (SUTURE) IMPLANT
SUT NYLON ETHILON 5-0 P-3 1X18 (SUTURE) ×1 IMPLANT
SUT SILK 2 0 (SUTURE) ×2
SUT SILK 2 0 PERMA HAND 18 BK (SUTURE) ×4 IMPLANT
SUT SILK 2-0 18XBRD TIE 12 (SUTURE) ×1 IMPLANT
SUT SILK 3 0 (SUTURE) ×2
SUT SILK 3-0 18XBRD TIE 12 (SUTURE) ×1 IMPLANT
SUT SILK 4 0 (SUTURE)
SUT SILK 4-0 18XBRD TIE 12 (SUTURE) IMPLANT
SUT VIC AB 3-0 FS2 27 (SUTURE) IMPLANT
TOWEL GREEN STERILE FF (TOWEL DISPOSABLE) ×2 IMPLANT
TRAY ENT MC OR (CUSTOM PROCEDURE TRAY) ×2 IMPLANT
TRAY FOLEY W/BAG SLVR 14FR (SET/KITS/TRAYS/PACK) ×2 IMPLANT
WATER STERILE IRR 1000ML POUR (IV SOLUTION) IMPLANT

## 2020-06-24 NOTE — Op Note (Signed)
NAMEMELVILLE, ENGEN MEDICAL RECORD NO: [id number] ACCOUNT NO: [id number] DATE OF BIRTH: [birth year] FACILITY: [facility] LOCATION: [facility] PHYSICIAN: Leonides Sake. Lucia Gaskins, MD  Operative Report   DATE OF PROCEDURE: 06/24/2020  PREOPERATIVE DIAGNOSIS:  Left parotid gland mass or neoplasm.  POSTOPERATIVE DIAGNOSIS:  Left parotid gland mass or neoplasm.  OPERATION PERFORMED:  Left superficial parotidectomy with facial nerve dissection and removal of left parotid mass.  SURGEON:  Leonides Sake. Lucia Gaskins, MD  ANESTHESIA:  General endotracheal.  BLOOD LOSS:  50 mL.  COMPLICATIONS:  None.  BRIEF CLINICAL NOTE:  The patient is a 41 year old gentleman who has had a left parotid mass dating back to 2019 where he had a CT scan of his sinuses that showed a parotid mass.  He also underwent an ultrasound of the parotid mass in 08/2019 that showed  a 3.7 cm left parotid mass.  This has gradually enlarged and causing some symptoms to him.  He wanted to see about having it removed.  He has normal facial nerve function.  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  He is taken  to the operating room at this time for left parotidectomy and facial nerve dissection.  DESCRIPTION OF PROCEDURE:  After adequate endotracheal anesthesia, the patient received vancomycin IV preoperatively.  The proposed parotid incision was marked out and injected with Xylocaine with epinephrine for hemostasis.  The patient was prepped with  sterile solution and draped off sterile towels.  Standard parotid incision was then made.  Dissection was carried down through the subcutaneous tissue.  The mass was located inferiorly anteriorly in the anterior portion of the parotid gland.  Dissection  was carried out in the plane of the parotid fascia anteriorly to the distal end of the parotid mass.  Next, dissection was carried out posteriorly down in front of the ear toward the tragal cartilage down toward the facial  nerve trunk.  The facial nerve  trunk was then identified in its normal anatomical position just lateral and inferior to the styloid process.  The trunk was dissected out, and the mass was more on the inferior aspect of the facial nerve and the upper branches of the facial nerve after  reaching the bifurcation of the facial nerve were not dissected out any further and the remaining upper parotid gland was left in place.  Dissection was then carried out involving the lower branches of the facial nerve down to the parotid mass.  At the  distal end of the parotid mass, there was minimal parotid gland tissue around it, and dissection was carried right on top of the parotid mass that was encapsulated.  Care was taken to preserve the inferior branches of the facial nerve during dissection.   Hemostasis was obtained with bipolar cautery as well as some 3-0 silk ligatures.  The mass was removed and sent to pathology.  Wound was irrigated with saline and then closed with 3-0 chromic suture subcutaneously.  A #10-French JP drain was brought  through a stab incision and placed in the parotid bed.  The skin was reapproximated with 5-0 nylon suture.  The patient tolerated this well and was awoken from anesthesia and transferred to recovery room postop doing well.  DISPOSITION:  The patient will be observed overnight observation.  We will plan on removing the JP drain tomorrow morning and discharge home.  He will follow up in my office in 1 week for recheck and have sutures removed.   [facility] D: 06/24/2020 11:11:42 am T:  06/24/2020 6:26:00 pm  JOB: [id number]/ [id number]

## 2020-06-24 NOTE — Interval H&P Note (Signed)
History and Physical Interval Note:  06/24/2020 7:36 AM  [name]  has presented today for surgery, with the diagnosis of NEOPLASMA UNCERTAIN BEHAVIOR OF PAROTID GLAND.  The various methods of treatment have been discussed with the patient and family. After consideration of risks, benefits and other options for treatment, the patient has consented to  Procedure(s): [place] (Left) as a surgical intervention.  The patient's history has been reviewed, patient examined, no change in status, stable for surgery.  I have reviewed the patient's chart and labs.  Questions were answered to the patient's satisfaction.     Melony Overly

## 2020-06-24 NOTE — Anesthesia Procedure Notes (Signed)
Procedure Name: Intubation Date/Time: 06/24/2020 8:21 AM Performed by: Valda Favia, CRNA Pre-anesthesia Checklist: Patient identified, Emergency Drugs available, Suction available and Patient being monitored Patient Re-evaluated:Patient Re-evaluated prior to induction Oxygen Delivery Method: Circle System Utilized Preoxygenation: Pre-oxygenation with 100% oxygen Induction Type: IV induction Ventilation: Oral airway inserted - appropriate to patient size and Two handed mask ventilation required Laryngoscope Size: Mac and 4 Grade View: Grade II Tube type: Oral Tube size: 7.5 mm Number of attempts: 1 Airway Equipment and Method: Stylet and Oral airway Placement Confirmation: ETT inserted through vocal cords under direct vision,  positive ETCO2 and breath sounds checked- equal and bilateral Secured at: 23 cm Tube secured with: Tape Dental Injury: Teeth and Oropharynx as per pre-operative assessment

## 2020-06-24 NOTE — Progress Notes (Signed)
Postop check Afebrile vital signs stable. Patient doing well with minimal complaints.  Eating adequately. JP drain with minimal serosanguineous fluid. Normal facial nerve function. Stable postop course. We will plan on removing the JP drain in the morning and discharging patient home.

## 2020-06-24 NOTE — Transfer of Care (Signed)
Immediate Anesthesia Transfer of Care Note  Patient: Phillip Roach  Procedure(s) Performed: SUPERFICIAL PAROTIDECTOMY WITH FACIAL DISSECTION (Left Face)  Patient Location: PACU  Anesthesia Type:General  Level of Consciousness: drowsy  Airway & Oxygen Therapy: Patient Spontanous Breathing and Patient connected to face mask oxygen  Post-op Assessment: Report given to RN and Post -op Vital signs reviewed and stable  Post vital signs: Reviewed and stable  Last Vitals:  Vitals Value Taken Time  BP 124/63 06/24/20 1116  Temp 36.9 C 06/24/20 1115  Pulse 97 06/24/20 1121  Resp 17 06/24/20 1121  SpO2 94 % 06/24/20 1121  Vitals shown include unvalidated device data.  Last Pain:  Vitals:   06/24/20 0652  PainSc: 0-No pain         Complications: No complications documented.

## 2020-06-24 NOTE — Brief Op Note (Signed)
06/24/2020  11:05 AM  PATIENT:  Phillip Roach  40 y.o. male  PRE-OPERATIVE DIAGNOSIS:  NEOPLASMA UNCERTAIN BEHAVIOR OF PAROTID GLAND  POST-OPERATIVE DIAGNOSIS:  NEOPLASMA UNCERTAIN BEHAVIOR OF PAROTID GLAND  PROCEDURE:  Procedure(s): SUPERFICIAL PAROTIDECTOMY WITH FACIAL DISSECTION (Left)  SURGEON:  Surgeon(s) and Role:    Rozetta Nunnery, MD - Primary  PHYSICIAN ASSISTANT:   ASSISTANTS: none   ANESTHESIA:   general  EBL:  50 mL   BLOOD ADMINISTERED:none  DRAINS: (10) Jackson-Pratt drain(s) with closed bulb suction in the left parotid bed   LOCAL MEDICATIONS USED:  XYLOCAINE   SPECIMEN:  Source of Specimen:  left parotid mass  DISPOSITION OF SPECIMEN:  PATHOLOGY  COUNTS:  YES  TOURNIQUET:  * No tourniquets in log *  DICTATION: .Other Dictation: Dictation Number [id number]  PLAN OF CARE: Admit for overnight observation  PATIENT DISPOSITION:  PACU - hemodynamically stable.   Delay start of Pharmacological VTE agent (>24hrs) due to surgical blood loss or risk of bleeding: yes

## 2020-06-24 NOTE — Anesthesia Postprocedure Evaluation (Signed)
Anesthesia Post Note  Patient: KHALEED HOLAN  Procedure(s) Performed: SUPERFICIAL PAROTIDECTOMY WITH FACIAL DISSECTION (Left Face)     Patient location during evaluation: PACU Anesthesia Type: General Level of consciousness: awake and alert Pain management: pain level controlled Vital Signs Assessment: post-procedure vital signs reviewed and stable Respiratory status: spontaneous breathing, nonlabored ventilation, respiratory function stable and patient connected to nasal cannula oxygen Cardiovascular status: blood pressure returned to baseline and stable Postop Assessment: no apparent nausea or vomiting Anesthetic complications: no   No complications documented.  Last Vitals:  Vitals:   06/24/20 1242 06/24/20 1505  BP: 130/77 (!) 143/82  Pulse:  95  Resp: 16 20  Temp:  36.6 C  SpO2: 93% 97%    Last Pain:  Vitals:   06/24/20 1505  TempSrc: Oral  PainSc:                  Tiajuana Amass

## 2020-06-25 ENCOUNTER — Encounter (HOSPITAL_COMMUNITY): Payer: Self-pay | Admitting: Otolaryngology

## 2020-06-25 DIAGNOSIS — F329 Major depressive disorder, single episode, unspecified: Secondary | ICD-10-CM | POA: Diagnosis not present

## 2020-06-25 DIAGNOSIS — Z79899 Other long term (current) drug therapy: Secondary | ICD-10-CM | POA: Diagnosis not present

## 2020-06-25 DIAGNOSIS — K219 Gastro-esophageal reflux disease without esophagitis: Secondary | ICD-10-CM | POA: Diagnosis not present

## 2020-06-25 DIAGNOSIS — J45909 Unspecified asthma, uncomplicated: Secondary | ICD-10-CM | POA: Diagnosis not present

## 2020-06-25 DIAGNOSIS — Z20822 Contact with and (suspected) exposure to covid-19: Secondary | ICD-10-CM | POA: Diagnosis not present

## 2020-06-25 DIAGNOSIS — I10 Essential (primary) hypertension: Secondary | ICD-10-CM | POA: Diagnosis not present

## 2020-06-25 DIAGNOSIS — D110 Benign neoplasm of parotid gland: Secondary | ICD-10-CM | POA: Diagnosis not present

## 2020-06-25 LAB — SURGICAL PATHOLOGY

## 2020-06-25 NOTE — Discharge Instructions (Signed)
Leave the dressing in place for the next 24 hours.  May then remove the dressing and get the incision site wet. Apply antibiotic ointment to the incision site daily. Take Tylenol or ibuprofen as needed pain. Take your regular medications. Call office if you have any questions or problems [phone] Return to see Dr. Lucia Gaskins next Monday at 4:30

## 2020-06-25 NOTE — Progress Notes (Signed)
Postop day 1 Afebrile vital signs stable. JP drain with minimal serous effusion.  This was removed. Incision site is healing nicely with minimal swelling. Patient is discharged home and will follow up in the office next Monday at 430.  Discharge dictated # [id number]

## 2020-06-25 NOTE — Plan of Care (Signed)
  Problem: Education: Goal: Knowledge of General Education information will improve Description: Including pain rating scale, medication(s)/side effects and non-pharmacologic comfort measures 06/25/2020 0912 by Camillia Herter, RN Outcome: Adequate for Discharge 06/25/2020 0911 by Camillia Herter, RN Outcome: Progressing   Problem: Health Behavior/Discharge Planning: Goal: Ability to manage health-related needs will improve 06/25/2020 0912 by Camillia Herter, RN Outcome: Adequate for Discharge 06/25/2020 0911 by Camillia Herter, RN Outcome: Progressing   Problem: Clinical Measurements: Goal: Ability to maintain clinical measurements within normal limits will improve 06/25/2020 0912 by Camillia Herter, RN Outcome: Adequate for Discharge 06/25/2020 0911 by Camillia Herter, RN Outcome: Progressing Goal: Will remain free from infection 06/25/2020 0912 by Camillia Herter, RN Outcome: Adequate for Discharge 06/25/2020 0911 by Camillia Herter, RN Outcome: Progressing Goal: Diagnostic test results will improve 06/25/2020 0912 by Camillia Herter, RN Outcome: Adequate for Discharge 06/25/2020 0911 by Camillia Herter, RN Outcome: Progressing Goal: Respiratory complications will improve 06/25/2020 0912 by Camillia Herter, RN Outcome: Adequate for Discharge 06/25/2020 0911 by Camillia Herter, RN Outcome: Progressing Goal: Cardiovascular complication will be avoided 06/25/2020 0912 by Camillia Herter, RN Outcome: Adequate for Discharge 06/25/2020 0911 by Camillia Herter, RN Outcome: Progressing   Problem: Activity: Goal: Risk for activity intolerance will decrease 06/25/2020 0912 by Camillia Herter, RN Outcome: Adequate for Discharge 06/25/2020 0911 by Camillia Herter, RN Outcome: Progressing   Problem: Nutrition: Goal: Adequate nutrition will be maintained 06/25/2020 0912 by Camillia Herter, RN Outcome: Adequate for Discharge 06/25/2020 0911 by Camillia Herter, RN Outcome:  Progressing   Problem: Coping: Goal: Level of anxiety will decrease 06/25/2020 0912 by Camillia Herter, RN Outcome: Adequate for Discharge 06/25/2020 0911 by Camillia Herter, RN Outcome: Progressing   Problem: Elimination: Goal: Will not experience complications related to bowel motility 06/25/2020 0912 by Camillia Herter, RN Outcome: Adequate for Discharge 06/25/2020 0911 by Camillia Herter, RN Outcome: Progressing Goal: Will not experience complications related to urinary retention 06/25/2020 0912 by Camillia Herter, RN Outcome: Adequate for Discharge 06/25/2020 0911 by Camillia Herter, RN Outcome: Progressing   Problem: Pain Managment: Goal: General experience of comfort will improve 06/25/2020 0912 by Camillia Herter, RN Outcome: Adequate for Discharge 06/25/2020 0911 by Camillia Herter, RN Outcome: Progressing   Problem: Safety: Goal: Ability to remain free from injury will improve 06/25/2020 0912 by Camillia Herter, RN Outcome: Adequate for Discharge 06/25/2020 0911 by Camillia Herter, RN Outcome: Progressing   Problem: Skin Integrity: Goal: Risk for impaired skin integrity will decrease 06/25/2020 0912 by Camillia Herter, RN Outcome: Adequate for Discharge 06/25/2020 0911 by Camillia Herter, RN Outcome: Progressing

## 2020-06-25 NOTE — Plan of Care (Signed)

## 2020-06-26 NOTE — Discharge Summary (Signed)
Phillip Roach, Phillip Roach MEDICAL RECORD NO: [id number] ACCOUNT NO: [id number] DATE OF BIRTH: 11-11-1980 FACILITY: [facility] LOCATION: [facility] PHYSICIAN: Leonides Sake. Lucia Gaskins, MD  Discharge Summary   DATE OF DISCHARGE: 06/25/2020  DIAGNOSIS:  Left parotid tumor.  OPERATIONS:  During this hospitalization is left superficial parotidectomy with facial nerve dissection on 06/24/2020.  HOSPITAL COURSE:  The patient was admitted via the operating room on 06/24/2020, at which time he underwent a left superficial parotidectomy for a large left parotid tumor.  He tolerated this well with normal facial nerve function.  He received  perioperative antibiotic, vancomycin IV as HE IS ALLERGIC TO CEPHALOSPORINS.  He had a JP drain that had minimal discharge in the morning on the first postoperative day and this was removed.  The wound was healing nicely with no significant swelling and  he had normal facial nerve function postoperatively.  The patient was subsequently discharged home on his first postoperative day, 06/25/2020.  DISPOSITION:  The patient is doing extremely well.  He will follow up in my office in 6 days for recheck and have sutures removed.  He was given Tylenol and ibuprofen p.r.n. pain and will take his regular medications.   SHW D: 06/25/2020 7:19:41 am T: 06/26/2020 1:54:00 am  JOB: [id number]/ [id number]

## 2020-06-27 DIAGNOSIS — F329 Major depressive disorder, single episode, unspecified: Secondary | ICD-10-CM | POA: Diagnosis not present

## 2020-06-27 NOTE — Telephone Encounter (Signed)
PA approved patient aware

## 2020-06-30 ENCOUNTER — Ambulatory Visit (INDEPENDENT_AMBULATORY_CARE_PROVIDER_SITE_OTHER): Payer: 59 | Admitting: Otolaryngology

## 2020-06-30 ENCOUNTER — Telehealth (INDEPENDENT_AMBULATORY_CARE_PROVIDER_SITE_OTHER): Payer: 59 | Admitting: Psychology

## 2020-06-30 ENCOUNTER — Other Ambulatory Visit: Payer: Self-pay

## 2020-06-30 ENCOUNTER — Ambulatory Visit (INDEPENDENT_AMBULATORY_CARE_PROVIDER_SITE_OTHER): Payer: 59 | Admitting: Family Medicine

## 2020-06-30 DIAGNOSIS — F3289 Other specified depressive episodes: Secondary | ICD-10-CM | POA: Diagnosis not present

## 2020-06-30 DIAGNOSIS — Z4889 Encounter for other specified surgical aftercare: Secondary | ICD-10-CM

## 2020-06-30 NOTE — Progress Notes (Signed)
HPI: Phillip Roach is a 40 y.o. male who presents 6 days s/p left superficial parotidectomy with facial nerve dissection.  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  Patient is doing well..   Past Medical History:  Diagnosis Date  . ADHD   . ADHD (attention deficit hyperactivity disorder)   . Asthma    as a child, no inhaler - exercise and seasonal allergies induced  . Back pain   . Depression   . Fatigue   . GERD (gastroesophageal reflux disease)    in past , no current problems  . Heartburn   . Hypertension    in past = no current problems, no meds  . Joint pain   . Lactose intolerance   . Overweight   . Pre-diabetes   . Sleep apnea    does not use cpap  . Vitamin D deficiency    Past Surgical History:  Procedure Laterality Date  . FOREHEAD RECONSTRUCTION Right    Right Temple Shrapnel Removed  . PAROTIDECTOMY Left 06/24/2020   Procedure: SUPERFICIAL PAROTIDECTOMY WITH FACIAL DISSECTION;  Surgeon: Rozetta Nunnery, MD;  Location: [facility];  Service: ENT;  Laterality: Left;  . PILONIDAL CYST DRAINAGE    . WISDOM TOOTH EXTRACTION     Social History   Socioeconomic History  . Marital status: Married    Spouse name: Not on file  . Number of children: Not on file  . Years of education: Not on file  . Highest education level: Not on file  Occupational History  . Occupation: Paramedic  Tobacco Use  . Smoking status: Never Smoker  . Smokeless tobacco: Never Used  Vaping Use  . Vaping Use: Never used  Substance and Sexual Activity  . Alcohol use: Yes    Comment: couple times a month  . Drug use: No  . Sexual activity: Yes  Other Topics Concern  . Not on file  Social History Narrative   Married and has baby on the way    Works for [employer] to [employer]   Social Determinants of Radio broadcast assistant Strain: Not on [employer] Insecurity: Not on file  Transportation Needs: Not on file  Physical Activity: Not on file  Stress:  Not on file  Social Connections: Not on file   Family History  Problem Relation Age of Onset  . GER disease Mother   . Depression Mother   . Alcohol abuse Mother   . Obesity Mother   . Anxiety disorder Father   . Hypertension Father   . Sleep apnea Father   . Obesity Father    Allergies  Allergen Reactions  . Shellfish Allergy Anaphylaxis    Iodine ok per pt  . Keflex [Cephalexin] Rash   Prior to Admission medications   Medication Sig Start Date End Date Taking? Authorizing Provider  acetaminophen (TYLENOL) 500 MG tablet Take 1 tablet (500 mg total) by mouth every 6 (six) hours as needed. Patient taking differently: Take 1,000 mg by mouth every 6 (six) hours as needed for moderate pain or headache. 07/16/19   Lamptey, Myrene Galas, MD  b complex vitamins tablet Take 2 tablets by mouth daily.    [provider]  buPROPion (WELLBUTRIN XL) 300 MG 24 hr tablet Take 1 tablet (300 mg total) by mouth daily. 08/14/19   [name], MD  ibuprofen (ADVIL,MOTRIN) 200 MG tablet Take 800 mg by mouth every 6 (six) hours as needed  for pain.    [provider]  loratadine (CLARITIN) 10 MG tablet Take 10 mg by mouth daily.    [provider]  Semaglutide,0.25 or 0.5MG /DOS, 2 MG/1.5ML SOPN Inject 0.5 mg into the skin once a week. Patient taking differently: Inject 0.5 mg into the skin every Monday. 06/09/20 06/09/21  Laqueta Linden, MD  testosterone cypionate (DEPOTESTOSTERONE CYPIONATE) 200 MG/ML injection INJECT 1 ML (200 MG TOTAL) INTO THE MUSCLE EVERY 14 (FOURTEEN) DAYS. 06/06/20 12/03/20  [name], MD  triamcinolone (NASACORT) 55 MCG/ACT AERO nasal inhaler Place 1 spray into the nose daily.    [provider]  Vitamin D, Ergocalciferol, (DRISDOL) 1.25 MG (50000 UNIT) CAPS capsule TAKE 1 CAPSULE BY MOUTH EVERY 7 DAYS Patient taking differently: Take 50,000 Units by mouth every Monday. 06/09/20 06/09/21  Laqueta Linden, MD     Physical  Exam: Patient has normal facial nerve function.  No significant swelling from the left superficial parotidectomy.  Minimal erythema involving the incision site and sutures.  The sutures were removed in the office today.   Assessment: S/p left superficial parotidectomy for 4 cm pleomorphic adenoma.  Plan: Sutures were removed in the office today.  I reviewed the pathology report with the patient. Recommend continued use of the antibiotic ointment for another 24 hours. He will follow-up as needed.   Radene Journey, MD

## 2020-07-01 ENCOUNTER — Telehealth: Payer: Self-pay

## 2020-07-01 ENCOUNTER — Other Ambulatory Visit (HOSPITAL_BASED_OUTPATIENT_CLINIC_OR_DEPARTMENT_OTHER): Payer: Self-pay

## 2020-07-01 DIAGNOSIS — Z3009 Encounter for other general counseling and advice on contraception: Secondary | ICD-10-CM | POA: Diagnosis not present

## 2020-07-01 MED ORDER — DIAZEPAM 10 MG PO TABS
ORAL_TABLET | ORAL | 0 refills | Status: DC
  Filled 2020-07-01: qty 2, 2d supply, fill #0

## 2020-07-01 MED FILL — Testosterone Cypionate IM Inj in Oil 200 MG/ML: INTRAMUSCULAR | 84 days supply | Qty: 6 | Fill #0 | Status: AC

## 2020-07-01 NOTE — Telephone Encounter (Signed)
Called pharmacy to see if Rx could be prepared for patient.

## 2020-07-01 NOTE — Telephone Encounter (Signed)
Pt called and just got notification that his prescription for testosterone cypionate (DEPOTESTOSTERONE CYPIONATE) 200 MG/ML  Was approved by his insurance.   He is asking if that can be sent to [place].   Thanks

## 2020-07-10 ENCOUNTER — Other Ambulatory Visit (HOSPITAL_BASED_OUTPATIENT_CLINIC_OR_DEPARTMENT_OTHER): Payer: Self-pay

## 2020-07-10 ENCOUNTER — Other Ambulatory Visit (INDEPENDENT_AMBULATORY_CARE_PROVIDER_SITE_OTHER): Payer: Self-pay | Admitting: Family Medicine

## 2020-07-10 DIAGNOSIS — R7303 Prediabetes: Secondary | ICD-10-CM

## 2020-07-10 DIAGNOSIS — E559 Vitamin D deficiency, unspecified: Secondary | ICD-10-CM

## 2020-07-10 NOTE — Telephone Encounter (Signed)
Dr.Ukleja 

## 2020-07-11 ENCOUNTER — Other Ambulatory Visit (HOSPITAL_BASED_OUTPATIENT_CLINIC_OR_DEPARTMENT_OTHER): Payer: Self-pay

## 2020-07-14 ENCOUNTER — Other Ambulatory Visit (HOSPITAL_BASED_OUTPATIENT_CLINIC_OR_DEPARTMENT_OTHER): Payer: Self-pay

## 2020-07-14 NOTE — Progress Notes (Signed)
Office: [phone]  /  Fax: [phone]    Date: Jul 28, 2020   Appointment Start Time: 10:03am Duration: 25 minutes Provider: Glennie Roach, Psy.D. Type of Session: Individual Therapy  Location of Patient: Phillip Roach in [place] (private location) Location of Provider: Provider's Home (private office) Type of Contact: Telepsychological Visit via MyChart Video Visit  Session Content: This provider called Phillip Roach at 10:02am as he did not present for the telepsychological appointment. It appears Phillip Roach forgot his appointment; however, stated he could join. As such, today's appointment was initiated 3 minutes late.  Phillip Roach is a 40 y.o. male presenting for a follow-up appointment to address the previously established treatment goal of increasing coping skills. Today's appointment was a telepsychological visit due to COVID-19. Phillip Roach provided verbal consent for today's telepsychological appointment and he is aware he is responsible for securing confidentiality on his end of the session. Prior to proceeding with today's appointment, Phillip Roach's physical location at the time of this appointment was obtained as well a phone number he could be reached at in the event of technical difficulties. Phillip Roach and this provider participated in today's telepsychological service.   This provider conducted a brief check-in. Phillip Roach stated he is "trying to stay busy" and shared about recent events, including family time. Regarding weight loss, he stated he continues to lose weight; however, described experiencing disappointment with recent weight loss. This was further explored and processed. He acknowledged increased eating out likely contributed to the slowed weight loss. Session also focused on discussing other changes/successes to date outside of the number on the scale since starting with the clinic and changing eating habits: increased energy; improved sleep; and improved mood. Positive reinforcement was provided.    Session focused further on mindfulness to assist with coping. He discussed increased awareness of finishing the food on his children's plates lately. Thus, this provider shared about a mindfulness exercise (urge surfing) that can be helpful. Phillip Roach provided verbal consent during today's appointment for this provider to send the handout for the aforementioned exercise via e-mail. Phillip Roach was receptive to today's appointment as evidenced by openness to sharing, responsiveness to feedback, and willingness to continue engaging in mindfulness exercises.  Mental Status Examination:  Appearance: well groomed and appropriate hygiene  Behavior: appropriate to circumstances Mood: euthymic Affect: mood congruent Speech: normal in rate, volume, and tone Eye Contact: appropriate Psychomotor Activity: unable to assess  Gait: unable to assess Thought Process: linear, logical, and goal directed  Thought Content/Perception: no hallucinations, delusions, bizarre thinking or behavior reported or observed and no evidence or endorsement of suicidal and homicidal ideation, plan, and intent Orientation: time, person, place, and purpose of appointment Memory/Concentration: memory, attention, language, and fund of knowledge intact  Insight/Judgment: good  Interventions:  Conducted a brief chart review Provided empathic reflections and validation Provided positive reinforcement Employed supportive psychotherapy interventions to facilitate reduced distress and to improve coping skills with identified stressors Discussed mindfulness   DSM-5 Diagnosis(es): F32.89 Other Specified Depressive Disorder, Emotional Eating Behaviors  Treatment Goal & Progress: During the initial appointment with this provider, the following treatment goal was established: increase coping skills. Phillip Roach has demonstrated progress in his goal as evidenced by increased awareness of hunger patterns, increased awareness of triggers for emotional  eating and reduction in emotional eating. Phillip Roach also continues to demonstrate willingness to engage in learned skill(s).  Plan: The next appointment will be scheduled in one month, which will be via MyChart Video Visit. The next session will focus on working towards the established  treatment goal and termination.

## 2020-07-16 ENCOUNTER — Other Ambulatory Visit (HOSPITAL_BASED_OUTPATIENT_CLINIC_OR_DEPARTMENT_OTHER): Payer: Self-pay

## 2020-07-17 ENCOUNTER — Other Ambulatory Visit: Payer: Self-pay

## 2020-07-17 ENCOUNTER — Ambulatory Visit (INDEPENDENT_AMBULATORY_CARE_PROVIDER_SITE_OTHER): Payer: 59 | Admitting: Family Medicine

## 2020-07-17 ENCOUNTER — Other Ambulatory Visit (HOSPITAL_BASED_OUTPATIENT_CLINIC_OR_DEPARTMENT_OTHER): Payer: Self-pay

## 2020-07-17 ENCOUNTER — Encounter (INDEPENDENT_AMBULATORY_CARE_PROVIDER_SITE_OTHER): Payer: Self-pay | Admitting: Family Medicine

## 2020-07-17 VITALS — BP 121/74 | HR 74 | Temp 97.6°F | Ht 74.0 in | Wt 305.0 lb

## 2020-07-17 DIAGNOSIS — Z9189 Other specified personal risk factors, not elsewhere classified: Secondary | ICD-10-CM

## 2020-07-17 DIAGNOSIS — Z6841 Body Mass Index (BMI) 40.0 and over, adult: Secondary | ICD-10-CM | POA: Diagnosis not present

## 2020-07-17 DIAGNOSIS — E559 Vitamin D deficiency, unspecified: Secondary | ICD-10-CM

## 2020-07-17 DIAGNOSIS — R7303 Prediabetes: Secondary | ICD-10-CM

## 2020-07-17 MED ORDER — VITAMIN D (ERGOCALCIFEROL) 1.25 MG (50000 UNIT) PO CAPS
ORAL_CAPSULE | ORAL | 0 refills | Status: DC
  Filled 2020-07-17: qty 4, 28d supply, fill #0

## 2020-07-17 MED ORDER — SEMAGLUTIDE(0.25 OR 0.5MG/DOS) 2 MG/1.5ML ~~LOC~~ SOPN
0.5000 mg | PEN_INJECTOR | SUBCUTANEOUS | 0 refills | Status: DC
  Filled 2020-07-17: qty 1.5, 28d supply, fill #0

## 2020-07-21 ENCOUNTER — Encounter (INDEPENDENT_AMBULATORY_CARE_PROVIDER_SITE_OTHER): Payer: Self-pay

## 2020-07-21 NOTE — Progress Notes (Signed)
Chief Complaint:   OBESITY Phillip Roach is here to discuss his progress with his obesity treatment plan along with follow-up of his obesity related diagnoses. Phillip Roach is on the Category 4 Plan and states he is following his eating plan approximately 80% of the time. Phillip Roach states he is doing daily activities.  Today's visit was #: 62 Starting weight: 325 lbs Starting date: 09/24/2019 Today's weight: 305 lbs Today's date: 07/17/2020 Total lbs lost to date: 20 Total lbs lost since last in-office visit: 6  Interim History: Phillip Roach is doing well on Ozempic. He has minimal appetite on the day he takes it, which he has to remind himself to eat. He has been trying to follow plan as strictly as he can. He has completely cut alcohol out. Phillip Roach is having some home stress but is managing it as best as he can.  Subjective:   1. Pre-diabetes Marik's last A1c 5.7 and insulin level 9.7. He reports minimal GI side effects of Ozempic.  2. Vitamin D deficiency Bentleigh denies nausea, vomiting, and muscle weakness but notes fatigue. Phillip Roach is on prescription Vit D.  3. At risk for osteoporosis Orley is at higher risk of osteopenia and osteoporosis due to Vitamin D deficiency.   Assessment/Plan:   1. Pre-diabetes Phillip Roach will continue to work on weight loss, exercise, and decreasing simple carbohydrates to help decrease the risk of diabetes.   - Semaglutide,0.25 or 0.5MG /DOS, 2 MG/1.5ML SOPN; Inject 0.5 mg into the skin once a week.  Dispense: 1.5 mL; Refill: 0  2. Vitamin D deficiency Low Vitamin D level contributes to fatigue and are associated with obesity, breast, and colon cancer. He agrees to continue to take prescription Vitamin D @50 ,000 IU every week and will follow-up for routine testing of Vitamin D, at least 2-3 times per year to avoid over-replacement.  - Vitamin D, Ergocalciferol, (DRISDOL) 1.25 MG (50000 UNIT) CAPS capsule; TAKE 1 CAPSULE BY MOUTH EVERY 7 DAYS  Dispense: 4 capsule; Refill: 0  3. At  risk for osteoporosis Armas was given approximately 15 minutes of osteoporosis prevention counseling today. Phillip Roach is at risk for osteopenia and osteoporosis due to his Vitamin D deficiency. He was encouraged to take his Vitamin D and follow his higher calcium diet and increase strengthening exercise to help strengthen his bones and decrease his risk of osteopenia and osteoporosis.  Repetitive spaced learning was employed today to elicit superior memory formation and behavioral change.  4. Class 3 severe obesity with serious comorbidity and body mass index (BMI) of 40.0 to 44.9 in adult, unspecified obesity type [facility])  Phillip Roach is currently in the action stage of change. As such, his goal is to continue with weight loss efforts. He has agreed to the Category 4 Plan.   Exercise goals: As is  Behavioral modification strategies: increasing lean protein intake, meal planning and cooking strategies, keeping healthy foods in the home and planning for success.  Phillip Roach has agreed to follow-up with our clinic in 3 weeks. He was informed of the importance of frequent follow-up visits to maximize his success with intensive lifestyle modifications for his multiple health conditions.   Objective:   Blood pressure 121/74, pulse 74, temperature 97.6 F (36.4 C), height 6\' 2"  (1.88 m), weight (!) 305 lb (138.3 kg), SpO2 98 %. Body mass index is 39.16 kg/m.  General: Cooperative, alert, well developed, in no acute distress. HEENT: Conjunctivae and lids unremarkable. Cardiovascular: Regular rhythm.  Lungs: Normal work of breathing. Neurologic: No focal deficits.  Lab Results  Component Value Date   CREATININE 0.85 06/24/2020   BUN 10 06/24/2020   NA 138 06/24/2020   K 4.3 06/24/2020   CL 105 06/24/2020   CO2 25 06/24/2020   Lab Results  Component Value Date   ALT 23 05/15/2020   AST 14 05/15/2020   ALKPHOS 50 05/15/2020   BILITOT 0.5 05/15/2020   Lab Results  Component Value Date   HGBA1C 5.7  (H) 02/25/2020   HGBA1C 5.7 (H) 09/24/2019   Lab Results  Component Value Date   INSULIN 9.7 02/25/2020   INSULIN 17.3 09/24/2019   Lab Results  Component Value Date   TSH 0.775 07/16/2019   Lab Results  Component Value Date   CHOL 177 02/25/2020   HDL 33 (L) 02/25/2020   LDLCALC 116 (H) 02/25/2020   LDLDIRECT 85.4 07/28/2007   TRIG 159 (H) 02/25/2020   CHOLHDL 5 06/27/2012   Lab Results  Component Value Date   WBC 8.9 06/24/2020   HGB 15.1 06/24/2020   HCT 44.6 06/24/2020   MCV 89.6 06/24/2020   PLT 241 06/24/2020    Attestation Statements:   Reviewed by clinician on day of visit: allergies, medications, problem list, medical history, surgical history, family history, social history, and previous encounter notes.  Coral Ceo, CMA, am acting as transcriptionist for Coralie Common, MD.   I have reviewed the above documentation for accuracy and completeness, and I agree with the above. - Jinny Blossom, MD

## 2020-07-23 DIAGNOSIS — F329 Major depressive disorder, single episode, unspecified: Secondary | ICD-10-CM | POA: Diagnosis not present

## 2020-07-28 ENCOUNTER — Telehealth (INDEPENDENT_AMBULATORY_CARE_PROVIDER_SITE_OTHER): Payer: 59 | Admitting: Psychology

## 2020-07-28 DIAGNOSIS — F3289 Other specified depressive episodes: Secondary | ICD-10-CM

## 2020-08-06 ENCOUNTER — Encounter (INDEPENDENT_AMBULATORY_CARE_PROVIDER_SITE_OTHER): Payer: Self-pay | Admitting: Family Medicine

## 2020-08-06 ENCOUNTER — Other Ambulatory Visit (HOSPITAL_BASED_OUTPATIENT_CLINIC_OR_DEPARTMENT_OTHER): Payer: Self-pay

## 2020-08-06 ENCOUNTER — Other Ambulatory Visit (INDEPENDENT_AMBULATORY_CARE_PROVIDER_SITE_OTHER): Payer: Self-pay

## 2020-08-06 DIAGNOSIS — R7303 Prediabetes: Secondary | ICD-10-CM

## 2020-08-06 MED ORDER — SEMAGLUTIDE(0.25 OR 0.5MG/DOS) 2 MG/1.5ML ~~LOC~~ SOPN
0.5000 mg | PEN_INJECTOR | SUBCUTANEOUS | 0 refills | Status: DC
Start: 2020-08-06 — End: 2020-08-25
  Filled 2020-08-06 – 2020-08-14 (×2): qty 1.5, 28d supply, fill #0

## 2020-08-06 NOTE — Telephone Encounter (Signed)
Dr.Ukleja 

## 2020-08-11 NOTE — Progress Notes (Signed)
  Office: [phone]  /  Fax: [phone]    Date: August 25, 2020   Appointment Start Time: 10:00am Duration: 32 minutes Provider: Glennie Roach, Psy.D. Type of Session: Individual Therapy  Location of Patient: Home Location of Provider: Provider's home (private office) Type of Contact: Telepsychological Visit via MyChart Video Visit  Session Content: Phillip Roach is a 40 y.o. male presenting for a follow-up appointment to address the previously established treatment goal of increasing coping skills. Today's appointment was a telepsychological visit due to COVID-19. Phillip Roach provided verbal consent for today's telepsychological appointment and he is aware he is responsible for securing confidentiality on his end of the session. Prior to proceeding with today's appointment, Phillip Roach's physical location at the time of this appointment was obtained as well a phone number he could be reached at in the event of technical difficulties. Phillip Roach and this provider participated in today's telepsychological service.   This provider conducted a brief check-in. Phillip Roach shared about his recent vacation. He stated his Ozempic dose will be increased after today's appointment with Dr. Jearld Roach. Phillip Roach continues to report a reduction in emotional eating behaviors despite ongoing stressors (e.g., hospital bill). Associated thoughts and feelings were briefly processed. He was encouraged to speak with [facility] billing department further; he agreed. He expressed concern about being "scammed," adding he has thinking about hiring a Chief Executive Officer. To assist with coping with feeling frustrated, reviewed previously discussed exercise (5-4-3-2-1). He expressed willingness to try engaging in learned skills to assist with coping with this stressor. Furthermore, Phillip Roach discussed marital concerns. He was encouraged to discuss this further in marital counseling; he agreed. Due to his ongoing frustration with different stressors, this provider recommended  increasing frequency of appointments with his primary therapist. He was agreeable. Overall, Phillip Roach was receptive to today's appointment as evidenced by openness to sharing, responsiveness to feedback, and willingness to continue engaging in learned skills.  Mental Status Examination:  Appearance: well groomed and appropriate hygiene  Behavior: appropriate to circumstances Mood: frustrated Affect: mood congruent Speech: normal in rate, volume, and tone Eye Contact: appropriate Psychomotor Activity: unable to assess  Gait: unable to assess Thought Process: linear, logical, and goal directed  Thought Content/Perception: no hallucinations, delusions, bizarre thinking or behavior reported or observed and denies suicidal and homicidal ideation, plan, and intent Orientation: time, person, place, and purpose of appointment Memory/Concentration: memory, attention, language, and fund of knowledge intact  Insight/Judgment: good  Interventions:  Conducted a brief chart review Provided empathic reflections and validation Employed supportive psychotherapy interventions to facilitate reduced distress and to improve coping skills with identified stressors Reviewed learned skills  DSM-5 Diagnosis(es): F32.89 Other Specified Depressive Disorder, Emotional Eating Behaviors  Treatment Goal & Progress: During the initial appointment with this provider, the following treatment goal was established: increase coping skills. Phillip Roach has demonstrated progress in his goal as evidenced by increased awareness of hunger patterns, increased awareness of triggers for emotional eating behaviors, and reduction in emotional eating behaviors . Phillip Roach also continues to demonstrate willingness to engage in learned skill(s).  Plan: Due to ongoing stressors, this provider recommended an additional follow-up appointment. He agreed; therefore, the next appointment will be scheduled in three weeks, which will be via [place]  Visit. The next session will focus on working towards the established treatment goal and termination. Additionally, Phillip Roach stated he is scheduled with his primary therapist "later this week or next week."

## 2020-08-13 ENCOUNTER — Other Ambulatory Visit (HOSPITAL_BASED_OUTPATIENT_CLINIC_OR_DEPARTMENT_OTHER): Payer: Self-pay

## 2020-08-13 ENCOUNTER — Ambulatory Visit (INDEPENDENT_AMBULATORY_CARE_PROVIDER_SITE_OTHER): Payer: 59 | Admitting: Family Medicine

## 2020-08-14 ENCOUNTER — Other Ambulatory Visit (HOSPITAL_BASED_OUTPATIENT_CLINIC_OR_DEPARTMENT_OTHER): Payer: Self-pay

## 2020-08-25 ENCOUNTER — Other Ambulatory Visit (HOSPITAL_BASED_OUTPATIENT_CLINIC_OR_DEPARTMENT_OTHER): Payer: Self-pay

## 2020-08-25 ENCOUNTER — Ambulatory Visit (INDEPENDENT_AMBULATORY_CARE_PROVIDER_SITE_OTHER): Payer: 59 | Admitting: Family Medicine

## 2020-08-25 ENCOUNTER — Telehealth (INDEPENDENT_AMBULATORY_CARE_PROVIDER_SITE_OTHER): Payer: 59 | Admitting: Psychology

## 2020-08-25 ENCOUNTER — Other Ambulatory Visit: Payer: Self-pay

## 2020-08-25 ENCOUNTER — Encounter (INDEPENDENT_AMBULATORY_CARE_PROVIDER_SITE_OTHER): Payer: Self-pay | Admitting: Family Medicine

## 2020-08-25 VITALS — BP 117/74 | HR 76 | Temp 97.5°F | Ht 74.0 in | Wt 303.0 lb

## 2020-08-25 DIAGNOSIS — R7303 Prediabetes: Secondary | ICD-10-CM

## 2020-08-25 DIAGNOSIS — Z9189 Other specified personal risk factors, not elsewhere classified: Secondary | ICD-10-CM | POA: Diagnosis not present

## 2020-08-25 DIAGNOSIS — Z6841 Body Mass Index (BMI) 40.0 and over, adult: Secondary | ICD-10-CM | POA: Diagnosis not present

## 2020-08-25 DIAGNOSIS — E559 Vitamin D deficiency, unspecified: Secondary | ICD-10-CM | POA: Diagnosis not present

## 2020-08-25 DIAGNOSIS — F3289 Other specified depressive episodes: Secondary | ICD-10-CM | POA: Diagnosis not present

## 2020-08-25 MED ORDER — OZEMPIC (1 MG/DOSE) 4 MG/3ML ~~LOC~~ SOPN
1.0000 mg | PEN_INJECTOR | SUBCUTANEOUS | 0 refills | Status: DC
Start: 2020-08-25 — End: 2020-10-06
  Filled 2020-08-25: qty 3, 28d supply, fill #0

## 2020-08-25 MED ORDER — VITAMIN D (ERGOCALCIFEROL) 1.25 MG (50000 UNIT) PO CAPS
ORAL_CAPSULE | ORAL | 0 refills | Status: DC
  Filled 2020-08-25: qty 4, 28d supply, fill #0

## 2020-08-26 NOTE — Progress Notes (Signed)
Chief Complaint:   OBESITY Phillip Roach is here to discuss his progress with his obesity treatment plan along with follow-up of his obesity related diagnoses. Phillip Roach is on the Category 4 Plan and states he is following his eating plan approximately 80% of the time. Phillip Roach states he is doing daily stuff around the house.  Today's visit was #: 18 Starting weight: 325 lbs Starting date: 09/24/2019 Today's weight: 303 lbs Today's date: 08/25/2020 Total lbs lost to date: 22 Total lbs lost since last in-office visit: 2  Interim History: Phillip Roach just returned from a beach trip. He voices that he has gotten a Environmental education officer from a friend recently. Over the last few weeks, he's found eating on plan to be more difficult. He feels he has been as on track as he could be.  Subjective:   1. Vitamin D deficiency [name] denies nausea, vomiting, and muscle weakness but notes fatigue. His last Vit D level was 37.6.  2. Pre-diabetes Phillip Roach is on 0.5 mg Ozempic weekly. His last A1c was 5.7 and insulin level 9.7. He reports having increased cravings on 0.5 mg weekly.  3. At risk for adverse drug reaction Phillip Roach is at risk for medication side effects due to increasing Ozempic to 1 mg.  Assessment/Plan:   1. Vitamin D deficiency Low Vitamin D level contributes to fatigue and are associated with obesity, breast, and colon cancer. He agrees to continue to take prescription Vitamin D @50 ,000 IU every week and will follow-up for routine testing of Vitamin D, at least 2-3 times per year to avoid over-replacement.  - Vitamin D, Ergocalciferol, (DRISDOL) 1.25 MG (50000 UNIT) CAPS capsule; TAKE 1 CAPSULE BY MOUTH EVERY 7 DAYS  Dispense: 4 capsule; Refill: 0  2. Pre-diabetes Phillip Roach will continue to work on weight loss, exercise, and decreasing simple carbohydrates to help decrease the risk of diabetes. Increase Ozempic to 1 mg weekly, as prescribed below.  - Semaglutide, 1 MG/DOSE, (OZEMPIC, 1 MG/DOSE,) 4 MG/3ML SOPN; Inject  1 mg into the skin once a week.  Dispense: 3 mL; Refill: 0  3. At risk for adverse drug reaction Phillip Roach was given approximately 15 minutes of drug side effect counseling today.  We discussed side effect possibility and risk versus benefits. Phillip Roach agreed to the medication and will contact this office if these side effects are intolerable.  Repetitive spaced learning was employed today to elicit superior memory formation and behavioral change.   4. Class 3 severe obesity with serious comorbidity and body mass index (BMI) of 40.0 to 44.9 in adult, unspecified obesity type Phillip Roach)  Phillip Roach is currently in the action stage of change. As such, his goal is to continue with weight loss efforts. He has agreed to the Category 4 Plan.   Exercise goals: All adults should avoid inactivity. Some physical activity is better than none, and adults who participate in any amount of physical activity gain some health benefits.  Behavioral modification strategies: increasing lean protein intake, meal planning and cooking strategies, keeping healthy foods in the home, and planning for success.  Phillip Roach has agreed to follow-up with our clinic in 2-3 weeks. He was informed of the importance of frequent follow-up visits to maximize his success with intensive lifestyle modifications for his multiple health conditions.   Objective:   Blood pressure 117/74, pulse 76, temperature (!) 97.5 F (36.4 C), height 6\' 2"  (1.88 m), weight (!) 303 lb (137.4 kg), SpO2 98 %. Body mass index is 38.9 kg/m.  General: Cooperative, alert,  well developed, in no acute distress. HEENT: Conjunctivae and lids unremarkable. Cardiovascular: Regular rhythm.  Lungs: Normal work of breathing. Neurologic: No focal deficits.   Lab Results  Component Value Date   CREATININE 0.85 06/24/2020   BUN 10 06/24/2020   NA 138 06/24/2020   K 4.3 06/24/2020   CL 105 06/24/2020   CO2 25 06/24/2020   Lab Results  Component Value Date   ALT 23  05/15/2020   AST 14 05/15/2020   ALKPHOS 50 05/15/2020   BILITOT 0.5 05/15/2020   Lab Results  Component Value Date   HGBA1C 5.7 (H) 02/25/2020   HGBA1C 5.7 (H) 09/24/2019   Lab Results  Component Value Date   INSULIN 9.7 02/25/2020   INSULIN 17.3 09/24/2019   Lab Results  Component Value Date   TSH 0.775 07/16/2019   Lab Results  Component Value Date   CHOL 177 02/25/2020   HDL 33 (L) 02/25/2020   LDLCALC 116 (H) 02/25/2020   LDLDIRECT 85.4 07/28/2007   TRIG 159 (H) 02/25/2020   CHOLHDL 5 06/27/2012   Lab Results  Component Value Date   WBC 8.9 06/24/2020   HGB 15.1 06/24/2020   HCT 44.6 06/24/2020   MCV 89.6 06/24/2020   PLT 241 06/24/2020   No results found for: IRON, TIBC, FERRITIN  Attestation Statements:   Reviewed by clinician on day of visit: allergies, medications, problem list, medical history, surgical history, family history, social history, and previous encounter notes.  Coral Ceo, CMA, am acting as transcriptionist for Coralie Common, MD.  I have reviewed the above documentation for accuracy and completeness, and I agree with the above. - Jinny Blossom, MD

## 2020-08-29 DIAGNOSIS — F329 Major depressive disorder, single episode, unspecified: Secondary | ICD-10-CM | POA: Diagnosis not present

## 2020-09-01 NOTE — Progress Notes (Signed)
  Office: [phone]  /  Fax: [phone]    Date: September 15, 2020   Appointment Start Time: 8:01am Duration: 25 minutes Provider: Glennie Roach, Psy.D. Type of Session: Individual Therapy  Location of Patient: Home Location of Provider: Provider's home (private office) Type of Contact: Telepsychological Visit via MyChart Video Visit  Session Content: Phillip Roach is a 40 y.o. male presenting for a follow-up appointment to address the previously established treatment goal of increasing coping skills. Today's appointment was a telepsychological visit due to COVID-19. Phillip Roach provided verbal consent for today's telepsychological appointment and he is aware he is responsible for securing confidentiality on his end of the session. Prior to proceeding with today's appointment, Phillip Roach's physical location at the time of this appointment was obtained as well a phone number he could be reached at in the event of technical difficulties. Phillip Roach and this provider participated in today's telepsychological service.   This provider conducted a brief check-in. Phillip Roach stated he has been "busy." He continues to report a reduction in emotional eating behaviors, especially when experiencing stress. Phillip Roach explained increased awareness has helped reduce emotional eating behaviors. A plan was developed to help Phillip Roach cope with emotional eating behaviors in the future using learned skills. He was encouraged to write down the following plan: focus on hydration; be prepared with snacks congruent to the meal plan/goals with clinic; pause to ask questions when triggered to eat (e.g., Am I really hungry?, Is there something bothering me?, and Will I feel better if I eat?); and engage in discussed coping strategies after going through the aforementioned questions. Overall, Phillip Roach was receptive to today's appointment as evidenced by openness to sharing, responsiveness to feedback, and willingness to continue engaging in learned  skills.  Mental Status Examination:  Appearance: well groomed and appropriate hygiene  Behavior: appropriate to circumstances Mood: euthymic Affect: mood congruent Speech: normal in rate, volume, and tone Eye Contact: appropriate Psychomotor Activity: unable to assess Gait: unable to assess Thought Process: linear, logical, and goal directed  Thought Content/Perception: no hallucinations, delusions, bizarre thinking or behavior reported or observed and no evidence or endorsement of suicidal and homicidal ideation, plan, and intent Orientation: time, person, place, and purpose of appointment Memory/Concentration: memory, attention, language, and fund of knowledge intact  Insight/Judgment: good  Interventions:  Conducted a brief chart review Provided empathic reflections and validation Reviewed learned skills Employed supportive psychotherapy interventions to facilitate reduced distress, and to improve coping skills with identified stressors  DSM-5 Diagnosis(es): F32.89 Other Specified Depressive Disorder, Emotional Eating Behaviors  Treatment Goal & Progress: During the initial appointment with this provider, the following treatment goal was established: increase coping skills. Phillip Roach demonstrated progress in his goal as evidenced by increased awareness of hunger patterns, increased awareness of triggers for emotional eating behaviors, and reduction in emotional eating behaviors . Phillip Roach also continues to demonstrate willingness to engage in learned skill(s).  Plan: As previously planned, today was Phillip Roach's last appointment with this provider. He noted a plan to continue attending individual therapy and marriage therapy. He acknowledged understanding that he may request a follow-up appointment with this provider in the future as long as he is still established with the clinic. No further follow-up planned by this provider.

## 2020-09-09 ENCOUNTER — Other Ambulatory Visit: Payer: Self-pay | Admitting: Family Medicine

## 2020-09-09 DIAGNOSIS — F3341 Major depressive disorder, recurrent, in partial remission: Secondary | ICD-10-CM

## 2020-09-10 ENCOUNTER — Other Ambulatory Visit (HOSPITAL_BASED_OUTPATIENT_CLINIC_OR_DEPARTMENT_OTHER): Payer: Self-pay

## 2020-09-10 MED ORDER — BUPROPION HCL ER (XL) 300 MG PO TB24
300.0000 mg | ORAL_TABLET | Freq: Every day | ORAL | 0 refills | Status: DC
  Filled 2020-09-10: qty 90, 90d supply, fill #0

## 2020-09-11 ENCOUNTER — Other Ambulatory Visit (HOSPITAL_BASED_OUTPATIENT_CLINIC_OR_DEPARTMENT_OTHER): Payer: Self-pay

## 2020-09-11 MED ORDER — DIAZEPAM 10 MG PO TABS
ORAL_TABLET | ORAL | 0 refills | Status: DC
  Filled 2020-09-11: qty 2, 2d supply, fill #0

## 2020-09-12 ENCOUNTER — Other Ambulatory Visit (HOSPITAL_BASED_OUTPATIENT_CLINIC_OR_DEPARTMENT_OTHER): Payer: Self-pay

## 2020-09-12 DIAGNOSIS — Z302 Encounter for sterilization: Secondary | ICD-10-CM | POA: Diagnosis not present

## 2020-09-12 MED ORDER — HYDROCODONE-ACETAMINOPHEN 5-325 MG PO TABS
ORAL_TABLET | ORAL | 0 refills | Status: DC
  Filled 2020-09-12: qty 8, 3d supply, fill #0

## 2020-09-15 ENCOUNTER — Other Ambulatory Visit: Payer: Self-pay

## 2020-09-15 ENCOUNTER — Ambulatory Visit (INDEPENDENT_AMBULATORY_CARE_PROVIDER_SITE_OTHER): Payer: 59 | Admitting: Family Medicine

## 2020-09-15 ENCOUNTER — Encounter (INDEPENDENT_AMBULATORY_CARE_PROVIDER_SITE_OTHER): Payer: Self-pay | Admitting: Family Medicine

## 2020-09-15 ENCOUNTER — Telehealth (INDEPENDENT_AMBULATORY_CARE_PROVIDER_SITE_OTHER): Payer: 59 | Admitting: Psychology

## 2020-09-15 VITALS — BP 143/78 | HR 86 | Temp 97.8°F | Ht 74.0 in | Wt 303.0 lb

## 2020-09-15 DIAGNOSIS — E559 Vitamin D deficiency, unspecified: Secondary | ICD-10-CM

## 2020-09-15 DIAGNOSIS — Z9189 Other specified personal risk factors, not elsewhere classified: Secondary | ICD-10-CM | POA: Diagnosis not present

## 2020-09-15 DIAGNOSIS — F3289 Other specified depressive episodes: Secondary | ICD-10-CM

## 2020-09-15 DIAGNOSIS — Z6841 Body Mass Index (BMI) 40.0 and over, adult: Secondary | ICD-10-CM | POA: Diagnosis not present

## 2020-09-15 DIAGNOSIS — R7303 Prediabetes: Secondary | ICD-10-CM | POA: Diagnosis not present

## 2020-09-16 ENCOUNTER — Other Ambulatory Visit (HOSPITAL_BASED_OUTPATIENT_CLINIC_OR_DEPARTMENT_OTHER): Payer: Self-pay

## 2020-09-16 MED FILL — Testosterone Cypionate IM Inj in Oil 200 MG/ML: INTRAMUSCULAR | 84 days supply | Qty: 6 | Fill #1 | Status: AC

## 2020-09-17 NOTE — Progress Notes (Signed)
Chief Complaint:   OBESITY Phillip Roach is here to discuss his progress with his obesity treatment plan along with follow-up of his obesity related diagnoses. Phillip Roach is on the Category 4 Plan and states he is following his eating plan approximately 60-70% of the time. Phillip Roach states he is walking, yard work, hiking, and [employer] ? minutes 2-3 times per week.  Today's visit was #: 62 Starting weight: 325 lbs Starting date: 09/24/2019 Today's weight: 303 lbs Today's date: 09/15/2020 Total lbs lost to date: 22 Total lbs lost since last in-office visit: 0  Interim History: Phillip Roach has been mostly up to work and home life over the last few weeks. Food wise, pt has been following plan minimally. So much has come up in his life- lack of available food, indulgent food brought to the house. There was a week of increased eating out but food now in the house.  Subjective:   1. Vitamin D deficiency Pt denies nausea, vomiting, and muscle weakness but notes fatigue. Pt is on prescription Vit D. His last Vit D level was 37.6.  2. Pre-diabetes Phillip Roach's last A1c was 5.7 and insulin level 9.7. He is on GLP-1 with some improvement in hunger control.  3. At risk for osteoporosis Phillip Roach is at higher risk of osteopenia and osteoporosis due to Vitamin D deficiency.   Assessment/Plan:   1. Vitamin D deficiency Low Vitamin D level contributes to fatigue and are associated with obesity, breast, and colon cancer. He agrees to continue to take prescription Vitamin D @50 ,000 IU every week and will follow-up for routine testing of Vitamin D, at least 2-3 times per year to avoid over-replacement.  2. Pre-diabetes Phillip Roach will continue to work on weight loss, exercise, and decreasing simple carbohydrates to help decrease the risk of diabetes.  Will need Ozempic at next appt.  3. At risk for osteoporosis Phillip Roach was given approximately 15 minutes of osteoporosis prevention counseling today. Phillip Roach is at risk for  osteopenia and osteoporosis due to his Vitamin D deficiency. He was encouraged to take his Vitamin D and follow his higher calcium diet and increase strengthening exercise to help strengthen his bones and decrease his risk of osteopenia and osteoporosis.  Repetitive spaced learning was employed today to elicit superior memory formation and behavioral change.  4. Class 3 severe obesity with serious comorbidity and body mass index (BMI) of 40.0 to 44.9 in adult, unspecified obesity type Phillip Roach)  Phillip Roach is currently in the action stage of change. As such, his goal is to continue with weight loss efforts. He has agreed to the Category 4 Plan and keeping a food journal and adhering to recommended goals of 2100-2300 calories and 135+ g protein.   Exercise goals:  As is  Behavioral modification strategies: increasing lean protein intake, meal planning and cooking strategies, keeping healthy foods in the home, and dealing with family or coworker sabotage.  [name] has agreed to follow-up with our clinic in 3 weeks. He was informed of the importance of frequent follow-up visits to maximize his success with intensive lifestyle modifications for his multiple health conditions.   Objective:   Blood pressure (!) 143/78, pulse 86, temperature 97.8 F (36.6 C), height 6\' 2"  (1.88 m), weight (!) 303 lb (137.4 kg), SpO2 97 %. Body mass index is 38.9 kg/m.  General: Cooperative, alert, well developed, in no acute distress. HEENT: Conjunctivae and lids unremarkable. Cardiovascular: Regular rhythm.  Lungs: Normal work of breathing. Neurologic: No focal deficits.   Lab  Results  Component Value Date   CREATININE 0.85 06/24/2020   BUN 10 06/24/2020   NA 138 06/24/2020   K 4.3 06/24/2020   CL 105 06/24/2020   CO2 25 06/24/2020   Lab Results  Component Value Date   ALT 23 05/15/2020   AST 14 05/15/2020   ALKPHOS 50 05/15/2020   BILITOT 0.5 05/15/2020   Lab Results  Component Value Date   HGBA1C 5.7  (H) 02/25/2020   HGBA1C 5.7 (H) 09/24/2019   Lab Results  Component Value Date   INSULIN 9.7 02/25/2020   INSULIN 17.3 09/24/2019   Lab Results  Component Value Date   TSH 0.775 07/16/2019   Lab Results  Component Value Date   CHOL 177 02/25/2020   HDL 33 (L) 02/25/2020   LDLCALC 116 (H) 02/25/2020   LDLDIRECT 85.4 07/28/2007   TRIG 159 (H) 02/25/2020   CHOLHDL 5 06/27/2012   Lab Results  Component Value Date   VD25OH 37.6 02/25/2020   VD25OH 22.2 (L) 07/16/2019   Lab Results  Component Value Date   WBC 8.9 06/24/2020   HGB 15.1 06/24/2020   HCT 44.6 06/24/2020   MCV 89.6 06/24/2020   PLT 241 06/24/2020   Attestation Statements:   Reviewed by clinician on day of visit: allergies, medications, problem list, medical history, surgical history, family history, social history, and previous encounter notes.  Coral Ceo, CMA, am acting as transcriptionist for Coralie Common, MD.  I have reviewed the above documentation for accuracy and completeness, and I agree with the above. - Coralie Common, MD

## 2020-09-19 ENCOUNTER — Other Ambulatory Visit (INDEPENDENT_AMBULATORY_CARE_PROVIDER_SITE_OTHER): Payer: Self-pay | Admitting: Family Medicine

## 2020-09-19 ENCOUNTER — Other Ambulatory Visit (HOSPITAL_BASED_OUTPATIENT_CLINIC_OR_DEPARTMENT_OTHER): Payer: Self-pay

## 2020-09-19 DIAGNOSIS — E559 Vitamin D deficiency, unspecified: Secondary | ICD-10-CM

## 2020-09-22 DIAGNOSIS — F329 Major depressive disorder, single episode, unspecified: Secondary | ICD-10-CM | POA: Diagnosis not present

## 2020-09-23 ENCOUNTER — Other Ambulatory Visit (HOSPITAL_BASED_OUTPATIENT_CLINIC_OR_DEPARTMENT_OTHER): Payer: Self-pay

## 2020-10-06 ENCOUNTER — Other Ambulatory Visit: Payer: Self-pay

## 2020-10-06 ENCOUNTER — Other Ambulatory Visit (HOSPITAL_BASED_OUTPATIENT_CLINIC_OR_DEPARTMENT_OTHER): Payer: Self-pay

## 2020-10-06 ENCOUNTER — Encounter (INDEPENDENT_AMBULATORY_CARE_PROVIDER_SITE_OTHER): Payer: Self-pay | Admitting: Family Medicine

## 2020-10-06 ENCOUNTER — Ambulatory Visit (INDEPENDENT_AMBULATORY_CARE_PROVIDER_SITE_OTHER): Payer: 59 | Admitting: Family Medicine

## 2020-10-06 VITALS — BP 121/74 | HR 89 | Temp 97.7°F | Ht 74.0 in | Wt 300.0 lb

## 2020-10-06 DIAGNOSIS — Z6841 Body Mass Index (BMI) 40.0 and over, adult: Secondary | ICD-10-CM

## 2020-10-06 DIAGNOSIS — F3289 Other specified depressive episodes: Secondary | ICD-10-CM | POA: Diagnosis not present

## 2020-10-06 DIAGNOSIS — R7303 Prediabetes: Secondary | ICD-10-CM | POA: Diagnosis not present

## 2020-10-06 DIAGNOSIS — E559 Vitamin D deficiency, unspecified: Secondary | ICD-10-CM | POA: Diagnosis not present

## 2020-10-06 DIAGNOSIS — Z9189 Other specified personal risk factors, not elsewhere classified: Secondary | ICD-10-CM

## 2020-10-06 MED ORDER — OZEMPIC (1 MG/DOSE) 4 MG/3ML ~~LOC~~ SOPN
1.0000 mg | PEN_INJECTOR | SUBCUTANEOUS | 0 refills | Status: DC
  Filled 2020-10-06: qty 3, 28d supply, fill #0

## 2020-10-07 NOTE — Progress Notes (Signed)
Chief Complaint:   OBESITY Phillip Roach is here to discuss his progress with his obesity treatment plan along with follow-up of his obesity related diagnoses. Phillip Roach is on the Category 4 Plan and states he is following his eating plan approximately 50% of the time. Phillip Roach states he is doing 6,000-10,000 steps and hiking 5-6 times per week.  Today's visit was #: 20 Starting weight: 325 lbs Starting date: 09/23/2020 Today's weight: 300 lbs Today's date: 10/06/2020 Total lbs lost to date: 25 Total lbs lost since last in-office visit: 3  Interim History: Phillip Roach is worried he has a sinus infection. He can't confidently say he has followed the plan more consistently than 50% of the time. He has had a busy few weeks at home and some work stress. Pt is realizing that under stress, he tends to feel less satiated and satisfied. He does have numerous facets of stress in life.  Subjective:   1. Vitamin D deficiency Phillip Roach denies nausea, vomiting, and muscle weakness but notes fatigue. He is on prescription Vit D. His last Vit D level was 37.6.  2. Pre-diabetes Phillip Roach is on Ozempic 1 mg weekly. He is now feeling th effects of 1 mg dose.   3.  Other Specified Depressive Disorder, Emotional Eating Behaviors Pt denies suicidal or homicidal ideations. Phillip Roach is on Wellbutrin 300 mg.  4. At risk for adverse drug reaction Phillip Roach is at risk for side effects of medication due to increasing Ozempic.  Assessment/Plan:   1. Vitamin D deficiency Low Vitamin D level contributes to fatigue and are associated with obesity, breast, and colon cancer. He agrees to continue to take prescription Vitamin D '@50'$ ,000 IU every week and will follow-up for routine testing of Vitamin D, at least 2-3 times per year to avoid over-replacement. Check labs at next appt.  2. Pre-diabetes Phillip Roach will continue to work on weight loss, exercise, and decreasing simple carbohydrates to help decrease the risk of diabetes.   Refill-  Semaglutide, 1 MG/DOSE, (OZEMPIC, 1 MG/DOSE,) 4 MG/3ML SOPN; Inject 1 mg into the skin once a week.  Dispense: 3 mL; Refill: 0  3.  Other Specified Depressive Disorder, Emotional Eating Behaviors Phillip Roach is to reach out to PCP for medication increase. Behavior modification techniques were discussed today to help Phillip Roach deal with his emotional/non-hunger eating behaviors.  Orders and follow up as documented in patient record.   4. At risk for adverse drug reaction Phillip Roach was given approximately 15 minutes of drug side effect counseling today.  We discussed side effect possibility and risk versus benefits. Phillip Roach agreed to the medication and will contact this office if these side effects are intolerable.  Repetitive spaced learning was employed today to elicit superior memory formation and behavioral change.   5. Obesity with current BMI of 38.6  Phillip Roach is currently in the action stage of change. As such, his goal is to continue with weight loss efforts. He has agreed to the Category 4 Plan.   Exercise goals:  As is  Behavioral modification strategies: increasing lean protein intake and meal planning and cooking strategies.  Phillip Roach has agreed to follow-up with our clinic in 3 weeks. He was informed of the importance of frequent follow-up visits to maximize his success with intensive lifestyle modifications for his multiple health conditions.   Objective:   Blood pressure 121/74, pulse 89, temperature 97.7 F (36.5 C), height '6\' 2"'$  (1.88 m), weight 300 lb (136.1 kg), SpO2 97 %. Body mass index is 38.52 kg/m.  General: Cooperative, alert, well developed, in no acute distress. HEENT: Conjunctivae and lids unremarkable. Cardiovascular: Regular rhythm.  Lungs: Normal work of breathing. Neurologic: No focal deficits.   Lab Results  Component Value Date   CREATININE 0.85 06/24/2020   BUN 10 06/24/2020   NA 138 06/24/2020   K 4.3 06/24/2020   CL 105 06/24/2020   CO2 25 06/24/2020   Lab  Results  Component Value Date   ALT 23 05/15/2020   AST 14 05/15/2020   ALKPHOS 50 05/15/2020   BILITOT 0.5 05/15/2020   Lab Results  Component Value Date   HGBA1C 5.7 (H) 02/25/2020   HGBA1C 5.7 (H) 09/24/2019   Lab Results  Component Value Date   INSULIN 9.7 02/25/2020   INSULIN 17.3 09/24/2019   Lab Results  Component Value Date   TSH 0.775 07/16/2019   Lab Results  Component Value Date   CHOL 177 02/25/2020   HDL 33 (L) 02/25/2020   LDLCALC 116 (H) 02/25/2020   LDLDIRECT 85.4 07/28/2007   TRIG 159 (H) 02/25/2020   CHOLHDL 5 06/27/2012   Lab Results  Component Value Date   VD25OH 37.6 02/25/2020   VD25OH 22.2 (L) 07/16/2019   Lab Results  Component Value Date   WBC 8.9 06/24/2020   HGB 15.1 06/24/2020   HCT 44.6 06/24/2020   MCV 89.6 06/24/2020   PLT 241 06/24/2020     Attestation Statements:   Reviewed by clinician on day of visit: allergies, medications, problem list, medical history, surgical history, family history, social history, and previous encounter notes.  Coral Ceo, CMA, am acting as transcriptionist for Coralie Common, MD.   I have reviewed the above documentation for accuracy and completeness, and I agree with the above. - Coralie Common, MD

## 2020-10-08 DIAGNOSIS — F329 Major depressive disorder, single episode, unspecified: Secondary | ICD-10-CM | POA: Diagnosis not present

## 2020-10-22 ENCOUNTER — Other Ambulatory Visit: Payer: Self-pay

## 2020-10-22 ENCOUNTER — Other Ambulatory Visit (HOSPITAL_BASED_OUTPATIENT_CLINIC_OR_DEPARTMENT_OTHER): Payer: Self-pay

## 2020-10-22 ENCOUNTER — Ambulatory Visit (INDEPENDENT_AMBULATORY_CARE_PROVIDER_SITE_OTHER): Payer: 59 | Admitting: Family Medicine

## 2020-10-22 ENCOUNTER — Encounter (INDEPENDENT_AMBULATORY_CARE_PROVIDER_SITE_OTHER): Payer: Self-pay | Admitting: Family Medicine

## 2020-10-22 VITALS — BP 115/73 | HR 77 | Temp 98.3°F | Ht 74.0 in | Wt 298.0 lb

## 2020-10-22 DIAGNOSIS — Z6841 Body Mass Index (BMI) 40.0 and over, adult: Secondary | ICD-10-CM

## 2020-10-22 DIAGNOSIS — R7303 Prediabetes: Secondary | ICD-10-CM | POA: Diagnosis not present

## 2020-10-22 DIAGNOSIS — E7849 Other hyperlipidemia: Secondary | ICD-10-CM | POA: Diagnosis not present

## 2020-10-22 DIAGNOSIS — E559 Vitamin D deficiency, unspecified: Secondary | ICD-10-CM

## 2020-10-22 DIAGNOSIS — E349 Endocrine disorder, unspecified: Secondary | ICD-10-CM

## 2020-10-22 DIAGNOSIS — Z9189 Other specified personal risk factors, not elsewhere classified: Secondary | ICD-10-CM | POA: Diagnosis not present

## 2020-10-22 MED ORDER — OZEMPIC (1 MG/DOSE) 4 MG/3ML ~~LOC~~ SOPN
1.0000 mg | PEN_INJECTOR | SUBCUTANEOUS | 0 refills | Status: DC
  Filled 2020-10-22 – 2020-10-31 (×3): qty 3, 28d supply, fill #0

## 2020-10-22 MED ORDER — VITAMIN D (ERGOCALCIFEROL) 1.25 MG (50000 UNIT) PO CAPS
ORAL_CAPSULE | ORAL | 0 refills | Status: DC
Start: 2020-10-22 — End: 2020-11-24
  Filled 2020-10-22 – 2020-10-31 (×2): qty 4, 28d supply, fill #0

## 2020-10-23 ENCOUNTER — Ambulatory Visit (INDEPENDENT_AMBULATORY_CARE_PROVIDER_SITE_OTHER): Payer: 59 | Admitting: Family Medicine

## 2020-10-23 ENCOUNTER — Other Ambulatory Visit: Payer: Self-pay

## 2020-10-23 NOTE — Progress Notes (Signed)
Chief Complaint:   OBESITY Phillip Roach is here to discuss his progress with his obesity treatment plan along with follow-up of his obesity related diagnoses. [name] is on the Category 4 Plan and states he is following his eating plan approximately 80% of the time. [name] states he is exercising 120 minutes 3 times per week.  Today's visit was #: 21 Starting weight: 325 lbs Starting date: 09/23/2020 Today's weight: 298 lbs Today's date: 10/22/2020 Total lbs lost to date: 27 Total lbs lost since last in-office visit: 2  Interim History: [name] is excited about being under 300 lbs. She is doing well on Ozempic. She is eating a bit more chocolate than previously due to cravings. He incorporated rice into his diet and cheaper bread. He is working on increasing protein compared to carb amount. Pt has no plans for Labor Day weekend.  Subjective:   1. Vitamin D deficiency He is currently taking prescription vitamin D 50,000 IU each week. He denies nausea, vomiting or muscle weakness. He reports fatigue.  Lab Results  Component Value Date   VD25OH 41.3 10/22/2020   VD25OH 37.6 02/25/2020   VD25OH 22.2 (L) 07/16/2019   2. Other hyperlipidemia Eldra's last LDL was 116, HDL 33, and triglycerides 159. She is not on statin therapy.  3. Pre-diabetes His last A1c was 5.7 and insulin level 9.7. He is on GLP-1 1 mg with no side effects.  4. Testosterone deficiency Elvon is on testosterone injections every 14 days. His last CBC was within normal limits.  5. At risk for osteoporosis [name] is at higher risk of osteopenia and osteoporosis due to Vitamin D deficiency.   Assessment/Plan:   1. Vitamin D deficiency Low Vitamin D level contributes to fatigue and are associated with obesity, breast, and colon cancer. He agrees to continue to take prescription Vitamin D 50,000 IU every week and will follow-up for routine testing of Vitamin D, at least 2-3 times per year to avoid over-replacement. Check labs  today.  Refill- Vitamin D, Ergocalciferol, (DRISDOL) 1.25 MG (50000 UNIT) CAPS capsule; TAKE 1 CAPSULE BY MOUTH EVERY 7 DAYS  Dispense: 4 capsule; Refill: 0  - VITAMIN D 25 Hydroxy (Vit-D Deficiency, Fractures)  2. Other hyperlipidemia Cardiovascular risk and specific lipid/LDL goals reviewed.  We discussed several lifestyle modifications today and Arnold will continue to work on diet, exercise and weight loss efforts. Orders and follow up as documented in patient record.   Counseling Intensive lifestyle modifications are the first line treatment for this issue. Dietary changes: Increase soluble fiber. Decrease simple carbohydrates. Exercise changes: Moderate to vigorous-intensity aerobic activity 150 minutes per week if tolerated. Lipid-lowering medications: see documented in medical record. Check labs today.  - Lipid Panel With LDL/HDL Ratio  3. Pre-diabetes Tyking will continue to work on weight loss, exercise, and decreasing simple carbohydrates to help decrease the risk of diabetes.  Check labs today.  Refill- Semaglutide, 1 MG/DOSE, (OZEMPIC, 1 MG/DOSE,) 4 MG/3ML SOPN; Inject 1 mg into the skin once a week.  Dispense: 3 mL; Refill: 0  - Hemoglobin A1c - Insulin, random  4. Testosterone deficiency Check labs today.  - Testosterone,Free and Total - CBC  5. At risk for osteoporosis [name] was given approximately 15 minutes of osteoporosis prevention counseling today. [name] is at risk for osteopenia and osteoporosis due to his Vitamin D deficiency. He was encouraged to take his Vitamin D and follow his higher calcium diet and increase strengthening exercise to help strengthen his bones and decrease  his risk of osteopenia and osteoporosis.  Repetitive spaced learning was employed today to elicit superior memory formation and behavioral change.  6. Obesity with current BMI of 38.3  Haley is currently in the action stage of change. As such, his goal is to continue with weight loss  efforts. He has agreed to the Category 4 Plan.   Exercise goals:  Pt is to continue yard work over the next few weeks.  Behavioral modification strategies: increasing lean protein intake, meal planning and cooking strategies, keeping healthy foods in the home, and planning for success.  [name] has agreed to follow-up with our clinic in 3 weeks. He was informed of the importance of frequent follow-up visits to maximize his success with intensive lifestyle modifications for his multiple health conditions.   [name] was informed we would discuss his lab results at his next visit unless there is a critical issue that needs to be addressed sooner. [name] agreed to keep his next visit at the agreed upon time to discuss these results.  Objective:   Blood pressure 115/73, pulse 77, temperature 98.3 F (36.8 C), height '6\' 2"'$  (1.88 m), weight 298 lb (135.2 kg), SpO2 98 %. Body mass index is 38.26 kg/m.  General: Cooperative, alert, well developed, in no acute distress. HEENT: Conjunctivae and lids unremarkable. Cardiovascular: Regular rhythm.  Lungs: Normal work of breathing. Neurologic: No focal deficits.   Lab Results  Component Value Date   CREATININE 0.85 06/24/2020   BUN 10 06/24/2020   NA 138 06/24/2020   K 4.3 06/24/2020   CL 105 06/24/2020   CO2 25 06/24/2020   Lab Results  Component Value Date   ALT 23 05/15/2020   AST 14 05/15/2020   ALKPHOS 50 05/15/2020   BILITOT 0.5 05/15/2020   Lab Results  Component Value Date   HGBA1C 5.2 10/22/2020   HGBA1C 5.7 (H) 02/25/2020   HGBA1C 5.7 (H) 09/24/2019   Lab Results  Component Value Date   INSULIN 6.5 10/22/2020   INSULIN 9.7 02/25/2020   INSULIN 17.3 09/24/2019   Lab Results  Component Value Date   TSH 0.775 07/16/2019   Lab Results  Component Value Date   CHOL 150 10/22/2020   HDL 33 (L) 10/22/2020   LDLCALC 99 10/22/2020   LDLDIRECT 85.4 07/28/2007   TRIG 98 10/22/2020   CHOLHDL 5 06/27/2012   Lab Results   Component Value Date   VD25OH 41.3 10/22/2020   VD25OH 37.6 02/25/2020   VD25OH 22.2 (L) 07/16/2019   Lab Results  Component Value Date   WBC 8.1 10/22/2020   HGB 15.6 10/22/2020   HCT 46.9 10/22/2020   MCV 86 10/22/2020   PLT 263 10/22/2020    Attestation Statements:   Reviewed by clinician on day of visit: allergies, medications, problem list, medical history, surgical history, family history, social history, and previous encounter notes.  Coral Ceo, CMA, am acting as transcriptionist for Coralie Common, MD.   I have reviewed the above documentation for accuracy and completeness, and I agree with the above. - Coralie Common, MD

## 2020-10-25 LAB — LIPID PANEL WITH LDL/HDL RATIO
Cholesterol, Total: 150 mg/dL (ref 100–199)
HDL: 33 mg/dL — ABNORMAL LOW (ref >39)
LDL Chol Calc (NIH): 99 mg/dL (ref 0–99)
LDL/HDL Ratio: 3 ratio (ref 0.0–3.6)
Triglycerides: 98 mg/dL (ref 0–149)
VLDL Cholesterol Cal: 18 mg/dL (ref 5–40)

## 2020-10-25 LAB — TESTOSTERONE,FREE AND TOTAL
Testosterone, Free: 17.6 pg/mL (ref 8.7–25.1)
Testosterone: 570 ng/dL (ref 264–916)

## 2020-10-25 LAB — CBC
Hematocrit: 46.9 % (ref 37.5–51.0)
Hemoglobin: 15.6 g/dL (ref 13.0–17.7)
MCH: 28.5 pg (ref 26.6–33.0)
MCHC: 33.3 g/dL (ref 31.5–35.7)
MCV: 86 fL (ref 79–97)
Platelets: 263 10*3/uL (ref 150–450)
RBC: 5.47 x10E6/uL (ref 4.14–5.80)
RDW: 12.5 % (ref 11.6–15.4)
WBC: 8.1 10*3/uL (ref 3.4–10.8)

## 2020-10-25 LAB — VITAMIN D 25 HYDROXY (VIT D DEFICIENCY, FRACTURES): Vit D, 25-Hydroxy: 41.3 ng/mL (ref 30.0–100.0)

## 2020-10-25 LAB — HEMOGLOBIN A1C
Est. average glucose Bld gHb Est-mCnc: 103 mg/dL
Hgb A1c MFr Bld: 5.2 % (ref 4.8–5.6)

## 2020-10-25 LAB — INSULIN, RANDOM: INSULIN: 6.5 u[IU]/mL (ref 2.6–24.9)

## 2020-10-28 ENCOUNTER — Other Ambulatory Visit (HOSPITAL_BASED_OUTPATIENT_CLINIC_OR_DEPARTMENT_OTHER): Payer: Self-pay

## 2020-10-29 ENCOUNTER — Other Ambulatory Visit (HOSPITAL_BASED_OUTPATIENT_CLINIC_OR_DEPARTMENT_OTHER): Payer: Self-pay

## 2020-10-31 ENCOUNTER — Other Ambulatory Visit (HOSPITAL_BASED_OUTPATIENT_CLINIC_OR_DEPARTMENT_OTHER): Payer: Self-pay

## 2020-11-03 DIAGNOSIS — F329 Major depressive disorder, single episode, unspecified: Secondary | ICD-10-CM | POA: Diagnosis not present

## 2020-11-17 ENCOUNTER — Encounter: Payer: Self-pay | Admitting: Family Medicine

## 2020-11-17 ENCOUNTER — Other Ambulatory Visit: Payer: Self-pay

## 2020-11-17 ENCOUNTER — Other Ambulatory Visit (HOSPITAL_BASED_OUTPATIENT_CLINIC_OR_DEPARTMENT_OTHER): Payer: Self-pay

## 2020-11-17 ENCOUNTER — Ambulatory Visit: Payer: 59 | Admitting: Family Medicine

## 2020-11-17 VITALS — BP 136/80 | HR 88 | Temp 97.9°F

## 2020-11-17 DIAGNOSIS — Z23 Encounter for immunization: Secondary | ICD-10-CM | POA: Diagnosis not present

## 2020-11-17 DIAGNOSIS — F3341 Major depressive disorder, recurrent, in partial remission: Secondary | ICD-10-CM | POA: Diagnosis not present

## 2020-11-17 DIAGNOSIS — M7711 Lateral epicondylitis, right elbow: Secondary | ICD-10-CM | POA: Diagnosis not present

## 2020-11-17 DIAGNOSIS — Z6841 Body Mass Index (BMI) 40.0 and over, adult: Secondary | ICD-10-CM | POA: Diagnosis not present

## 2020-11-17 DIAGNOSIS — Z0000 Encounter for general adult medical examination without abnormal findings: Secondary | ICD-10-CM

## 2020-11-17 DIAGNOSIS — E291 Testicular hypofunction: Secondary | ICD-10-CM

## 2020-11-17 MED ORDER — BUPROPION HCL ER (XL) 150 MG PO TB24
450.0000 mg | ORAL_TABLET | Freq: Every day | ORAL | 1 refills | Status: DC
  Filled 2020-11-17: qty 270, 90d supply, fill #0
  Filled 2021-02-27: qty 270, 90d supply, fill #1

## 2020-11-17 MED ORDER — DICLOFENAC SODIUM 1 % EX GEL
CUTANEOUS | 1 refills | Status: DC
  Filled 2020-11-17: qty 100, 20d supply, fill #0

## 2020-11-17 NOTE — Progress Notes (Addendum)
Established Patient Office Visit  Subjective:  Patient ID: Phillip Roach, male    DOB: Jun 10, 1980  Age: 40 y.o. MRN: [id number]  CC:  Chief Complaint  Patient presents with   Follow-up    6 month f/u on chronic fatigue, weight loss, depression, and testosterone.  Pt states there was improvement in the beginning but he feels that now it is tapering off.      HPI Phillip Roach presents for follow-up of depression and imaging deficiency.  Feels as though he is at an impasse.  Testosterone seems to be helping energy levels still does not feel as though he has as much energy as he would like.  Has been able to lose some weight and continues with weight loss management.  Feels as though he is reached a plateau with Wellbutrin.  Not exercising out of work.  Things are stressful at home with 2 small children and 2 aging parents living in the house.  He is having problems with his wife.  She has been resistant about going to counseling.  He continues to go twice monthly on his own.  Complains of right elbow pain he is right-hand dominant.  Past Medical History:  Diagnosis Date   ADHD    ADHD (attention deficit hyperactivity disorder)    Asthma    as a child, no inhaler - exercise and seasonal allergies induced   Back pain    Depression    Fatigue    GERD (gastroesophageal reflux disease)    in past , no current problems   Heartburn    Hypertension    in past = no current problems, no meds   Joint pain    Lactose intolerance    Overweight    Pre-diabetes    Sleep apnea    does not use cpap   Vitamin D deficiency     Past Surgical History:  Procedure Laterality Date   FOREHEAD RECONSTRUCTION Right    Right Temple Shrapnel Removed   PAROTIDECTOMY Left 06/24/2020   Procedure: SUPERFICIAL PAROTIDECTOMY WITH FACIAL DISSECTION;  Surgeon: Rozetta Nunnery, MD;  Location: [facility] OR;  Service: ENT;  Laterality: Left;   PILONIDAL CYST DRAINAGE     WISDOM TOOTH EXTRACTION       Family History  Problem Relation Age of Onset   GER disease Mother    Depression Mother    Alcohol abuse Mother    Obesity Mother    Anxiety disorder Father    Hypertension Father    Sleep apnea Father    Obesity Father     Social History   Socioeconomic History   Marital status: Married    Spouse name: Not on file   Number of children: Not on file   Years of education: Not on file   Highest education level: Not on file  Occupational History   Occupation: Paramedic  Tobacco Use   Smoking status: Never   Smokeless tobacco: Never  Vaping Use   Vaping Use: Never used  Substance and Sexual Activity   Alcohol use: Yes    Comment: couple times a month   Drug use: No   Sexual activity: Yes  Other Topics Concern   Not on file  Social History Narrative   Married and has baby on the way    Works for [employer] to [employer]   Social Determinants of Radio broadcast assistant Strain: Not on file  Food Insecurity: Not on file  Transportation Needs: Not on file  Physical Activity: Not on file  Stress: Not on file  Social Connections: Not on file  Intimate Partner Violence: Not on file    Outpatient Medications Prior to Visit  Medication Sig Dispense Refill   acetaminophen (TYLENOL) 500 MG tablet Take 1 tablet (500 mg total) by mouth every 6 (six) hours as needed. (Patient taking differently: Take 1,000 mg by mouth every 6 (six) hours as needed for moderate pain or headache.) 30 tablet 0   b complex vitamins tablet Take 2 tablets by mouth daily.     ibuprofen (ADVIL,MOTRIN) 200 MG tablet Take 800 mg by mouth every 6 (six) hours as needed for pain.     loratadine (CLARITIN) 10 MG tablet Take 10 mg by mouth daily.     Semaglutide, 1 MG/DOSE, (OZEMPIC, 1 MG/DOSE,) 4 MG/3ML SOPN Inject 1 mg into the skin once a week. 3 mL 0   testosterone cypionate (DEPOTESTOSTERONE CYPIONATE) 200 MG/ML injection INJECT 1 ML (200 MG TOTAL) INTO THE MUSCLE EVERY 14 (FOURTEEN) DAYS. 10  mL 1   triamcinolone (NASACORT) 55 MCG/ACT AERO nasal inhaler Place 1 spray into the nose daily.     Vitamin D, Ergocalciferol, (DRISDOL) 1.25 MG (50000 UNIT) CAPS capsule TAKE 1 CAPSULE BY MOUTH EVERY 7 DAYS 4 capsule 0   buPROPion (WELLBUTRIN XL) 300 MG 24 hr tablet Take 1 tablet (300 mg total) by mouth daily. 90 tablet 0   diazepam (VALIUM) 10 MG tablet Take 1 tablet by mouth 2 hours prior to the procedure and take 1 tablet if needed as directed (Patient not taking: Reported on 11/17/2020) 2 tablet 0   HYDROcodone-acetaminophen (NORCO/VICODIN) 5-325 MG tablet Take 1 tablet by mouth every 6 hours as needed for pain (Patient not taking: Reported on 11/17/2020) 8 tablet 0   No facility-administered medications prior to visit.    Allergies  Allergen Reactions   Shellfish Allergy Anaphylaxis    Iodine ok per pt   Keflex [Cephalexin] Rash    ROS Review of Systems  Constitutional:  Positive for fatigue.  HENT: Negative.    Respiratory: Negative.    Cardiovascular: Negative.   Gastrointestinal: Negative.   Musculoskeletal:  Positive for arthralgias.  Neurological:  Negative for weakness and numbness.  Hematological:  Does not bruise/bleed easily.     Depression screen [facility] 2/9 11/17/2020 05/15/2020 09/24/2019  Decreased Interest 2 0 3  Down, Depressed, Hopeless 0 0 3  PHQ - 2 Score 2 0 6  Altered sleeping 2 - 3  Tired, decreased energy 3 - 3  Change in appetite 1 - 3  Feeling bad or failure about yourself  1 - 1  Trouble concentrating 1 - 1  Moving slowly or fidgety/restless 0 - 0  Suicidal thoughts 0 - 0  PHQ-9 Score 10 - 17  Difficult doing work/chores Somewhat difficult - Somewhat difficult     Objective:    Physical Exam Vitals and nursing note reviewed.  Constitutional:      General: He is not in acute distress.    Appearance: Normal appearance. He is not ill-appearing, toxic-appearing or diaphoretic.  HENT:     Head: Normocephalic and atraumatic.     Right Ear:  Tympanic membrane, ear canal and external ear normal.     Left Ear: Tympanic membrane normal.     Mouth/Throat:     Mouth: Mucous membranes are moist.     Pharynx: Oropharynx is clear. No oropharyngeal exudate or posterior oropharyngeal erythema.  Eyes:  General: No scleral icterus.       Right eye: No discharge.        Left eye: No discharge.     Extraocular Movements: Extraocular movements intact.     Conjunctiva/sclera: Conjunctivae normal.     Pupils: Pupils are equal, round, and reactive to light.  Cardiovascular:     Rate and Rhythm: Normal rate and regular rhythm.  Pulmonary:     Effort: Pulmonary effort is normal.     Breath sounds: Normal breath sounds.  Musculoskeletal:     Right elbow: No swelling. Normal range of motion. Tenderness present in lateral epicondyle.       Arms:     Cervical back: No rigidity or tenderness.  Lymphadenopathy:     Cervical: No cervical adenopathy.  Skin:    General: Skin is warm and dry.  Neurological:     Mental Status: He is alert and oriented to person, place, and time.  Psychiatric:        Mood and Affect: Mood normal.        Behavior: Behavior normal.    BP 136/80 (BP Location: Left Arm, Patient Position: Sitting, Cuff Size: Large)   Pulse 88   Temp 97.9 F (36.6 C) (Temporal)   SpO2 98%  Wt Readings from Last 3 Encounters:  10/22/20 298 lb (135.2 kg)  10/06/20 300 lb (136.1 kg)  09/15/20 (!) 303 lb (137.4 kg)     Health Maintenance Due  Topic Date Due   HIV Screening  Never done   Hepatitis C Screening  Never done   INFLUENZA VACCINE  10/06/2020    There are no preventive care reminders to display for this patient.  Lab Results  Component Value Date   TSH 0.775 07/16/2019   Lab Results  Component Value Date   WBC 8.1 10/22/2020   HGB 15.6 10/22/2020   HCT 46.9 10/22/2020   MCV 86 10/22/2020   PLT 263 10/22/2020   Lab Results  Component Value Date   NA 138 06/24/2020   K 4.3 06/24/2020   CO2 25  06/24/2020   GLUCOSE 87 06/24/2020   BUN 10 06/24/2020   CREATININE 0.85 06/24/2020   BILITOT 0.5 05/15/2020   ALKPHOS 50 05/15/2020   AST 14 05/15/2020   ALT 23 05/15/2020   PROT 7.2 05/15/2020   ALBUMIN 4.5 05/15/2020   CALCIUM 9.1 06/24/2020   ANIONGAP 8 06/24/2020   GFR 108.49 05/15/2020   Lab Results  Component Value Date   CHOL 150 10/22/2020   Lab Results  Component Value Date   HDL 33 (L) 10/22/2020   Lab Results  Component Value Date   LDLCALC 99 10/22/2020   Lab Results  Component Value Date   TRIG 98 10/22/2020   Lab Results  Component Value Date   CHOLHDL 5 06/27/2012   Lab Results  Component Value Date   HGBA1C 5.2 10/22/2020      Assessment & Plan:   Problem List Items Addressed This Visit       Endocrine   Androgen deficiency - Primary     Other   Recurrent major depressive disorder, in partial remission (HCC)   Relevant Medications   buPROPion (WELLBUTRIN XL) 150 MG 24 hr tablet   Class 3 severe obesity with serious comorbidity and body mass index (BMI) of 40.0 to 44.9 in adult [facility])   Other Visit Diagnoses     Healthcare maintenance       Relevant Orders   Flu Vaccine  QUAD 6+ mos PF IM (Fluarix Quad PF)   Lateral epicondylitis of right elbow           Meds ordered this encounter  Medications   buPROPion (WELLBUTRIN XL) 150 MG 24 hr tablet    Sig: Take 3 tablets (450 mg total) by mouth daily.    Dispense:  270 tablet    Refill:  1    Follow-up: Return in about 2 months (around 01/17/2021).  Recent labs look good.  Encouraged exercise work.  Given information on mindfulness based stress reduction and exercising to stay healthy.  Have increased Wellbutrin to 450 mg daily.  Suggest that he go ahead and use an elbow strap and apply Voltaren gel as needed.  Information was given on lateral epicondylitis.  Advised him to do as much lifting with the right arm with the palm up as possible. [name], MD

## 2020-11-17 NOTE — Addendum Note (Signed)
Addended by: Jon Billings on: 11/17/2020 09:16 AM   Modules accepted: Orders

## 2020-11-24 ENCOUNTER — Other Ambulatory Visit: Payer: Self-pay

## 2020-11-24 ENCOUNTER — Other Ambulatory Visit (HOSPITAL_BASED_OUTPATIENT_CLINIC_OR_DEPARTMENT_OTHER): Payer: Self-pay

## 2020-11-24 ENCOUNTER — Encounter (INDEPENDENT_AMBULATORY_CARE_PROVIDER_SITE_OTHER): Payer: Self-pay | Admitting: Family Medicine

## 2020-11-24 ENCOUNTER — Ambulatory Visit (INDEPENDENT_AMBULATORY_CARE_PROVIDER_SITE_OTHER): Payer: 59 | Admitting: Family Medicine

## 2020-11-24 VITALS — BP 145/80 | HR 77 | Temp 98.2°F | Ht 74.0 in | Wt 301.0 lb

## 2020-11-24 DIAGNOSIS — R7303 Prediabetes: Secondary | ICD-10-CM | POA: Diagnosis not present

## 2020-11-24 DIAGNOSIS — F329 Major depressive disorder, single episode, unspecified: Secondary | ICD-10-CM | POA: Diagnosis not present

## 2020-11-24 DIAGNOSIS — Z6841 Body Mass Index (BMI) 40.0 and over, adult: Secondary | ICD-10-CM

## 2020-11-24 DIAGNOSIS — E559 Vitamin D deficiency, unspecified: Secondary | ICD-10-CM

## 2020-11-24 DIAGNOSIS — Z9189 Other specified personal risk factors, not elsewhere classified: Secondary | ICD-10-CM | POA: Diagnosis not present

## 2020-11-24 MED ORDER — OZEMPIC (1 MG/DOSE) 4 MG/3ML ~~LOC~~ SOPN
1.0000 mg | PEN_INJECTOR | SUBCUTANEOUS | 0 refills | Status: DC
  Filled 2020-11-24 – 2020-12-03 (×2): qty 9, 84d supply, fill #0

## 2020-11-24 MED ORDER — VITAMIN D (ERGOCALCIFEROL) 1.25 MG (50000 UNIT) PO CAPS
ORAL_CAPSULE | ORAL | 0 refills | Status: DC
  Filled 2020-11-24 – 2020-12-03 (×2): qty 4, 28d supply, fill #0

## 2020-11-25 NOTE — Progress Notes (Signed)
Chief Complaint:   OBESITY Phillip Roach is here to discuss his progress with his obesity treatment plan along with follow-up of his obesity related diagnoses. [name] is on the Category 4 Plan and states he is following his eating plan approximately 85% of the time. [name] states he is walking 3 miles 1 time a week.  Today's visit was #: 22 Starting weight: 325 lbs Starting date: 09/23/2020 Today's weight: 301 lbs Today's date: 11/24/2020 Total lbs lost to date: 24 Total lbs lost since last in-office visit: 0  Interim History: [name] voices that in the last month, the availability of sweets has been an obstacles. He went hiking over the weekend and was more lax on carb intake. He just got a new puppy. He had a recent increase in Wellbutrin. Pt recognizes he has been indulgent in the last few weeks.    Subjective:   1. Vitamin D deficiency Phillip Roach denies nausea, vomiting, and muscle weakness but notes fatigue. He is on prescription Vit D. His last Vit D level was 41.3.  2. Pre-diabetes Pt is on Ozempic 1 mg weekly and denies GI side effects. He is learning how to decide when he is full enough to stop.  3. At risk of diabetes mellitus Tiger is at higher than average risk for developing diabetes due to obesity.   Assessment/Plan:   1. Vitamin D deficiency Low Vitamin D level contributes to fatigue and are associated with obesity, breast, and colon cancer. He agrees to continue to take prescription Vitamin D 50,000 IU every week and will follow-up for routine testing of Vitamin D, at least 2-3 times per year to avoid over-replacement.  Refill- Vitamin D, Ergocalciferol, (DRISDOL) 1.25 MG (50000 UNIT) CAPS capsule; TAKE 1 CAPSULE BY MOUTH EVERY 7 DAYS  Dispense: 4 capsule; Refill: 0  2. Pre-diabetes Phillip Roach will continue to work on weight loss, exercise, and decreasing simple carbohydrates to help decrease the risk of diabetes. Pt is to stay on 1 mg dose Ozempic, then MAY increase to 1 mg every 4  days.  Refill- Semaglutide, 1 MG/DOSE, (OZEMPIC, 1 MG/DOSE,) 4 MG/3ML SOPN; Inject 1 mg into the skin once a week.  Dispense: 9 mL; Refill: 0  3. At risk of diabetes mellitus Leanard was given approximately 15 minutes of diabetes education and counseling today. We discussed intensive lifestyle modifications today with an emphasis on weight loss as well as increasing exercise and decreasing simple carbohydrates in his diet. We also reviewed medication options with an emphasis on risk versus benefit of those discussed.   Repetitive spaced learning was employed today to elicit superior memory formation and behavioral change.  4. Obesity with current BMI of 38.6  Phillip Roach is currently in the action stage of change. As such, his goal is to continue with weight loss efforts. He has agreed to the Category 4 Plan.   Exercise goals: All adults should avoid inactivity. Some physical activity is better than none, and adults who participate in any amount of physical activity gain some health benefits.  Behavioral modification strategies: increasing lean protein intake, meal planning and cooking strategies, keeping healthy foods in the home, and planning for success.  Phillip Roach has agreed to follow-up with our clinic in 2.5 weeks. He was informed of the importance of frequent follow-up visits to maximize his success with intensive lifestyle modifications for his multiple health conditions.   Objective:   Blood pressure (!) 145/80, pulse 77, temperature 98.2 F (36.8 C), height 6\' 2"  (1.88 m), weight [name])  301 lb (136.5 kg), SpO2 97 %. Body mass index is 38.65 kg/m.  General: Cooperative, alert, well developed, in no acute distress. HEENT: Conjunctivae and lids unremarkable. Cardiovascular: Regular rhythm.  Lungs: Normal work of breathing. Neurologic: No focal deficits.   Lab Results  Component Value Date   CREATININE 0.85 06/24/2020   BUN 10 06/24/2020   NA 138 06/24/2020   K 4.3 06/24/2020   CL 105  06/24/2020   CO2 25 06/24/2020   Lab Results  Component Value Date   ALT 23 05/15/2020   AST 14 05/15/2020   ALKPHOS 50 05/15/2020   BILITOT 0.5 05/15/2020   Lab Results  Component Value Date   HGBA1C 5.2 10/22/2020   HGBA1C 5.7 (H) 02/25/2020   HGBA1C 5.7 (H) 09/24/2019   Lab Results  Component Value Date   INSULIN 6.5 10/22/2020   INSULIN 9.7 02/25/2020   INSULIN 17.3 09/24/2019   Lab Results  Component Value Date   TSH 0.775 07/16/2019   Lab Results  Component Value Date   CHOL 150 10/22/2020   HDL 33 (L) 10/22/2020   LDLCALC 99 10/22/2020   LDLDIRECT 85.4 07/28/2007   TRIG 98 10/22/2020   CHOLHDL 5 06/27/2012   Lab Results  Component Value Date   VD25OH 41.3 10/22/2020   VD25OH 37.6 02/25/2020   VD25OH 22.2 (L) 07/16/2019   Lab Results  Component Value Date   WBC 8.1 10/22/2020   HGB 15.6 10/22/2020   HCT 46.9 10/22/2020   MCV 86 10/22/2020   PLT 263 10/22/2020    Attestation Statements:   Reviewed by clinician on day of visit: allergies, medications, problem list, medical history, surgical history, family history, social history, and previous encounter notes.  Coral Ceo, CMA, am acting as transcriptionist for Coralie Common, MD.  I have reviewed the above documentation for accuracy and completeness, and I agree with the above. - Coralie Common, MD

## 2020-12-01 ENCOUNTER — Other Ambulatory Visit (HOSPITAL_BASED_OUTPATIENT_CLINIC_OR_DEPARTMENT_OTHER): Payer: Self-pay

## 2020-12-03 ENCOUNTER — Other Ambulatory Visit (HOSPITAL_BASED_OUTPATIENT_CLINIC_OR_DEPARTMENT_OTHER): Payer: Self-pay

## 2020-12-11 ENCOUNTER — Ambulatory Visit (INDEPENDENT_AMBULATORY_CARE_PROVIDER_SITE_OTHER): Payer: 59 | Admitting: Family Medicine

## 2020-12-15 ENCOUNTER — Other Ambulatory Visit (HOSPITAL_BASED_OUTPATIENT_CLINIC_OR_DEPARTMENT_OTHER): Payer: Self-pay

## 2020-12-15 ENCOUNTER — Other Ambulatory Visit: Payer: Self-pay | Admitting: Family Medicine

## 2020-12-15 DIAGNOSIS — E291 Testicular hypofunction: Secondary | ICD-10-CM

## 2020-12-15 MED ORDER — TESTOSTERONE CYPIONATE 200 MG/ML IM SOLN
INTRAMUSCULAR | 1 refills | Status: DC
  Filled 2020-12-15: qty 6, 84d supply, fill #0
  Filled 2021-03-11: qty 6, 84d supply, fill #1
  Filled 2021-06-02: qty 6, 84d supply, fill #2

## 2020-12-16 ENCOUNTER — Other Ambulatory Visit: Payer: Self-pay

## 2020-12-16 ENCOUNTER — Encounter (INDEPENDENT_AMBULATORY_CARE_PROVIDER_SITE_OTHER): Payer: Self-pay | Admitting: Physician Assistant

## 2020-12-16 ENCOUNTER — Ambulatory Visit (INDEPENDENT_AMBULATORY_CARE_PROVIDER_SITE_OTHER): Payer: 59 | Admitting: Physician Assistant

## 2020-12-16 ENCOUNTER — Other Ambulatory Visit (HOSPITAL_BASED_OUTPATIENT_CLINIC_OR_DEPARTMENT_OTHER): Payer: Self-pay

## 2020-12-16 VITALS — BP 127/85 | HR 61 | Temp 98.0°F | Ht 74.0 in | Wt 299.0 lb

## 2020-12-16 DIAGNOSIS — R7303 Prediabetes: Secondary | ICD-10-CM

## 2020-12-16 DIAGNOSIS — Z6841 Body Mass Index (BMI) 40.0 and over, adult: Secondary | ICD-10-CM

## 2020-12-16 DIAGNOSIS — E559 Vitamin D deficiency, unspecified: Secondary | ICD-10-CM

## 2020-12-16 DIAGNOSIS — Z9189 Other specified personal risk factors, not elsewhere classified: Secondary | ICD-10-CM | POA: Diagnosis not present

## 2020-12-16 MED ORDER — OZEMPIC (1 MG/DOSE) 4 MG/3ML ~~LOC~~ SOPN
1.0000 mg | PEN_INJECTOR | SUBCUTANEOUS | 0 refills | Status: DC
  Filled 2020-12-16: qty 9, 84d supply, fill #0

## 2020-12-16 MED ORDER — VITAMIN D (ERGOCALCIFEROL) 1.25 MG (50000 UNIT) PO CAPS
ORAL_CAPSULE | ORAL | 0 refills | Status: DC
  Filled 2020-12-16: qty 4, fill #0

## 2020-12-16 NOTE — Progress Notes (Signed)
Chief Complaint:   OBESITY Phillip Roach is here to discuss his progress with his obesity treatment plan along with follow-up of his obesity related diagnoses. Phillip Roach is on the Category 4 Plan and states he is following his eating plan approximately 75% of the time. Phillip Roach states he is not currently exercising.  Today's visit was #: 23 Starting weight: 325 lbs Starting date: 09/23/2020 Today's weight: 299 lbs Today's date: 12/16/2020 Total lbs lost to date: 26 Total lbs lost since last in-office visit: 2  Interim History: Phillip Roach continues to have issues with eating too many carbs. He has not been journaling. He had an occasion where he indulged in coconut cake and ate hot dogs. He states he is not getting enough protein.  Subjective:   1. Pre-diabetes Phillip Roach is on Ozempic 1 mg and feels that the last 2 days prior to his next dose are the hardest. He has not increased the frequency.  2. Vitamin D deficiency Pt is on prescription Vit D and denies nausea, vomiting, and muscle weakness.   3. At risk of diabetes mellitus Phillip Roach is at higher than average risk for developing diabetes due to obesity.   Assessment/Plan:   1. Pre-diabetes Phillip Roach will continue to work on weight loss, exercise, and decreasing simple carbohydrates to help decrease the risk of diabetes. Continue current treatment plan.  Refill- Semaglutide, 1 MG/DOSE, (OZEMPIC, 1 MG/DOSE,) 4 MG/3ML SOPN; Inject 1 mg into the skin once a week.  Dispense: 9 mL; Refill: 0  2. Vitamin D deficiency Low Vitamin D level contributes to fatigue and are associated with obesity, breast, and colon cancer. He agrees to continue to take prescription Vitamin D 50,000 IU every week and will follow-up for routine testing of Vitamin D, at least 2-3 times per year to avoid over-replacement.  Refill- Vitamin D, Ergocalciferol, (DRISDOL) 1.25 MG (50000 UNIT) CAPS capsule; TAKE 1 CAPSULE BY MOUTH EVERY 7 DAYS  Dispense: 4 capsule; Refill: 0  3. At risk  of diabetes mellitus Phillip Roach was given approximately 15 minutes of diabetes education and counseling today. We discussed intensive lifestyle modifications today with an emphasis on weight loss as well as increasing exercise and decreasing simple carbohydrates in his diet. We also reviewed medication options with an emphasis on risk versus benefit of those discussed.   Repetitive spaced learning was employed today to elicit superior memory formation and behavioral change.   4. Obesity with current BMI of 38.37  Phillip Roach is currently in the action stage of change. As such, his goal is to continue with weight loss efforts. He has agreed to keeping a food journal and adhering to recommended goals of 2200-2500 calories and 136 grams protein.   Exercise goals:  As is  Behavioral modification strategies: increasing lean protein intake and decreasing simple carbohydrates.  Phillip Roach has agreed to follow-up with our clinic in 2-3 weeks. He was informed of the importance of frequent follow-up visits to maximize his success with intensive lifestyle modifications for his multiple health conditions.   Objective:   Blood pressure 127/85, pulse 61, temperature 98 F (36.7 C), height 6\' 2"  (1.88 m), weight 299 lb (135.6 kg), SpO2 98 %. Body mass index is 38.39 kg/m.  General: Cooperative, alert, well developed, in no acute distress. HEENT: Conjunctivae and lids unremarkable. Cardiovascular: Regular rhythm.  Lungs: Normal work of breathing. Neurologic: No focal deficits.   Lab Results  Component Value Date   CREATININE 0.85 06/24/2020   BUN 10 06/24/2020   NA 138 06/24/2020  K 4.3 06/24/2020   CL 105 06/24/2020   CO2 25 06/24/2020   Lab Results  Component Value Date   ALT 23 05/15/2020   AST 14 05/15/2020   ALKPHOS 50 05/15/2020   BILITOT 0.5 05/15/2020   Lab Results  Component Value Date   HGBA1C 5.2 10/22/2020   HGBA1C 5.7 (H) 02/25/2020   HGBA1C 5.7 (H) 09/24/2019   Lab Results   Component Value Date   INSULIN 6.5 10/22/2020   INSULIN 9.7 02/25/2020   INSULIN 17.3 09/24/2019   Lab Results  Component Value Date   TSH 0.775 07/16/2019   Lab Results  Component Value Date   CHOL 150 10/22/2020   HDL 33 (L) 10/22/2020   LDLCALC 99 10/22/2020   LDLDIRECT 85.4 07/28/2007   TRIG 98 10/22/2020   CHOLHDL 5 06/27/2012   Lab Results  Component Value Date   VD25OH 41.3 10/22/2020   VD25OH 37.6 02/25/2020   VD25OH 22.2 (L) 07/16/2019   Lab Results  Component Value Date   WBC 8.1 10/22/2020   HGB 15.6 10/22/2020   HCT 46.9 10/22/2020   MCV 86 10/22/2020   PLT 263 10/22/2020    Attestation Statements:   Reviewed by clinician on day of visit: allergies, medications, problem list, medical history, surgical history, family history, social history, and previous encounter notes.  Coral Ceo, CMA, am acting as transcriptionist for [employer], PA-C.  I have reviewed the above documentation for accuracy and completeness, and I agree with the above. Abby Potash, PA-C

## 2020-12-24 DIAGNOSIS — F329 Major depressive disorder, single episode, unspecified: Secondary | ICD-10-CM | POA: Diagnosis not present

## 2021-01-06 ENCOUNTER — Other Ambulatory Visit: Payer: Self-pay

## 2021-01-06 ENCOUNTER — Other Ambulatory Visit (HOSPITAL_BASED_OUTPATIENT_CLINIC_OR_DEPARTMENT_OTHER): Payer: Self-pay

## 2021-01-06 ENCOUNTER — Ambulatory Visit (INDEPENDENT_AMBULATORY_CARE_PROVIDER_SITE_OTHER): Payer: 59 | Admitting: Family Medicine

## 2021-01-06 ENCOUNTER — Encounter (INDEPENDENT_AMBULATORY_CARE_PROVIDER_SITE_OTHER): Payer: Self-pay | Admitting: Family Medicine

## 2021-01-06 VITALS — BP 122/76 | HR 81 | Temp 97.7°F | Ht 74.0 in | Wt 298.0 lb

## 2021-01-06 DIAGNOSIS — E559 Vitamin D deficiency, unspecified: Secondary | ICD-10-CM | POA: Diagnosis not present

## 2021-01-06 DIAGNOSIS — J3089 Other allergic rhinitis: Secondary | ICD-10-CM | POA: Diagnosis not present

## 2021-01-06 DIAGNOSIS — Z6841 Body Mass Index (BMI) 40.0 and over, adult: Secondary | ICD-10-CM | POA: Diagnosis not present

## 2021-01-06 MED ORDER — VITAMIN D (ERGOCALCIFEROL) 1.25 MG (50000 UNIT) PO CAPS
ORAL_CAPSULE | ORAL | 0 refills | Status: DC
  Filled 2021-01-06: qty 4, 28d supply, fill #0

## 2021-01-06 NOTE — Progress Notes (Signed)
Chief Complaint:   OBESITY [name] is here to discuss his progress with his obesity treatment plan along with follow-up of his obesity related diagnoses. Phillip Roach is on keeping a food journal and adhering to recommended goals of 2200-1500 calories and 136 grams protein and states he is following his eating plan approximately 95% of the time. [name] states he is doing housework 120 minutes 3-4 times per week.  Today's visit was #: 24 Starting weight: 325 lbs Starting date: 09/23/2020 Today's weight: 298 lbs Today's date: 01/06/2021 Total lbs lost to date: 27 Total lbs lost since last in-office visit: 1  Interim History: Phillip Roach had a good few weeks journaling. He averaged 104 grams protein for the last 7 days (122 gr the prior week and 117 gr 2 weeks ago). His average calorie intake 2 weeks ago was 1450, one week ago 1830, and less than 1000 cal/day this week. Pt figured out how to change calorie goal on My[employer]itnessPal last week. He has started taking his dog for a walk and then pushing kids in wagon.  Subjective:   1. Vitamin D deficiency Pt denies nausea, vomiting, and muscle weakness but notes fatigue. He is on prescription Vit D. His last Vit D level was 41.3.  2. Allergic rhinitis due to other allergic trigger, unspecified seasonality [name] is on Nasacort and reports improvement in symptoms with meds.  Assessment/Plan:   1. Vitamin D deficiency Low Vitamin D level contributes to fatigue and are associated with obesity, breast, and colon cancer. He agrees to continue to take prescription Vitamin D 50,000 IU every week and will follow-up for routine testing of Vitamin D, at least 2-3 times per year to avoid over-replacement.  Refill- Vitamin D, Ergocalciferol, (DRISDOL) 1.25 MG (50000 UNIT) CAPS capsule; TAKE 1 CAPSULE BY MOUTH EVERY 7 DAYS  Dispense: 4 capsule; Refill: 0  2. Allergic rhinitis due to other allergic trigger, unspecified seasonality Continue current OTC meds.  3. Class 3  severe obesity with serious comorbidity and body mass index (BMI) of 40.0 to 44.9 in adult, unspecified obesity type [facility])  Phillip Roach is currently in the action stage of change. As such, his goal is to continue with weight loss efforts. He has agreed to keeping a food journal and adhering to recommended goals of 2200-2500 calories and 140+ grams protein.   Exercise goals:  As is  Behavioral modification strategies: increasing lean protein intake, meal planning and cooking strategies, and keeping healthy foods in the home.  Phillip Roach has agreed to follow-up with our clinic in 3 weeks. He was informed of the importance of frequent follow-up visits to maximize his success with intensive lifestyle modifications for his multiple health conditions.   Objective:   Blood pressure 122/76, pulse 81, temperature 97.7 F (36.5 C), height 6\' 2"  (1.88 m), weight 298 lb (135.2 kg), SpO2 96 %. Body mass index is 38.26 kg/m.  General: Cooperative, alert, well developed, in no acute distress. HEENT: Conjunctivae and lids unremarkable. Cardiovascular: Regular rhythm.  Lungs: Normal work of breathing. Neurologic: No focal deficits.   Lab Results  Component Value Date   CREATININE 0.85 06/24/2020   BUN 10 06/24/2020   NA 138 06/24/2020   K 4.3 06/24/2020   CL 105 06/24/2020   CO2 25 06/24/2020   Lab Results  Component Value Date   ALT 23 05/15/2020   AST 14 05/15/2020   ALKPHOS 50 05/15/2020   BILITOT 0.5 05/15/2020   Lab Results  Component Value Date   HGBA1C  5.2 10/22/2020   HGBA1C 5.7 (H) 02/25/2020   HGBA1C 5.7 (H) 09/24/2019   Lab Results  Component Value Date   INSULIN 6.5 10/22/2020   INSULIN 9.7 02/25/2020   INSULIN 17.3 09/24/2019   Lab Results  Component Value Date   TSH 0.775 07/16/2019   Lab Results  Component Value Date   CHOL 150 10/22/2020   HDL 33 (L) 10/22/2020   LDLCALC 99 10/22/2020   LDLDIRECT 85.4 07/28/2007   TRIG 98 10/22/2020   CHOLHDL 5 06/27/2012   Lab  Results  Component Value Date   VD25OH 41.3 10/22/2020   VD25OH 37.6 02/25/2020   VD25OH 22.2 (L) 07/16/2019   Lab Results  Component Value Date   WBC 8.1 10/22/2020   HGB 15.6 10/22/2020   HCT 46.9 10/22/2020   MCV 86 10/22/2020   PLT 263 10/22/2020    Attestation Statements:   Reviewed by clinician on day of visit: allergies, medications, problem list, medical history, surgical history, family history, social history, and previous encounter notes.  Coral Ceo, CMA, am acting as transcriptionist for Coralie Common, MD.  I have reviewed the above documentation for accuracy and completeness, and I agree with the above. - Coralie Common, MD

## 2021-01-16 ENCOUNTER — Other Ambulatory Visit (HOSPITAL_BASED_OUTPATIENT_CLINIC_OR_DEPARTMENT_OTHER): Payer: Self-pay

## 2021-01-19 ENCOUNTER — Other Ambulatory Visit: Payer: Self-pay

## 2021-01-20 ENCOUNTER — Ambulatory Visit: Payer: 59 | Admitting: Family Medicine

## 2021-01-20 ENCOUNTER — Encounter: Payer: Self-pay | Admitting: Family Medicine

## 2021-01-20 VITALS — BP 128/80 | HR 90 | Temp 97.4°F | Ht 74.0 in | Wt 297.6 lb

## 2021-01-20 DIAGNOSIS — R7303 Prediabetes: Secondary | ICD-10-CM

## 2021-01-20 DIAGNOSIS — Z5181 Encounter for therapeutic drug level monitoring: Secondary | ICD-10-CM | POA: Diagnosis not present

## 2021-01-20 DIAGNOSIS — F3341 Major depressive disorder, recurrent, in partial remission: Secondary | ICD-10-CM | POA: Diagnosis not present

## 2021-01-20 DIAGNOSIS — E291 Testicular hypofunction: Secondary | ICD-10-CM | POA: Diagnosis not present

## 2021-01-20 LAB — BASIC METABOLIC PANEL
BUN: 13 mg/dL (ref 6–23)
CO2: 25 mEq/L (ref 19–32)
Calcium: 8.9 mg/dL (ref 8.4–10.5)
Chloride: 105 mEq/L (ref 96–112)
Creatinine, Ser: 0.82 mg/dL (ref 0.40–1.50)
GFR: 110.67 mL/min (ref >60.00)
Glucose, Bld: 88 mg/dL (ref 70–99)
Potassium: 4.3 mEq/L (ref 3.5–5.1)
Sodium: 136 mEq/L (ref 135–145)

## 2021-01-20 LAB — CBC
HCT: 48.1 % (ref 39.0–52.0)
Hemoglobin: 16 g/dL (ref 13.0–17.0)
MCHC: 33.2 g/dL (ref 30.0–36.0)
MCV: 86.5 fl (ref 78.0–100.0)
Platelets: 233 10*3/uL (ref 150.0–400.0)
RBC: 5.56 Mil/uL (ref 4.22–5.81)
RDW: 13.6 % (ref 11.5–15.5)
WBC: 8.1 10*3/uL (ref 4.0–10.5)

## 2021-01-20 LAB — HEMOGLOBIN A1C: Hgb A1c MFr Bld: 5.6 % (ref 4.6–6.5)

## 2021-01-20 NOTE — Progress Notes (Signed)
Established Patient Office Visit  Subjective:  Patient ID: Phillip Roach, male    DOB: Feb 09, 1981  Age: 40 y.o. MRN: [id number]  CC:  Chief Complaint  Patient presents with   Follow-up    2 month follow no concerns.     HPI Phillip Roach presents for follow-up of depression, androgen deficiency, prediabetes and medication monitoring.  Has been tolerating the 450 mg dose of Wellbutrin.  Continues with every 2 week testosterone injection.  Feeling better with the Wellbutrin and testosterone.  Continues with weight loss management.  They have him taking the semaglutide twice weekly.  He has been exercising on a regular basis between walking his dog and working in the yard.  Continues to work a regular schedule for Phillip Roach with [employer].  Past Medical History:  Diagnosis Date   ADHD    ADHD (attention deficit hyperactivity disorder)    Asthma    as a child, no inhaler - exercise and seasonal allergies induced   Back pain    Depression    Fatigue    GERD (gastroesophageal reflux disease)    in past , no current problems   Heartburn    Hypertension    in past = no current problems, no meds   Joint pain    Lactose intolerance    Overweight    Pre-diabetes    Sleep apnea    does not use cpap   Vitamin D deficiency     Past Surgical History:  Procedure Laterality Date   FOREHEAD RECONSTRUCTION Right    Right Temple Shrapnel Removed   PAROTIDECTOMY Left 06/24/2020   Procedure: SUPERFICIAL PAROTIDECTOMY WITH FACIAL DISSECTION;  Surgeon: Rozetta Nunnery, MD;  Location: [facility] OR;  Service: ENT;  Laterality: Left;   PILONIDAL CYST DRAINAGE     WISDOM TOOTH EXTRACTION      Family History  Problem Relation Age of Onset   GER disease Mother    Depression Mother    Alcohol abuse Mother    Obesity Mother    Anxiety disorder Father    Hypertension Father    Sleep apnea Father    Obesity Father     Social History   Socioeconomic History   Marital status: Married     Spouse name: Not on file   Number of children: Not on file   Years of education: Not on file   Highest education level: Not on file  Occupational History   Occupation: Phillip Roach  Tobacco Use   Smoking status: Never   Smokeless tobacco: Never  Vaping Use   Vaping Use: Never used  Substance and Sexual Activity   Alcohol use: Yes    Comment: couple times a month   Drug use: No   Sexual activity: Yes  Other Topics Concern   Not on file  Social History Narrative   Married and has baby on the way    Works for Phillip Roach to [employer]   Social Determinants of Radio broadcast assistant Strain: Not on file  Food Insecurity: Not on file  Transportation Needs: Not on file  Physical Activity: Not on file  Stress: Not on file  Social Connections: Not on file  Intimate Partner Violence: Not on file    Outpatient Medications Prior to Visit  Medication Sig Dispense Refill   acetaminophen (TYLENOL) 500 MG tablet Take 1 tablet (500 mg total) by mouth every 6 (six) hours as needed. (Patient taking differently: Take 1,000 mg  by mouth every 6 (six) hours as needed for moderate pain or headache.) 30 tablet 0   b complex vitamins tablet Take 2 tablets by mouth daily.     buPROPion (WELLBUTRIN XL) 150 MG 24 hr tablet Take 3 tablets (450 mg total) by mouth daily. 270 tablet 1   diclofenac Sodium (VOLTAREN) 1 % GEL Apply a small grape sized dollop to tender spot on elbow 4 times daily as needed. 100 g 1   ibuprofen (ADVIL,MOTRIN) 200 MG tablet Take 800 mg by mouth every 6 (six) hours as needed for pain.     loratadine (CLARITIN) 10 MG tablet Take 10 mg by mouth daily.     Semaglutide, 1 MG/DOSE, (OZEMPIC, 1 MG/DOSE,) 4 MG/3ML SOPN Inject 1 mg into the skin once a week. 9 mL 0   testosterone cypionate (DEPOTESTOSTERONE CYPIONATE) 200 MG/ML injection INJECT 1 ML (200 MG TOTAL) INTO THE MUSCLE EVERY 14 (FOURTEEN) DAYS. 10 mL 1   triamcinolone (NASACORT) 55 MCG/ACT AERO nasal inhaler Place 1 spray  into the nose daily.     Vitamin D, Ergocalciferol, (DRISDOL) 1.25 MG (50000 UNIT) CAPS capsule TAKE 1 CAPSULE BY MOUTH EVERY 7 DAYS 4 capsule 0   diazepam (VALIUM) 10 MG tablet Take 1 tablet by mouth 2 hours prior to the procedure and take 1 tablet if needed as directed 2 tablet 0   HYDROcodone-acetaminophen (NORCO/VICODIN) 5-325 MG tablet Take 1 tablet by mouth every 6 hours as needed for pain 8 tablet 0   No facility-administered medications prior to visit.    Allergies  Allergen Reactions   Shellfish Allergy Anaphylaxis    Iodine ok per pt   Keflex [Cephalexin] Rash    ROS Review of Systems  Constitutional:  Negative for diaphoresis, fatigue, fever and unexpected weight change.  HENT:  Positive for congestion, postnasal drip and rhinorrhea.   Eyes:  Negative for photophobia and visual disturbance.  Respiratory: Negative.    Cardiovascular: Negative.   Gastrointestinal: Negative.   Musculoskeletal:  Negative for gait problem and joint swelling.  Neurological:  Negative for speech difficulty and weakness.     Depression screen [facility] 2/9 01/20/2021 11/17/2020 05/15/2020  Decreased Interest 0 2 0  Down, Depressed, Hopeless 0 0 0  PHQ - 2 Score 0 2 0  Altered sleeping - 2 -  Tired, decreased energy - 3 -  Change in appetite - 1 -  Feeling bad or failure about yourself  - 1 -  Trouble concentrating - 1 -  Moving slowly or fidgety/restless - 0 -  Suicidal thoughts - 0 -  PHQ-9 Score - 10 -  Difficult doing work/chores - Somewhat difficult -     Objective:    Physical Exam Vitals and nursing note reviewed.  Constitutional:      General: He is not in acute distress.    Appearance: Normal appearance. He is not ill-appearing, toxic-appearing or diaphoretic.  HENT:     Head: Normocephalic and atraumatic.     Right Ear: Tympanic membrane, ear canal and external ear normal.     Left Ear: Tympanic membrane, ear canal and external ear normal.     Mouth/Throat:     Mouth: Mucous  membranes are moist.     Pharynx: Oropharynx is clear. No oropharyngeal exudate or posterior oropharyngeal erythema.  Eyes:     General: No scleral icterus.       Right eye: No discharge.        Left eye: No discharge.  Extraocular Movements: Extraocular movements intact.     Conjunctiva/sclera: Conjunctivae normal.     Pupils: Pupils are equal, round, and reactive to light.  Neck:     Vascular: No carotid bruit.  Cardiovascular:     Rate and Rhythm: Normal rate and regular rhythm.  Pulmonary:     Effort: Pulmonary effort is normal.     Breath sounds: Normal breath sounds.  Abdominal:     General: Bowel sounds are normal.  Musculoskeletal:     Cervical back: No rigidity or tenderness.  Lymphadenopathy:     Cervical: No cervical adenopathy.  Skin:    General: Skin is warm and dry.  Neurological:     Mental Status: He is alert and oriented to person, place, and time.  Psychiatric:        Mood and Affect: Mood normal.        Behavior: Behavior normal.    BP 128/80 (BP Location: Right Arm, Patient Position: Sitting, Cuff Size: Large)   Pulse 90   Temp (!) 97.4 F (36.3 C)   Ht 6\' 2"  (1.88 m)   Wt 297 lb 9.6 oz (135 kg)   SpO2 96%   BMI 38.21 kg/m  Wt Readings from Last 3 Encounters:  01/20/21 297 lb 9.6 oz (135 kg)  01/06/21 298 lb (135.2 kg)  12/16/20 299 lb (135.6 kg)     Health Maintenance Due  Topic Date Due   HIV Screening  Never done   Hepatitis C Screening  Never done    There are no preventive care reminders to display for this patient.  Lab Results  Component Value Date   TSH 0.775 07/16/2019   Lab Results  Component Value Date   WBC 8.1 10/22/2020   HGB 15.6 10/22/2020   HCT 46.9 10/22/2020   MCV 86 10/22/2020   PLT 263 10/22/2020   Lab Results  Component Value Date   NA 138 06/24/2020   K 4.3 06/24/2020   CO2 25 06/24/2020   GLUCOSE 87 06/24/2020   BUN 10 06/24/2020   CREATININE 0.85 06/24/2020   BILITOT 0.5 05/15/2020   ALKPHOS 50  05/15/2020   AST 14 05/15/2020   ALT 23 05/15/2020   PROT 7.2 05/15/2020   ALBUMIN 4.5 05/15/2020   CALCIUM 9.1 06/24/2020   ANIONGAP 8 06/24/2020   GFR 108.49 05/15/2020   Lab Results  Component Value Date   CHOL 150 10/22/2020   Lab Results  Component Value Date   HDL 33 (L) 10/22/2020   Lab Results  Component Value Date   LDLCALC 99 10/22/2020   Lab Results  Component Value Date   TRIG 98 10/22/2020   Lab Results  Component Value Date   CHOLHDL 5 06/27/2012   Lab Results  Component Value Date   HGBA1C 5.2 10/22/2020      Assessment & Plan:   Problem List Items Addressed This Visit       Endocrine   Androgen deficiency - Primary     Other   Recurrent major depressive disorder, in partial remission ([place])   Prediabetes   Relevant Orders   Basic metabolic panel   Hemoglobin A1c   Other Visit Diagnoses     Medication monitoring encounter       Relevant Orders   CBC       No orders of the defined types were placed in this encounter.   Follow-up: Return in about 6 months (around 07/20/2021).   Continue testosterone replacement and Wellbutrin.  Checking CBC.  Rechecking hemoglobin A1c.  Continue follow-up with weight loss management.  Encouraged continued exercise.  Follow-up in May. [name], MD

## 2021-01-21 DIAGNOSIS — F329 Major depressive disorder, single episode, unspecified: Secondary | ICD-10-CM | POA: Diagnosis not present

## 2021-01-27 ENCOUNTER — Encounter (INDEPENDENT_AMBULATORY_CARE_PROVIDER_SITE_OTHER): Payer: Self-pay | Admitting: Family Medicine

## 2021-01-27 ENCOUNTER — Other Ambulatory Visit (HOSPITAL_BASED_OUTPATIENT_CLINIC_OR_DEPARTMENT_OTHER): Payer: Self-pay

## 2021-01-27 ENCOUNTER — Ambulatory Visit (INDEPENDENT_AMBULATORY_CARE_PROVIDER_SITE_OTHER): Payer: 59 | Admitting: Family Medicine

## 2021-01-27 ENCOUNTER — Other Ambulatory Visit: Payer: Self-pay

## 2021-01-27 VITALS — BP 126/83 | HR 82 | Temp 98.3°F | Ht 74.0 in | Wt 297.0 lb

## 2021-01-27 DIAGNOSIS — E669 Obesity, unspecified: Secondary | ICD-10-CM | POA: Diagnosis not present

## 2021-01-27 DIAGNOSIS — R7303 Prediabetes: Secondary | ICD-10-CM

## 2021-01-27 DIAGNOSIS — E559 Vitamin D deficiency, unspecified: Secondary | ICD-10-CM

## 2021-01-27 MED ORDER — OZEMPIC (1 MG/DOSE) 4 MG/3ML ~~LOC~~ SOPN
1.0000 mg | PEN_INJECTOR | SUBCUTANEOUS | 0 refills | Status: DC
  Filled 2021-01-27 – 2021-02-10 (×3): qty 9, 84d supply, fill #0

## 2021-01-27 MED ORDER — VITAMIN D (ERGOCALCIFEROL) 1.25 MG (50000 UNIT) PO CAPS
ORAL_CAPSULE | ORAL | 0 refills | Status: DC
  Filled 2021-01-27: qty 4, 28d supply, fill #0

## 2021-01-27 NOTE — Progress Notes (Signed)
Chief Complaint:   OBESITY Phillip Roach is here to discuss his progress with his obesity treatment plan along with follow-up of his obesity related diagnoses. Phillip Roach is on keeping a food journal and adhering to recommended goals of 2200-2500 calories and 140 grams protein and states he is following his eating plan approximately 85-90% of the time. Phillip Roach states he is walking and doing house work 4 times per week.  Today's visit was #: 25 Starting weight: 325 lbs Starting date: 09/23/2020 Today's weight: 297 lbs Today's date: 01/27/2021 Total lbs lost to date: 28 Total lbs lost since last in-office visit: 1  Interim History: Over the last few weeks, Phillip Roach has been living his normal home life and work. For Thanksgiving, he is doing a family get together. Pt has been journaling and ending within calorie range, except 2 times and is within 20 grams protein on a daily basis. He recognizes it is difficult to get the quantity of protein in.  Subjective:   1. Pre-diabetes Phillip Roach is on Ozempic 1 mg weekly. His last A1c was 5.6 and insulin level 6.5.  2. Vitamin D deficiency Pt denies nausea, vomiting, and muscle weakness but notes fatigue. He is on prescription Vit D.  Assessment/Plan:   1. Pre-diabetes Phillip Roach will continue to work on weight loss, exercise, and decreasing simple carbohydrates to help decrease the risk of diabetes.   Refill- Semaglutide, 1 MG/DOSE, (OZEMPIC, 1 MG/DOSE,) 4 MG/3ML SOPN; Inject 1 mg into the skin once a week.  Dispense: 9 mL; Refill: 0  2. Vitamin D deficiency Low Vitamin D level contributes to fatigue and are associated with obesity, breast, and colon cancer. He agrees to continue to take prescription Vitamin D 50,000 IU every week and will follow-up for routine testing of Vitamin D, at least 2-3 times per year to avoid over-replacement.  Refill- Vitamin D, Ergocalciferol, (DRISDOL) 1.25 MG (50000 UNIT) CAPS capsule; TAKE 1 CAPSULE BY MOUTH EVERY 7 DAYS  Dispense: 4  capsule; Refill: 0  3. Obesity with current BMI 38.2  Phillip Roach is currently in the action stage of change. As such, his goal is to continue with weight loss efforts. He has agreed to keeping a food journal and adhering to recommended goals of 2200-2500 calories and 140+ grams protein.   Exercise goals:  As is- Pt is to contemplate resistance training activity for January.  Behavioral modification strategies: increasing lean protein intake, meal planning and cooking strategies, keeping healthy foods in the home, and planning for success.  Phillip Roach has agreed to follow-up with our clinic in 3-4 weeks. He was informed of the importance of frequent follow-up visits to maximize his success with intensive lifestyle modifications for his multiple health conditions.   Objective:   Blood pressure 126/83, pulse 82, temperature 98.3 F (36.8 C), height 6\' 2"  (1.88 m), weight 297 lb (134.7 kg), SpO2 98 %. Body mass index is 38.13 kg/m.  General: Cooperative, alert, well developed, in no acute distress. HEENT: Conjunctivae and lids unremarkable. Cardiovascular: Regular rhythm.  Lungs: Normal work of breathing. Neurologic: No focal deficits.   Lab Results  Component Value Date   CREATININE 0.82 01/20/2021   BUN 13 01/20/2021   NA 136 01/20/2021   K 4.3 01/20/2021   CL 105 01/20/2021   CO2 25 01/20/2021   Lab Results  Component Value Date   ALT 23 05/15/2020   AST 14 05/15/2020   ALKPHOS 50 05/15/2020   BILITOT 0.5 05/15/2020   Lab Results  Component Value  Date   HGBA1C 5.6 01/20/2021   HGBA1C 5.2 10/22/2020   HGBA1C 5.7 (H) 02/25/2020   HGBA1C 5.7 (H) 09/24/2019   Lab Results  Component Value Date   INSULIN 6.5 10/22/2020   INSULIN 9.7 02/25/2020   INSULIN 17.3 09/24/2019   Lab Results  Component Value Date   TSH 0.775 07/16/2019   Lab Results  Component Value Date   CHOL 150 10/22/2020   HDL 33 (L) 10/22/2020   LDLCALC 99 10/22/2020   LDLDIRECT 85.4 07/28/2007   TRIG 98  10/22/2020   CHOLHDL 5 06/27/2012   Lab Results  Component Value Date   VD25OH 41.3 10/22/2020   VD25OH 37.6 02/25/2020   VD25OH 22.2 (L) 07/16/2019   Lab Results  Component Value Date   WBC 8.1 01/20/2021   HGB 16.0 01/20/2021   HCT 48.1 01/20/2021   MCV 86.5 01/20/2021   PLT 233.0 01/20/2021    Attestation Statements:   Reviewed by clinician on day of visit: allergies, medications, problem list, medical history, surgical history, family history, social history, and previous encounter notes.  Coral Ceo, CMA, am acting as transcriptionist for Coralie Common, MD.   I have reviewed the above documentation for accuracy and completeness, and I agree with the above. - Coralie Common, MD

## 2021-01-28 ENCOUNTER — Other Ambulatory Visit (HOSPITAL_BASED_OUTPATIENT_CLINIC_OR_DEPARTMENT_OTHER): Payer: Self-pay

## 2021-02-04 ENCOUNTER — Encounter (INDEPENDENT_AMBULATORY_CARE_PROVIDER_SITE_OTHER): Payer: Self-pay | Admitting: Family Medicine

## 2021-02-05 ENCOUNTER — Other Ambulatory Visit (HOSPITAL_BASED_OUTPATIENT_CLINIC_OR_DEPARTMENT_OTHER): Payer: Self-pay

## 2021-02-10 ENCOUNTER — Other Ambulatory Visit (HOSPITAL_BASED_OUTPATIENT_CLINIC_OR_DEPARTMENT_OTHER): Payer: Self-pay

## 2021-02-11 DIAGNOSIS — F329 Major depressive disorder, single episode, unspecified: Secondary | ICD-10-CM | POA: Diagnosis not present

## 2021-02-17 ENCOUNTER — Encounter (INDEPENDENT_AMBULATORY_CARE_PROVIDER_SITE_OTHER): Payer: Self-pay | Admitting: Family Medicine

## 2021-02-17 ENCOUNTER — Other Ambulatory Visit: Payer: Self-pay

## 2021-02-17 ENCOUNTER — Ambulatory Visit (INDEPENDENT_AMBULATORY_CARE_PROVIDER_SITE_OTHER): Payer: 59 | Admitting: Family Medicine

## 2021-02-17 ENCOUNTER — Other Ambulatory Visit (HOSPITAL_BASED_OUTPATIENT_CLINIC_OR_DEPARTMENT_OTHER): Payer: Self-pay

## 2021-02-17 VITALS — BP 129/77 | HR 95 | Temp 97.9°F | Ht 74.0 in | Wt 299.0 lb

## 2021-02-17 DIAGNOSIS — Z6841 Body Mass Index (BMI) 40.0 and over, adult: Secondary | ICD-10-CM | POA: Diagnosis not present

## 2021-02-17 DIAGNOSIS — E559 Vitamin D deficiency, unspecified: Secondary | ICD-10-CM

## 2021-02-17 DIAGNOSIS — R7303 Prediabetes: Secondary | ICD-10-CM

## 2021-02-17 MED ORDER — VITAMIN D (ERGOCALCIFEROL) 1.25 MG (50000 UNIT) PO CAPS
ORAL_CAPSULE | ORAL | 0 refills | Status: DC
  Filled 2021-02-17 – 2021-02-18 (×2): qty 4, 28d supply, fill #0

## 2021-02-17 NOTE — Progress Notes (Signed)
Chief Complaint:   OBESITY Phillip Roach is here to discuss his progress with his obesity treatment plan along with follow-up of his obesity related diagnoses. Phillip Roach is on keeping a food journal and adhering to recommended goals of 2200-2500 calories and 140+ grams protein and states he is following his eating plan approximately 85% of the time. Phillip Roach states he is walking and working in the yard 30-60 minutes 2-3 times per week.  Today's visit was #: 69 Starting weight: 325 lbs Starting date: 09/23/2020 Today's weight: 299 lbs Today's date: 02/17/2021 Total lbs lost to date: 26 Total lbs lost since last in-office visit: 0  Interim History: Phillip Roach had been stressed at home- not necessarily holiday related. The next few weeks, pt's kids are out of school and then Christmas holiday. Food wise, pt reports stress eating increased amounts of carbs and less protein. He has been eating out more frequently.  Subjective:   1. Vitamin D deficiency Pt denies nausea, vomiting, and muscle weakness but notes fatigue. He is on prescription Vit D.  2. Prediabetes Pt's last A1c was 5.6. Insulin level was not done at that time. He is on GLP-1.  Assessment/Plan:   1. Vitamin D deficiency Low Vitamin D level contributes to fatigue and are associated with obesity, breast, and colon cancer. He agrees to continue to take prescription Vitamin D 50,000 IU every week and will follow-up for routine testing of Vitamin D, at least 2-3 times per year to avoid over-replacement.  Refill- Vitamin D, Ergocalciferol, (DRISDOL) 1.25 MG (50000 UNIT) CAPS capsule; TAKE 1 CAPSULE BY MOUTH EVERY 7 DAYS  Dispense: 4 capsule; Refill: 0  2. Prediabetes Phillip Roach will continue to work on weight loss, exercise, and decreasing simple carbohydrates to help decrease the risk of diabetes. Continue Ozempic 1 mg weekly.  3. Obesity with current BMI of 38.5  Phillip Roach is currently in the action stage of change. As such, his goal is to continue  with weight loss efforts. He has agreed to keeping a food journal and adhering to recommended goals of 2200-2500 calories and 140+ grams protein.   Exercise goals: All adults should avoid inactivity. Some physical activity is better than none, and adults who participate in any amount of physical activity gain some health benefits.  Behavioral modification strategies: increasing lean protein intake, meal planning and cooking strategies, keeping healthy foods in the home, and planning for success.  Phillip Roach has agreed to follow-up with our clinic in 3-4 weeks. He was informed of the importance of frequent follow-up visits to maximize his success with intensive lifestyle modifications for his multiple health conditions.   Objective:   Blood pressure 129/77, pulse 95, temperature 97.9 F (36.6 C), height 6\' 2"  (1.88 m), weight 299 lb (135.6 kg), SpO2 97 %. Body mass index is 38.39 kg/m.  General: Cooperative, alert, well developed, in no acute distress. HEENT: Conjunctivae and lids unremarkable. Cardiovascular: Regular rhythm.  Lungs: Normal work of breathing. Neurologic: No focal deficits.   Lab Results  Component Value Date   CREATININE 0.82 01/20/2021   BUN 13 01/20/2021   NA 136 01/20/2021   K 4.3 01/20/2021   CL 105 01/20/2021   CO2 25 01/20/2021   Lab Results  Component Value Date   ALT 23 05/15/2020   AST 14 05/15/2020   ALKPHOS 50 05/15/2020   BILITOT 0.5 05/15/2020   Lab Results  Component Value Date   HGBA1C 5.6 01/20/2021   HGBA1C 5.2 10/22/2020   HGBA1C 5.7 (H) 02/25/2020  HGBA1C 5.7 (H) 09/24/2019   Lab Results  Component Value Date   INSULIN 6.5 10/22/2020   INSULIN 9.7 02/25/2020   INSULIN 17.3 09/24/2019   Lab Results  Component Value Date   TSH 0.775 07/16/2019   Lab Results  Component Value Date   CHOL 150 10/22/2020   HDL 33 (L) 10/22/2020   LDLCALC 99 10/22/2020   LDLDIRECT 85.4 07/28/2007   TRIG 98 10/22/2020   CHOLHDL 5 06/27/2012   Lab  Results  Component Value Date   VD25OH 41.3 10/22/2020   VD25OH 37.6 02/25/2020   VD25OH 22.2 (L) 07/16/2019   Lab Results  Component Value Date   WBC 8.1 01/20/2021   HGB 16.0 01/20/2021   HCT 48.1 01/20/2021   MCV 86.5 01/20/2021   PLT 233.0 01/20/2021    Attestation Statements:   Reviewed by clinician on day of visit: allergies, medications, problem list, medical history, surgical history, family history, social history, and previous encounter notes.  Coral Ceo, CMA, am acting as transcriptionist for Coralie Common, MD.   I have reviewed the above documentation for accuracy and completeness, and I agree with the above. - Coralie Common, MD

## 2021-02-18 ENCOUNTER — Other Ambulatory Visit (HOSPITAL_BASED_OUTPATIENT_CLINIC_OR_DEPARTMENT_OTHER): Payer: Self-pay

## 2021-02-27 ENCOUNTER — Other Ambulatory Visit (HOSPITAL_BASED_OUTPATIENT_CLINIC_OR_DEPARTMENT_OTHER): Payer: Self-pay

## 2021-03-02 DIAGNOSIS — F329 Major depressive disorder, single episode, unspecified: Secondary | ICD-10-CM | POA: Diagnosis not present

## 2021-03-11 ENCOUNTER — Other Ambulatory Visit (HOSPITAL_BASED_OUTPATIENT_CLINIC_OR_DEPARTMENT_OTHER): Payer: Self-pay

## 2021-03-12 ENCOUNTER — Ambulatory Visit (INDEPENDENT_AMBULATORY_CARE_PROVIDER_SITE_OTHER): Payer: 59 | Admitting: Family Medicine

## 2021-03-12 ENCOUNTER — Encounter (INDEPENDENT_AMBULATORY_CARE_PROVIDER_SITE_OTHER): Payer: Self-pay | Admitting: Family Medicine

## 2021-03-12 ENCOUNTER — Other Ambulatory Visit (HOSPITAL_BASED_OUTPATIENT_CLINIC_OR_DEPARTMENT_OTHER): Payer: Self-pay

## 2021-03-12 ENCOUNTER — Other Ambulatory Visit: Payer: Self-pay

## 2021-03-12 VITALS — BP 134/79 | HR 87 | Temp 97.8°F | Ht 74.0 in | Wt 299.0 lb

## 2021-03-12 DIAGNOSIS — R7303 Prediabetes: Secondary | ICD-10-CM

## 2021-03-12 DIAGNOSIS — E559 Vitamin D deficiency, unspecified: Secondary | ICD-10-CM | POA: Diagnosis not present

## 2021-03-12 DIAGNOSIS — Z6838 Body mass index (BMI) 38.0-38.9, adult: Secondary | ICD-10-CM | POA: Diagnosis not present

## 2021-03-12 DIAGNOSIS — Z6841 Body Mass Index (BMI) 40.0 and over, adult: Secondary | ICD-10-CM

## 2021-03-12 MED ORDER — SEMAGLUTIDE (2 MG/DOSE) 8 MG/3ML ~~LOC~~ SOPN
2.0000 mg | PEN_INJECTOR | SUBCUTANEOUS | 0 refills | Status: DC
  Filled 2021-03-12: qty 3, 28d supply, fill #0

## 2021-03-12 MED ORDER — VITAMIN D (ERGOCALCIFEROL) 1.25 MG (50000 UNIT) PO CAPS
ORAL_CAPSULE | ORAL | 0 refills | Status: DC
  Filled 2021-03-12: qty 4, 28d supply, fill #0

## 2021-03-16 NOTE — Progress Notes (Signed)
Chief Complaint:   OBESITY Phillip Roach is here to discuss his progress with his obesity treatment plan along with follow-up of his obesity related diagnoses. Phillip Roach is on keeping a food journal and adhering to recommended goals of 2200-2500 calories and 140+ grams protein and states he is following his eating plan approximately 50% of the time. Phillip Roach states he is walking 1-2 miles 7 times per week.  Today's visit was #: 27 Starting weight: 325 lbs Starting date: 09/23/2020 Today's weight: 299 lbs Today's date: 03/12/2021 Total lbs lost to date: 26 Total lbs lost since last in-office visit: 0  Interim History: Phillip Roach had a low key holiday. He replaced the brakes in his car and feels less stress about money. He is considering applying to nursing school. He did do some stress eating while having car issues. Pt wants to get more consistent and structured with journaling.  Subjective:   1. Prediabetes Phillip Roach is experiencing hunger after 4 days on Ozempic. He denies GI side effects.  2. Vitamin D deficiency Pt denies nausea, vomiting, and muscle weakness but notes fatigue. His last Vit D level was 41.3.  Assessment/Plan:   1. Prediabetes Phillip Roach will increase Ozempic to 2 mg and continue to work on weight loss, exercise, and decreasing simple carbohydrates to help decrease the risk of diabetes.   Increase and Refill- Semaglutide, 2 MG/DOSE, 8 MG/3ML SOPN; Inject 2 mg as directed once a week.  Dispense: 3 mL; Refill: 0  2. Vitamin D deficiency Low Vitamin D level contributes to fatigue and are associated with obesity, breast, and colon cancer. He agrees to continue to take prescription Vitamin D 50,000 IU every week and will follow-up for routine testing of Vitamin D, at least 2-3 times per year to avoid over-replacement.  Refill- Vitamin D, Ergocalciferol, (DRISDOL) 1.25 MG (50000 UNIT) CAPS capsule; TAKE 1 CAPSULE BY MOUTH EVERY 7 DAYS  Dispense: 4 capsule; Refill: 0  3. Obesity with current  BMI of 38.5  Phillip Roach is currently in the action stage of change. As such, his goal is to continue with weight loss efforts. He has agreed to keeping a food journal and adhering to recommended goals of 2200-2500 calories and 140+ grams protein.   Exercise goals: All adults should avoid inactivity. Some physical activity is better than none, and adults who participate in any amount of physical activity gain some health benefits.  Behavioral modification strategies: increasing lean protein intake, meal planning and cooking strategies, keeping healthy foods in the home, and planning for success.  Tommey has agreed to follow-up with our clinic in 3 weeks. He was informed of the importance of frequent follow-up visits to maximize his success with intensive lifestyle modifications for his multiple health conditions.   Objective:   Blood pressure 134/79, pulse 87, temperature 97.8 F (36.6 C), height 6\' 2"  (1.88 m), weight 299 lb (135.6 kg), SpO2 98 %. Body mass index is 38.39 kg/m.  General: Cooperative, alert, well developed, in no acute distress. HEENT: Conjunctivae and lids unremarkable. Cardiovascular: Regular rhythm.  Lungs: Normal work of breathing. Neurologic: No focal deficits.   Lab Results  Component Value Date   CREATININE 0.82 01/20/2021   BUN 13 01/20/2021   NA 136 01/20/2021   K 4.3 01/20/2021   CL 105 01/20/2021   CO2 25 01/20/2021   Lab Results  Component Value Date   ALT 23 05/15/2020   AST 14 05/15/2020   ALKPHOS 50 05/15/2020   BILITOT 0.5 05/15/2020   Lab  Results  Component Value Date   HGBA1C 5.6 01/20/2021   HGBA1C 5.2 10/22/2020   HGBA1C 5.7 (H) 02/25/2020   HGBA1C 5.7 (H) 09/24/2019   Lab Results  Component Value Date   INSULIN 6.5 10/22/2020   INSULIN 9.7 02/25/2020   INSULIN 17.3 09/24/2019   Lab Results  Component Value Date   TSH 0.775 07/16/2019   Lab Results  Component Value Date   CHOL 150 10/22/2020   HDL 33 (L) 10/22/2020   LDLCALC 99  10/22/2020   LDLDIRECT 85.4 07/28/2007   TRIG 98 10/22/2020   CHOLHDL 5 06/27/2012   Lab Results  Component Value Date   VD25OH 41.3 10/22/2020   VD25OH 37.6 02/25/2020   VD25OH 22.2 (L) 07/16/2019   Lab Results  Component Value Date   WBC 8.1 01/20/2021   HGB 16.0 01/20/2021   HCT 48.1 01/20/2021   MCV 86.5 01/20/2021   PLT 233.0 01/20/2021    Attestation Statements:   Reviewed by clinician on day of visit: allergies, medications, problem list, medical history, surgical history, family history, social history, and previous encounter notes.  Coral Ceo, CMA, am acting as transcriptionist for Coralie Common, MD.   I have reviewed the above documentation for accuracy and completeness, and I agree with the above. Coralie Common, MD'

## 2021-03-19 DIAGNOSIS — F329 Major depressive disorder, single episode, unspecified: Secondary | ICD-10-CM | POA: Diagnosis not present

## 2021-04-02 ENCOUNTER — Ambulatory Visit (INDEPENDENT_AMBULATORY_CARE_PROVIDER_SITE_OTHER): Payer: 59 | Admitting: Family Medicine

## 2021-04-06 ENCOUNTER — Ambulatory Visit (INDEPENDENT_AMBULATORY_CARE_PROVIDER_SITE_OTHER): Payer: 59 | Admitting: Family Medicine

## 2021-04-06 ENCOUNTER — Encounter (INDEPENDENT_AMBULATORY_CARE_PROVIDER_SITE_OTHER): Payer: Self-pay | Admitting: Family Medicine

## 2021-04-06 ENCOUNTER — Other Ambulatory Visit: Payer: Self-pay

## 2021-04-06 ENCOUNTER — Other Ambulatory Visit (HOSPITAL_BASED_OUTPATIENT_CLINIC_OR_DEPARTMENT_OTHER): Payer: Self-pay

## 2021-04-06 VITALS — BP 122/72 | HR 78 | Temp 97.9°F | Ht 74.0 in | Wt 298.0 lb

## 2021-04-06 DIAGNOSIS — R7303 Prediabetes: Secondary | ICD-10-CM | POA: Diagnosis not present

## 2021-04-06 DIAGNOSIS — E559 Vitamin D deficiency, unspecified: Secondary | ICD-10-CM | POA: Diagnosis not present

## 2021-04-06 DIAGNOSIS — E669 Obesity, unspecified: Secondary | ICD-10-CM | POA: Diagnosis not present

## 2021-04-06 DIAGNOSIS — Z6838 Body mass index (BMI) 38.0-38.9, adult: Secondary | ICD-10-CM

## 2021-04-06 MED ORDER — VITAMIN D (ERGOCALCIFEROL) 1.25 MG (50000 UNIT) PO CAPS
ORAL_CAPSULE | ORAL | 0 refills | Status: DC
  Filled 2021-04-06 – 2021-04-20 (×2): qty 4, 28d supply, fill #0

## 2021-04-06 NOTE — Progress Notes (Signed)
Chief Complaint:   OBESITY Phillip Roach is here to discuss his progress with his obesity treatment plan along with follow-up of his obesity related diagnoses. Phillip Roach is on keeping a food journal and adhering to recommended goals of 2200-2500 calories and 140+ grams protein and states he is following his eating plan approximately 80% of the time. Phillip Roach states he is walking 1.5 miles 3-4 times per week.  Today's visit was #: 28 Starting weight: 325 lbs Starting date: 09/23/2020 Today's weight: 298 lbs Today's date: 04/06/2021 Total lbs lost to date: 27 Total lbs lost since last in-office visit: 1  Interim History: Pt is living life, per usual- not much has changed. He missed only a few days last few weeks. He did have a cookie day last week. Pt is staying a couple hundred calories under calorie goal. He has increased protein by 5-10% since last appt. Pt still thinks he is deficient 20-30 grams per day. He is trying to incorporate Phillip Roach Steamer packs with protein.  Subjective:   1. Vitamin D deficiency Pt denies nausea, vomiting, and muscle weakness but notes fatigue. He is on prescription Vit D.  2. Prediabetes Pt's last A1c was 5.5 on Phillip Roach 2 mg. The last 3 weeks he has been delayed with 2nd dose. Hunger better controlled.  Assessment/Plan:   1. Vitamin D deficiency Low Vitamin D level contributes to fatigue and are associated with obesity, breast, and colon cancer. He agrees to continue to take prescription Vitamin D 50,000 IU every week and will follow-up for routine testing of Vitamin D, at least 2-3 times per year to avoid over-replacement.  Refill- Vitamin D, Ergocalciferol, (DRISDOL) 1.25 MG (50000 UNIT) CAPS capsule; TAKE 1 CAPSULE BY MOUTH EVERY 7 DAYS  Dispense: 4 capsule; Refill: 0  2. Prediabetes Phillip Roach will continue to work on weight loss, exercise, and decreasing simple carbohydrates to help decrease the risk of diabetes. F/u on compliance at next appt.  3. Obesity  with current BMI of 38.3 Phillip Roach is currently in the action stage of change. As such, his goal is to continue with weight loss efforts. He has agreed to keeping a food journal and adhering to recommended goals of 2200-2500 calories and 140+ grams protein.   Exercise goals:  As is- consistently walking the dog and add in resistance training.  Behavioral modification strategies: increasing lean protein intake, meal planning and cooking strategies, and keeping healthy foods in the home.  Phillip Roach has agreed to follow-up with our clinic in 4 weeks. He was informed of the importance of frequent follow-up visits to maximize his success with intensive lifestyle modifications for his multiple health conditions.   Objective:   Blood pressure 122/72, pulse 78, temperature 97.9 F (36.6 C), height 6\' 2"  (1.88 m), weight 298 lb (135.2 kg), SpO2 97 %. Body mass index is 38.26 kg/m.  General: Cooperative, alert, well developed, in no acute distress. HEENT: Conjunctivae and lids unremarkable. Cardiovascular: Regular rhythm.  Lungs: Normal work of breathing. Neurologic: No focal deficits.   Lab Results  Component Value Date   CREATININE 0.82 01/20/2021   BUN 13 01/20/2021   NA 136 01/20/2021   K 4.3 01/20/2021   CL 105 01/20/2021   CO2 25 01/20/2021   Lab Results  Component Value Date   ALT 23 05/15/2020   AST 14 05/15/2020   ALKPHOS 50 05/15/2020   BILITOT 0.5 05/15/2020   Lab Results  Component Value Date   HGBA1C 5.6 01/20/2021   HGBA1C 5.2  10/22/2020   HGBA1C 5.7 (H) 02/25/2020   HGBA1C 5.7 (H) 09/24/2019   Lab Results  Component Value Date   INSULIN 6.5 10/22/2020   INSULIN 9.7 02/25/2020   INSULIN 17.3 09/24/2019   Lab Results  Component Value Date   TSH 0.775 07/16/2019   Lab Results  Component Value Date   CHOL 150 10/22/2020   HDL 33 (L) 10/22/2020   LDLCALC 99 10/22/2020   LDLDIRECT 85.4 07/28/2007   TRIG 98 10/22/2020   CHOLHDL 5 06/27/2012   Lab Results   Component Value Date   VD25OH 41.3 10/22/2020   VD25OH 37.6 02/25/2020   VD25OH 22.2 (L) 07/16/2019   Lab Results  Component Value Date   WBC 8.1 01/20/2021   HGB 16.0 01/20/2021   HCT 48.1 01/20/2021   MCV 86.5 01/20/2021   PLT 233.0 01/20/2021    Attestation Statements:   Reviewed by clinician on day of visit: allergies, medications, problem list, medical history, surgical history, family history, social history, and previous encounter notes.  Coral Ceo, CMA, am acting as transcriptionist for Coralie Common, MD.   I have reviewed the above documentation for accuracy and completeness, and I agree with the above. - Coralie Common, MD

## 2021-04-13 ENCOUNTER — Other Ambulatory Visit (HOSPITAL_BASED_OUTPATIENT_CLINIC_OR_DEPARTMENT_OTHER): Payer: Self-pay

## 2021-04-15 DIAGNOSIS — F329 Major depressive disorder, single episode, unspecified: Secondary | ICD-10-CM | POA: Diagnosis not present

## 2021-04-20 ENCOUNTER — Other Ambulatory Visit (HOSPITAL_BASED_OUTPATIENT_CLINIC_OR_DEPARTMENT_OTHER): Payer: Self-pay

## 2021-05-04 ENCOUNTER — Ambulatory Visit (INDEPENDENT_AMBULATORY_CARE_PROVIDER_SITE_OTHER): Payer: 59 | Admitting: Family Medicine

## 2021-05-04 DIAGNOSIS — F329 Major depressive disorder, single episode, unspecified: Secondary | ICD-10-CM | POA: Diagnosis not present

## 2021-05-05 ENCOUNTER — Ambulatory Visit (INDEPENDENT_AMBULATORY_CARE_PROVIDER_SITE_OTHER): Payer: 59 | Admitting: Physician Assistant

## 2021-05-05 ENCOUNTER — Encounter (INDEPENDENT_AMBULATORY_CARE_PROVIDER_SITE_OTHER): Payer: Self-pay | Admitting: Physician Assistant

## 2021-05-05 ENCOUNTER — Other Ambulatory Visit (HOSPITAL_BASED_OUTPATIENT_CLINIC_OR_DEPARTMENT_OTHER): Payer: Self-pay

## 2021-05-05 ENCOUNTER — Other Ambulatory Visit: Payer: Self-pay

## 2021-05-05 VITALS — BP 120/66 | HR 107 | Temp 97.9°F | Ht 74.0 in | Wt 301.0 lb

## 2021-05-05 DIAGNOSIS — F3289 Other specified depressive episodes: Secondary | ICD-10-CM | POA: Diagnosis not present

## 2021-05-05 DIAGNOSIS — E669 Obesity, unspecified: Secondary | ICD-10-CM | POA: Diagnosis not present

## 2021-05-05 DIAGNOSIS — Z6838 Body mass index (BMI) 38.0-38.9, adult: Secondary | ICD-10-CM

## 2021-05-05 DIAGNOSIS — R7303 Prediabetes: Secondary | ICD-10-CM | POA: Diagnosis not present

## 2021-05-05 DIAGNOSIS — E559 Vitamin D deficiency, unspecified: Secondary | ICD-10-CM | POA: Diagnosis not present

## 2021-05-05 DIAGNOSIS — Z9189 Other specified personal risk factors, not elsewhere classified: Secondary | ICD-10-CM

## 2021-05-05 MED ORDER — VITAMIN D (ERGOCALCIFEROL) 1.25 MG (50000 UNIT) PO CAPS
ORAL_CAPSULE | ORAL | 0 refills | Status: DC
  Filled 2021-05-05 – 2021-05-12 (×2): qty 4, 28d supply, fill #0

## 2021-05-05 MED ORDER — SEMAGLUTIDE (2 MG/DOSE) 8 MG/3ML ~~LOC~~ SOPN
2.0000 mg | PEN_INJECTOR | SUBCUTANEOUS | 0 refills | Status: DC
  Filled 2021-05-05: qty 3, 28d supply, fill #0

## 2021-05-05 NOTE — Progress Notes (Signed)
Chief Complaint:   OBESITY Burdett is here to discuss his progress with his obesity treatment plan along with follow-up of his obesity related diagnoses. Johnmark is on keeping a food journal and adhering to recommended goals of 2200-2500 calories and 140 grams of protein and states he is following his eating plan approximately 50% of the time. Damont states he is walking for 45-60 minutes 3-4 times per week.  Today's visit was #: 3 Starting weight: 325 lbs Starting date: 09/23/2020 Today's weight: 301 lbs Today's date: 05/05/2021 Total lbs lost to date: 24 lbs Total lbs lost since last in-office visit: 0  Interim History: Siddarth reports recent financial and family issues. He is not journaling consistently. Family birthdays have also been a challenge. Breakfast is his only consistent meal, eating eggs and [place] bacon.   Subjective:   1. Pre-diabetes Recardo's last A1C was 5.6. She is on Ozempic 2 mg which reduces is hunger.   2. Vitamin D deficiency Bhavik is on Vitamin D once weekly.  3. At risk of diabetes mellitus Orlandus is at higher than average risk for developing diabetes due to his obesity.   4. Other depression Huston is on Wellbutrin but states that he feels like his mood has been down. He is going to speak with his primary care physician in 2 days for possible change in medicine.   Assessment/Plan:   1. Pre-diabetes We will refill Ozempic 2 mg for 1 month with no refills. Javi will continue to work on weight loss, exercise, and decreasing simple carbohydrates to help decrease the risk of diabetes.   - Semaglutide, 2 MG/DOSE, 8 MG/3ML SOPN; Inject 2 mg as directed once a week.  Dispense: 3 mL; Refill: 0  2. Vitamin D deficiency Low Vitamin D level contributes to fatigue and are associated with obesity, breast, and colon cancer. We will refill prescription Vitamin D 50,000 IU every week for 1 month with no refills and Roald will follow-up for routine testing of Vitamin D, at  least 2-3 times per year to avoid over-replacement.  - Vitamin D, Ergocalciferol, (DRISDOL) 1.25 MG (50000 UNIT) CAPS capsule; TAKE 1 CAPSULE BY MOUTH EVERY 7 DAYS  Dispense: 4 capsule; Refill: 0  3. At risk of diabetes mellitus Kordell was given approximately 15 minutes of diabetic education and counseling today. We discussed intensive lifestyle modifications today with an emphasis on weight loss as well as increasing exercise and decreasing simple carbohydrates in his diet. We also reviewed medication options with an emphasis on risk versus benefits of those discussed.  Repetitive spaced learning was employed today to elicit superior memory formation and behavioral change.   4. Other depression Gurtaj will follow up with primary care physician. Behavior modification techniques were discussed today to help Gerad deal with his emotional/non-hunger eating behaviors.  Orders and follow up as documented in patient record.   5. Obesity with current BMI of 38.3 Guhan is currently in the action stage of change. As such, his goal is to continue with weight loss efforts. He has agreed to keeping a food journal and adhering to recommended goals of 2200-2500 calories and 140 grams of protein daily.   Exercise goals:  As is.  Behavioral modification strategies: increasing lean protein intake, planning for success, and keeping a strict food journal.  Searcy has agreed to follow-up with our clinic in 2 weeks. He was informed of the importance of frequent follow-up visits to maximize his success with intensive lifestyle modifications for his multiple health  conditions.   Objective:   Blood pressure 120/66, pulse (!) 107, temperature 97.9 F (36.6 C), height 6\' 2"  (1.88 m), weight (!) 301 lb (136.5 kg), SpO2 96 %. Body mass index is 38.65 kg/m.  General: Cooperative, alert, well developed, in no acute distress. HEENT: Conjunctivae and lids unremarkable. Cardiovascular: Regular rhythm.  Lungs: Normal work  of breathing. Neurologic: No focal deficits.   Lab Results  Component Value Date   CREATININE 0.82 01/20/2021   BUN 13 01/20/2021   NA 136 01/20/2021   K 4.3 01/20/2021   CL 105 01/20/2021   CO2 25 01/20/2021   Lab Results  Component Value Date   ALT 23 05/15/2020   AST 14 05/15/2020   ALKPHOS 50 05/15/2020   BILITOT 0.5 05/15/2020   Lab Results  Component Value Date   HGBA1C 5.6 01/20/2021   HGBA1C 5.2 10/22/2020   HGBA1C 5.7 (H) 02/25/2020   HGBA1C 5.7 (H) 09/24/2019   Lab Results  Component Value Date   INSULIN 6.5 10/22/2020   INSULIN 9.7 02/25/2020   INSULIN 17.3 09/24/2019   Lab Results  Component Value Date   TSH 0.775 07/16/2019   Lab Results  Component Value Date   CHOL 150 10/22/2020   HDL 33 (L) 10/22/2020   LDLCALC 99 10/22/2020   LDLDIRECT 85.4 07/28/2007   TRIG 98 10/22/2020   CHOLHDL 5 06/27/2012   Lab Results  Component Value Date   VD25OH 41.3 10/22/2020   VD25OH 37.6 02/25/2020   VD25OH 22.2 (L) 07/16/2019   Lab Results  Component Value Date   WBC 8.1 01/20/2021   HGB 16.0 01/20/2021   HCT 48.1 01/20/2021   MCV 86.5 01/20/2021   PLT 233.0 01/20/2021   No results found for: IRON, TIBC, FERRITIN  Attestation Statements:   Reviewed by clinician on day of visit: allergies, medications, problem list, medical history, surgical history, family history, social history, and previous encounter notes.  I, Tonye Pearson, am acting as Location manager for [employer], PA-C.  I have reviewed the above documentation for accuracy and completeness, and I agree with the above. Abby Potash, PA-C

## 2021-05-07 ENCOUNTER — Ambulatory Visit: Payer: 59 | Admitting: Family Medicine

## 2021-05-07 ENCOUNTER — Other Ambulatory Visit (HOSPITAL_BASED_OUTPATIENT_CLINIC_OR_DEPARTMENT_OTHER): Payer: Self-pay

## 2021-05-07 ENCOUNTER — Encounter: Payer: Self-pay | Admitting: Family Medicine

## 2021-05-07 ENCOUNTER — Other Ambulatory Visit: Payer: Self-pay

## 2021-05-07 VITALS — BP 138/84 | HR 80 | Temp 96.7°F | Ht 74.0 in | Wt 306.4 lb

## 2021-05-07 DIAGNOSIS — Z5181 Encounter for therapeutic drug level monitoring: Secondary | ICD-10-CM | POA: Diagnosis not present

## 2021-05-07 DIAGNOSIS — E291 Testicular hypofunction: Secondary | ICD-10-CM

## 2021-05-07 DIAGNOSIS — F3341 Major depressive disorder, recurrent, in partial remission: Secondary | ICD-10-CM | POA: Diagnosis not present

## 2021-05-07 DIAGNOSIS — R7303 Prediabetes: Secondary | ICD-10-CM

## 2021-05-07 LAB — BASIC METABOLIC PANEL
BUN: 15 mg/dL (ref 6–23)
CO2: 24 mEq/L (ref 19–32)
Calcium: 9.4 mg/dL (ref 8.4–10.5)
Chloride: 104 mEq/L (ref 96–112)
Creatinine, Ser: 0.92 mg/dL (ref 0.40–1.50)
GFR: 104.59 mL/min (ref >60.00)
Glucose, Bld: 85 mg/dL (ref 70–99)
Potassium: 4.2 mEq/L (ref 3.5–5.1)
Sodium: 136 mEq/L (ref 135–145)

## 2021-05-07 LAB — CBC
HCT: 45 % (ref 39.0–52.0)
Hemoglobin: 15.3 g/dL (ref 13.0–17.0)
MCHC: 34 g/dL (ref 30.0–36.0)
MCV: 86.8 fl (ref 78.0–100.0)
Platelets: 224 10*3/uL (ref 150.0–400.0)
RBC: 5.18 Mil/uL (ref 4.22–5.81)
RDW: 12.8 % (ref 11.5–15.5)
WBC: 8.2 10*3/uL (ref 4.0–10.5)

## 2021-05-07 LAB — HEMOGLOBIN A1C: Hgb A1c MFr Bld: 5.1 % (ref 4.6–6.5)

## 2021-05-07 MED ORDER — SERTRALINE HCL 50 MG PO TABS
50.0000 mg | ORAL_TABLET | Freq: Every day | ORAL | 3 refills | Status: DC
  Filled 2021-05-07: qty 30, 30d supply, fill #0
  Filled 2021-06-02: qty 30, 30d supply, fill #1
  Filled 2021-07-07: qty 30, 30d supply, fill #2
  Filled 2021-08-06: qty 30, 30d supply, fill #3

## 2021-05-07 NOTE — Progress Notes (Signed)
Established Patient Office Visit  Subjective:  Patient ID: Phillip Roach, male    DOB: 05/14/1980  Age: 41 y.o. MRN: [id number]  CC:  Chief Complaint  Patient presents with   Depression    Patient feels like depression medication is not working.     HPI Phillip Roach presents for follow-up of depression.  Is feeling that the Wellbutrin is not enough.  He continues to tolerate it well.  He continues with talking therapy every couple weeks.  He is exercising by walking.  Has found himself more likely to go to dark places.His patience is been thin.  His marriage is stressed.  Describes lack of energy increase sleep.  He does experience early morning wakening's difficulty following back asleep  Past Medical History:  Diagnosis Date   ADHD    ADHD (attention deficit hyperactivity disorder)    Asthma    as a child, no inhaler - exercise and seasonal allergies induced   Back pain    Depression    Fatigue    GERD (gastroesophageal reflux disease)    in past , no current problems   Heartburn    Hypertension    in past = no current problems, no meds   Joint pain    Lactose intolerance    Overweight    Pre-diabetes    Sleep apnea    does not use cpap   Vitamin D deficiency     Past Surgical History:  Procedure Laterality Date   FOREHEAD RECONSTRUCTION Right    Right Temple Shrapnel Removed   PAROTIDECTOMY Left 06/24/2020   Procedure: SUPERFICIAL PAROTIDECTOMY WITH FACIAL DISSECTION;  Surgeon: Drema Halon, MD;  Location: [facility] OR;  Service: ENT;  Laterality: Left;   PILONIDAL CYST DRAINAGE     WISDOM TOOTH EXTRACTION      Family History  Problem Relation Age of Onset   GER disease Mother    Depression Mother    Alcohol abuse Mother    Obesity Mother    Anxiety disorder Father    Hypertension Father    Sleep apnea Father    Obesity Father     Social History   Socioeconomic History   Marital status: Married    Spouse name: Not on file   Number of  children: Not on file   Years of education: Not on file   Highest education level: Not on file  Occupational History   Occupation: Paramedic  Tobacco Use   Smoking status: Never   Smokeless tobacco: Never  Vaping Use   Vaping Use: Never used  Substance and Sexual Activity   Alcohol use: Yes    Comment: couple times a month   Drug use: No   Sexual activity: Yes  Other Topics Concern   Not on file  Social History Narrative   Married and has baby on the way    Works for [employer]    Going to [place] school   Social Determinants of Corporate investment banker Strain: Not on file  Food Insecurity: Not on file  Transportation Needs: Not on file  Physical Activity: Not on file  Stress: Not on file  Social Connections: Not on file  Intimate Partner Violence: Not on file    Outpatient Medications Prior to Visit  Medication Sig Dispense Refill   acetaminophen (TYLENOL) 500 MG tablet Take 1 tablet (500 mg total) by mouth every 6 (six) hours as needed. (Patient taking differently: Take 1,000 mg by mouth every 6 (  six) hours as needed for moderate pain or headache.) 30 tablet 0   b complex vitamins tablet Take 2 tablets by mouth daily.     buPROPion (WELLBUTRIN XL) 150 MG 24 hr tablet Take 3 tablets (450 mg total) by mouth daily. 270 tablet 1   diclofenac Sodium (VOLTAREN) 1 % GEL Apply a small grape sized dollop to tender spot on elbow 4 times daily as needed. 100 g 1   ibuprofen (ADVIL,MOTRIN) 200 MG tablet Take 800 mg by mouth every 6 (six) hours as needed for pain.     loratadine (CLARITIN) 10 MG tablet Take 10 mg by mouth daily.     Semaglutide, 2 MG/DOSE, 8 MG/3ML SOPN Inject 2 mg as directed once a week. 3 mL 0   testosterone cypionate (DEPOTESTOSTERONE CYPIONATE) 200 MG/ML injection INJECT 1 ML (200 MG TOTAL) INTO THE MUSCLE EVERY 14 (FOURTEEN) DAYS. 10 mL 1   triamcinolone (NASACORT) 55 MCG/ACT AERO nasal inhaler Place 1 spray into the nose daily.     Vitamin D, Ergocalciferol,  (DRISDOL) 1.25 MG (50000 UNIT) CAPS capsule TAKE 1 CAPSULE BY MOUTH EVERY 7 DAYS 4 capsule 0   No facility-administered medications prior to visit.    Allergies  Allergen Reactions   Shellfish Allergy Anaphylaxis    Iodine ok per pt   Keflex [Cephalexin] Rash    ROS Review of Systems  Constitutional: Negative.   Respiratory: Negative.    Cardiovascular: Negative.   Gastrointestinal: Negative.   Depression screen [facility] 2/9 05/07/2021 01/20/2021 01/20/2021  Decreased Interest 1 0 0  Down, Depressed, Hopeless 1 0 0  PHQ - 2 Score 2 0 0  Altered sleeping - 1 -  Tired, decreased energy - 3 -  Change in appetite - 1 -  Feeling bad or failure about yourself  - 0 -  Trouble concentrating - 1 -  Moving slowly or fidgety/restless - 0 -  Suicidal thoughts - 0 -  PHQ-9 Score - 6 -  Difficult doing work/chores - Not difficult at all -       Objective:    Physical Exam Vitals and nursing note reviewed.  Constitutional:      Appearance: Normal appearance.  HENT:     Head: Normocephalic and atraumatic.  Eyes:     General: No scleral icterus.       Right eye: No discharge.        Left eye: No discharge.     Extraocular Movements: Extraocular movements intact.     Conjunctiva/sclera: Conjunctivae normal.  Pulmonary:     Effort: Pulmonary effort is normal.  Neurological:     Mental Status: He is alert and oriented to person, place, and time.  Psychiatric:        Attention and Perception: Attention and perception normal.        Mood and Affect: Mood normal.        Speech: Speech normal.        Behavior: Behavior normal.        Thought Content: Thought content normal.     Comments: Maintains good eye contact.    BP 138/84 (BP Location: Right Arm, Patient Position: Sitting, Cuff Size: Large)   Pulse 80   Temp (!) 96.7 F (35.9 C) (Temporal)   Ht 6\' 2"  (1.88 m)   Wt (!) 306 lb 6.4 oz (139 kg)   SpO2 96%   BMI 39.34 kg/m  Wt Readings from Last 3 Encounters:  05/07/21 (!)  306 lb 6.4  oz (139 kg)  05/05/21 (!) 301 lb (136.5 kg)  04/06/21 298 lb (135.2 kg)     Health Maintenance Due  Topic Date Due   HIV Screening  Never done   Hepatitis C Screening  Never done    There are no preventive care reminders to display for this patient.  Lab Results  Component Value Date   TSH 0.775 07/16/2019   Lab Results  Component Value Date   WBC 8.1 01/20/2021   HGB 16.0 01/20/2021   HCT 48.1 01/20/2021   MCV 86.5 01/20/2021   PLT 233.0 01/20/2021   Lab Results  Component Value Date   NA 136 01/20/2021   K 4.3 01/20/2021   CO2 25 01/20/2021   GLUCOSE 88 01/20/2021   BUN 13 01/20/2021   CREATININE 0.82 01/20/2021   BILITOT 0.5 05/15/2020   ALKPHOS 50 05/15/2020   AST 14 05/15/2020   ALT 23 05/15/2020   PROT 7.2 05/15/2020   ALBUMIN 4.5 05/15/2020   CALCIUM 8.9 01/20/2021   ANIONGAP 8 06/24/2020   GFR 110.67 01/20/2021   Lab Results  Component Value Date   CHOL 150 10/22/2020   Lab Results  Component Value Date   HDL 33 (L) 10/22/2020   Lab Results  Component Value Date   LDLCALC 99 10/22/2020   Lab Results  Component Value Date   TRIG 98 10/22/2020   Lab Results  Component Value Date   CHOLHDL 5 06/27/2012   Lab Results  Component Value Date   HGBA1C 5.6 01/20/2021      Assessment & Plan:   Problem List Items Addressed This Visit       Endocrine   Androgen deficiency     Other   Recurrent major depressive disorder, in partial remission (HCC)   Relevant Medications   sertraline (ZOLOFT) 50 MG tablet   Prediabetes - Primary   Relevant Orders   Basic metabolic panel   Hemoglobin A1c   Other Visit Diagnoses     Medication monitoring encounter       Relevant Orders   CBC       Meds ordered this encounter  Medications   sertraline (ZOLOFT) 50 MG tablet    Sig: Take 1 tablet (50 mg total) by mouth daily.    Dispense:  30 tablet    Refill:  3    Follow-up: Return in about 2 months (around 07/07/2021), or if  symptoms worsen or fail to improve.  Have added sertraline for augmentation of depression.  We will continue exercising by walking.  We will continue talking therapy.  Information given on sertraline.  Rechecking CBC looking for for cytosis.  Continue quarterly donations of double reds.  Mliss Sax, MD

## 2021-05-12 ENCOUNTER — Other Ambulatory Visit (HOSPITAL_BASED_OUTPATIENT_CLINIC_OR_DEPARTMENT_OTHER): Payer: Self-pay

## 2021-05-20 DIAGNOSIS — F329 Major depressive disorder, single episode, unspecified: Secondary | ICD-10-CM | POA: Diagnosis not present

## 2021-06-01 ENCOUNTER — Encounter (INDEPENDENT_AMBULATORY_CARE_PROVIDER_SITE_OTHER): Payer: Self-pay | Admitting: Family Medicine

## 2021-06-01 ENCOUNTER — Ambulatory Visit (INDEPENDENT_AMBULATORY_CARE_PROVIDER_SITE_OTHER): Payer: 59 | Admitting: Family Medicine

## 2021-06-01 ENCOUNTER — Other Ambulatory Visit: Payer: Self-pay

## 2021-06-01 ENCOUNTER — Other Ambulatory Visit (HOSPITAL_BASED_OUTPATIENT_CLINIC_OR_DEPARTMENT_OTHER): Payer: Self-pay

## 2021-06-01 VITALS — BP 114/75 | HR 75 | Temp 98.2°F | Ht 74.0 in | Wt 300.0 lb

## 2021-06-01 DIAGNOSIS — E559 Vitamin D deficiency, unspecified: Secondary | ICD-10-CM | POA: Diagnosis not present

## 2021-06-01 DIAGNOSIS — E669 Obesity, unspecified: Secondary | ICD-10-CM | POA: Diagnosis not present

## 2021-06-01 DIAGNOSIS — R7303 Prediabetes: Secondary | ICD-10-CM | POA: Diagnosis not present

## 2021-06-01 DIAGNOSIS — Z6838 Body mass index (BMI) 38.0-38.9, adult: Secondary | ICD-10-CM

## 2021-06-01 MED ORDER — VITAMIN D (ERGOCALCIFEROL) 1.25 MG (50000 UNIT) PO CAPS
ORAL_CAPSULE | ORAL | 0 refills | Status: DC
  Filled 2021-06-01 – 2021-06-04 (×2): qty 4, 28d supply, fill #0

## 2021-06-01 MED ORDER — WEGOVY 1.7 MG/0.75ML ~~LOC~~ SOAJ
1.7000 mg | SUBCUTANEOUS | 0 refills | Status: DC
  Filled 2021-06-01: qty 3, 28d supply, fill #0

## 2021-06-02 ENCOUNTER — Other Ambulatory Visit: Payer: Self-pay | Admitting: Family Medicine

## 2021-06-02 ENCOUNTER — Other Ambulatory Visit (HOSPITAL_BASED_OUTPATIENT_CLINIC_OR_DEPARTMENT_OTHER): Payer: Self-pay

## 2021-06-02 DIAGNOSIS — F3341 Major depressive disorder, recurrent, in partial remission: Secondary | ICD-10-CM

## 2021-06-02 MED ORDER — BUPROPION HCL ER (XL) 150 MG PO TB24
450.0000 mg | ORAL_TABLET | Freq: Every day | ORAL | 1 refills | Status: DC
  Filled 2021-06-02: qty 270, 90d supply, fill #0
  Filled 2021-08-30: qty 270, 90d supply, fill #1

## 2021-06-02 NOTE — Telephone Encounter (Signed)
Chart supports rx refill ?Last ov: 05/07/2021 ?Last refill: 02/27/2021 ?

## 2021-06-02 NOTE — Progress Notes (Signed)
? ? ? ?Chief Complaint:  ? ?OBESITY ?Phillip Roach is here to discuss his progress with his obesity treatment plan along with follow-up of his obesity related diagnoses. Phillip Roach is on keeping a food journal and adhering to recommended goals of 2200-2500 calories and 140+ grams of protein daily and states he is following his eating plan approximately 90% of the time. Phillip Roach states he is doing 0 minutes 0 times per week. ? ?Today's visit was #: 30 ?Starting weight: 325 lbs ?Starting date: 09/23/2020 ?Today's weight: 300 lbs ?Today's date: 06/01/2021 ?Total lbs lost to date: 25 ?Total lbs lost since last in-office visit: 1 ? ?Interim History: Phillip Roach has had a few issues with getting in for an appointment. He has had a surge of depressive symptoms over the last few weeks. He was started on Zoloft and he is feeling better. He is doing better on protein. Still noticing sweet cravings. He is going up to [place] for a dinosaur dig with son in a few weeks. He threw his back out last week.  ? ?Subjective:  ? ?1. Vitamin D deficiency ?Phillip Roach's last Vitamin D level was 41.3 on August 2022. He denies nausea, vomiting, or muscle weakness, but notes fatigue. ? ?2. Pre-diabetes ?Phillip Roach was previously on Ozempic, and his last A1c was 5.1. ? ?Assessment/Plan:  ? ?1. Vitamin D deficiency ?We will refill prescription Vitamin D 50,000 IU every week for 1 month. ? ?- Vitamin D, Ergocalciferol, (DRISDOL) 1.25 MG (50000 UNIT) CAPS capsule; TAKE 1 CAPSULE BY MOUTH EVERY 7 DAYS  Dispense: 4 capsule; Refill: 0 ? ?2. Pre-diabetes ?Phillip Roach will stop Ozempic and change to [facility]. He continue to work on weight loss, exercise, and decreasing simple carbohydrates to help decrease the risk of diabetes.  ? ?- Semaglutide-Weight Management (WEGOVY) 1.7 MG/0.75ML SOAJ; Inject 1.7 mg into the skin once a week.  Dispense: 3 mL; Refill: 0 ? ?3. Obesity with current BMI of 38.6 ?Phillip Roach is currently in the action stage of change. As such, his goal is to continue with weight  loss efforts. He has agreed to keeping a food journal and adhering to recommended goals of 2200-2500 calories and 140+ grams of protein daily.  ? ?We discussed various medication options to help Phillip Roach with his weight loss efforts and we both agreed to change to [facility] 1.7 mg SubQ weekly with no refills. ? ?- Semaglutide-Weight Management (WEGOVY) 1.7 MG/0.75ML SOAJ; Inject 1.7 mg into the skin once a week.  Dispense: 3 mL; Refill: 0 ? ?Exercise goals: All adults should avoid inactivity. Some physical activity is better than none, and adults who participate in any amount of physical activity gain some health benefits. ? ?Behavioral modification strategies: increasing lean protein intake, meal planning and cooking strategies, keeping healthy foods in the home, planning for success, and keeping a strict food journal. ? ?Phillip Roach has agreed to follow-up with our clinic in 3 weeks. He was informed of the importance of frequent follow-up visits to maximize his success with intensive lifestyle modifications for his multiple health conditions.  ? ?Objective:  ? ?Blood pressure 114/75, pulse 75, temperature 98.2 ?F (36.8 ?C), height '6\' 2"'$  (1.88 m), weight 300 lb (136.1 kg), SpO2 97 %. ?Body mass index is 38.52 kg/m?. ? ?General: Cooperative, alert, well developed, in no acute distress. ?HEENT: Conjunctivae and lids unremarkable. ?Cardiovascular: Regular rhythm.  ?Lungs: Normal work of breathing. ?Neurologic: No focal deficits.  ? ?Lab Results  ?Component Value Date  ? CREATININE 0.92 05/07/2021  ? BUN 15 05/07/2021  ?  NA 136 05/07/2021  ? K 4.2 05/07/2021  ? CL 104 05/07/2021  ? CO2 24 05/07/2021  ? ?Lab Results  ?Component Value Date  ? ALT 23 05/15/2020  ? AST 14 05/15/2020  ? ALKPHOS 50 05/15/2020  ? BILITOT 0.5 05/15/2020  ? ?Lab Results  ?Component Value Date  ? HGBA1C 5.1 05/07/2021  ? HGBA1C 5.6 01/20/2021  ? HGBA1C 5.2 10/22/2020  ? HGBA1C 5.7 (H) 02/25/2020  ? HGBA1C 5.7 (H) 09/24/2019  ? ?Lab Results  ?Component Value  Date  ? INSULIN 6.5 10/22/2020  ? INSULIN 9.7 02/25/2020  ? INSULIN 17.3 09/24/2019  ? ?Lab Results  ?Component Value Date  ? TSH 0.775 07/16/2019  ? ?Lab Results  ?Component Value Date  ? CHOL 150 10/22/2020  ? HDL 33 (L) 10/22/2020  ? [place] 99 10/22/2020  ? LDLDIRECT 85.4 07/28/2007  ? TRIG 98 10/22/2020  ? CHOLHDL 5 06/27/2012  ? ?Lab Results  ?Component Value Date  ? VD25OH 41.3 10/22/2020  ? VD25OH 37.6 02/25/2020  ? VD25OH 22.2 (L) 07/16/2019  ? ?Lab Results  ?Component Value Date  ? WBC 8.2 05/07/2021  ? HGB 15.3 05/07/2021  ? HCT 45.0 05/07/2021  ? MCV 86.8 05/07/2021  ? PLT 224.0 05/07/2021  ? ?No results found for: IRON, TIBC, FERRITIN ? ?Attestation Statements:  ? ?Reviewed by clinician on day of visit: allergies, medications, problem list, medical history, surgical history, family history, social history, and previous encounter notes. ? ? ?I, Trixie Dredge, am acting as transcriptionist for Coralie Common, MD. ? ?I have reviewed the above documentation for accuracy and completeness, and I agree with the above. Coralie Common, MD ? ? ?

## 2021-06-03 ENCOUNTER — Other Ambulatory Visit (HOSPITAL_BASED_OUTPATIENT_CLINIC_OR_DEPARTMENT_OTHER): Payer: Self-pay

## 2021-06-04 ENCOUNTER — Other Ambulatory Visit (HOSPITAL_BASED_OUTPATIENT_CLINIC_OR_DEPARTMENT_OTHER): Payer: Self-pay

## 2021-06-04 ENCOUNTER — Telehealth (INDEPENDENT_AMBULATORY_CARE_PROVIDER_SITE_OTHER): Payer: Self-pay | Admitting: Family Medicine

## 2021-06-04 ENCOUNTER — Encounter (INDEPENDENT_AMBULATORY_CARE_PROVIDER_SITE_OTHER): Payer: Self-pay

## 2021-06-04 NOTE — Telephone Encounter (Signed)
Prior authorization approved for [facility]. Effective: 06/03/2021 to 09/01/2021. Patient sent approval message via mychart.  ?

## 2021-06-11 DIAGNOSIS — F329 Major depressive disorder, single episode, unspecified: Secondary | ICD-10-CM | POA: Diagnosis not present

## 2021-06-22 DIAGNOSIS — F329 Major depressive disorder, single episode, unspecified: Secondary | ICD-10-CM | POA: Diagnosis not present

## 2021-06-24 ENCOUNTER — Ambulatory Visit (INDEPENDENT_AMBULATORY_CARE_PROVIDER_SITE_OTHER): Payer: 59 | Admitting: Family Medicine

## 2021-06-24 ENCOUNTER — Encounter (INDEPENDENT_AMBULATORY_CARE_PROVIDER_SITE_OTHER): Payer: Self-pay | Admitting: Family Medicine

## 2021-06-24 ENCOUNTER — Other Ambulatory Visit (HOSPITAL_BASED_OUTPATIENT_CLINIC_OR_DEPARTMENT_OTHER): Payer: Self-pay

## 2021-06-24 VITALS — BP 120/67 | HR 74 | Temp 97.5°F | Ht 74.0 in | Wt 299.0 lb

## 2021-06-24 DIAGNOSIS — E559 Vitamin D deficiency, unspecified: Secondary | ICD-10-CM | POA: Diagnosis not present

## 2021-06-24 DIAGNOSIS — Z6838 Body mass index (BMI) 38.0-38.9, adult: Secondary | ICD-10-CM | POA: Diagnosis not present

## 2021-06-24 DIAGNOSIS — R5383 Other fatigue: Secondary | ICD-10-CM

## 2021-06-24 DIAGNOSIS — E669 Obesity, unspecified: Secondary | ICD-10-CM

## 2021-06-24 DIAGNOSIS — R7303 Prediabetes: Secondary | ICD-10-CM | POA: Diagnosis not present

## 2021-06-24 MED ORDER — WEGOVY 1.7 MG/0.75ML ~~LOC~~ SOAJ
1.7000 mg | SUBCUTANEOUS | 0 refills | Status: DC
  Filled 2021-06-24 – 2021-07-23 (×2): qty 3, 28d supply, fill #0

## 2021-06-25 LAB — VITAMIN D 25 HYDROXY (VIT D DEFICIENCY, FRACTURES): Vit D, 25-Hydroxy: 57.1 ng/mL (ref 30.0–100.0)

## 2021-06-25 LAB — TSH: TSH: 2.16 u[IU]/mL (ref 0.450–4.500)

## 2021-06-26 ENCOUNTER — Other Ambulatory Visit: Payer: Self-pay

## 2021-07-05 NOTE — Progress Notes (Signed)
Chief Complaint:   OBESITY Phillip Roach is here to discuss his progress with his obesity treatment plan along with follow-up of his obesity related diagnoses. Phillip Roach is on keeping a food journal and adhering to recommended goals of 2200 to 2500 calories and 140 grams of protein and states he is following his eating plan approximately 90% of the time. Phillip Roach states he is walking at work.  Today's visit was #: 10 Starting weight: 325 lbs Starting date: 09/23/2020 Today's weight: 299 lbs Today's date: 06/24/2021 Total lbs lost to date: 26 lbs Total lbs lost since last in-office visit: 1  Interim History: Phillip Roach has been trying to stay adherent to the plan and is journaling a good amount of time. He did not journal when he was away doing megadig. Phillip Roach doesn't have much planned travel wise for the next few weeks. He wants to still focus on protein intake over the next few weeks.  Subjective:   1. Vitamin D deficiency Phillip Roach is on prescription vitamin D and notes fatigue.  2. Other fatigue Phillip Roach's last TSH was within normal limits. Phillip Roach has noted fatigue since his 1st appointment.  3. Pre-diabetes Phillip Roach last A1c was 5.1 and his Insulin has not been done in the past 3 months.  Assessment/Plan:   1. Vitamin D deficiency Phillip Roach will have his vitamin D level checked today and follow up as directed.  - VITAMIN D 25 Hydroxy (Vit-D Deficiency, Fractures)  2. Other fatigue Phillip Roach will have his TSH checked today and follow up at the agreed upon time.  - TSH  3. Pre-diabetes Phillip Roach will have his Insulin level checked today and follow up as directed.  - Semaglutide-Weight Management (WEGOVY) 1.7 MG/0.75ML SOAJ; Inject 1.7 mg into the skin once a week.  Dispense: 3 mL; Refill: 0  4. Obesity with current BMI of 38.4 Phillip Roach agrees to continue taking Wegovy 1.7 mg and will follow up as directed.  - Semaglutide-Weight Management (WEGOVY) 1.7 MG/0.75ML SOAJ; Inject 1.7 mg into the skin once a week.   Dispense: 3 mL; Refill: 0  Phillip Roach is currently in the action stage of change. As such, his goal is to continue with weight loss efforts. He has agreed to keeping a food journal and adhering to recommended goals of 2200 to 2500 calories and 140+ grams of protein daily.  Exercise goals: All adults should avoid inactivity. Some physical activity is better than none, and adults who participate in any amount of physical activity gain some health benefits. Phillip Roach agrees to add in Phillip Roach.  Behavioral modification strategies: increasing lean protein intake, meal planning and cooking strategies, keeping healthy foods in the home, planning for success, and keep a strict food journal.  Phillip Roach has agreed to follow-up with our clinic in 5 weeks. He was informed of the  importance of frequent follow-up visits to maximize his success with intensive lifestyle modifications for his multiple health conditions.   Phillip Roach was informed we would discuss his lab results at his next visit unless there is a critical issue that needs to be addressed sooner. Phillip Roach agreed to keep his next visit at the agreed upon time to discuss these results.  Objective:   Blood pressure 120/67, pulse 74, temperature (!) 97.5 F (36.4 C), height '6\' 2"'$  (1.88 m), weight 299 lb (135.6 kg), SpO2 98 %. Body mass index is 38.39 kg/m.  General: Cooperative, alert, well developed, in no acute distress. HEENT: Conjunctivae and lids unremarkable. Cardiovascular: Regular rhythm.  Lungs: Normal work of  breathing. Neurologic: No focal deficits.   Lab Results  Component Value Date   CREATININE 0.92 05/07/2021   BUN 15 05/07/2021   NA 136 05/07/2021   K 4.2 05/07/2021   CL 104 05/07/2021   CO2 24 05/07/2021   Lab Results  Component Value Date   ALT 23 05/15/2020   AST 14 05/15/2020   ALKPHOS 50 05/15/2020   BILITOT 0.5 05/15/2020   Lab Results  Component Value Date   HGBA1C 5.1 05/07/2021   HGBA1C 5.6 01/20/2021    HGBA1C 5.2 10/22/2020   HGBA1C 5.7 (H) 02/25/2020   HGBA1C 5.7 (H) 09/24/2019   Lab Results  Component Value Date   INSULIN 6.5 10/22/2020   INSULIN 9.7 02/25/2020   INSULIN 17.3 09/24/2019   Lab Results  Component Value Date   TSH 2.160 06/24/2021   Lab Results  Component Value Date   CHOL 150 10/22/2020   HDL 33 (L) 10/22/2020   LDLCALC 99 10/22/2020   LDLDIRECT 85.4 07/28/2007   TRIG 98 10/22/2020   CHOLHDL 5 06/27/2012   Lab Results  Component Value Date   VD25OH 57.1 06/24/2021   VD25OH 41.3 10/22/2020   VD25OH 37.6 02/25/2020   Lab Results  Component Value Date   WBC 8.2 05/07/2021   HGB 15.3 05/07/2021   HCT 45.0 05/07/2021   MCV 86.8 05/07/2021   PLT 224.0 05/07/2021   No results found for: IRON, TIBC, FERRITIN  Attestation Statements:   Reviewed by clinician on day of visit: allergies, medications, problem list, medical history, surgical history, family history, social history, and previous encounter notes.  IMarcille Blanco, CMA, am acting as transcriptionist for Coralie Common, MD  I have reviewed the above documentation for accuracy and completeness, and I agree with the above. - Coralie Common, MD

## 2021-07-07 ENCOUNTER — Other Ambulatory Visit (HOSPITAL_BASED_OUTPATIENT_CLINIC_OR_DEPARTMENT_OTHER): Payer: Self-pay

## 2021-07-07 DIAGNOSIS — H5213 Myopia, bilateral: Secondary | ICD-10-CM | POA: Diagnosis not present

## 2021-07-15 DIAGNOSIS — F329 Major depressive disorder, single episode, unspecified: Secondary | ICD-10-CM | POA: Diagnosis not present

## 2021-07-23 ENCOUNTER — Other Ambulatory Visit (HOSPITAL_BASED_OUTPATIENT_CLINIC_OR_DEPARTMENT_OTHER): Payer: Self-pay

## 2021-07-28 IMAGING — US US SOFT TISSUE HEAD/NECK
1 series · 9 of 9 positions shown · non-contrast
Comparison: 04/26/2017

CLINICAL DATA: Palpable left neck mass.

EXAM:
ULTRASOUND OF HEAD/NECK SOFT TISSUES
TECHNIQUE: Ultrasound examination of the head and neck soft tissues was
performed in the area of clinical concern.

[Series 1: us soft tissue head/neck · 9 acquisitions, 9 frames shown]
[im 1/9]
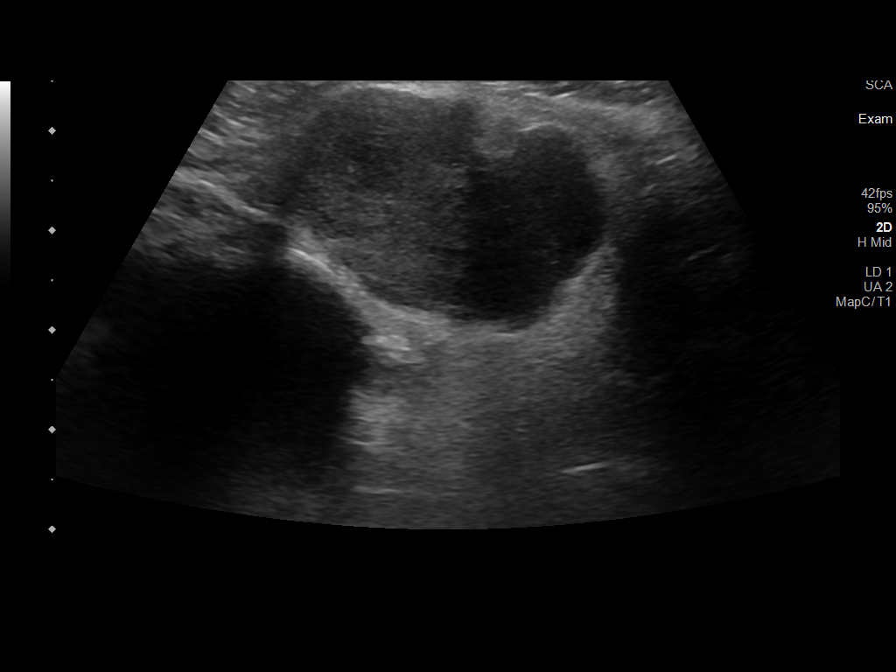
[im 2/9]
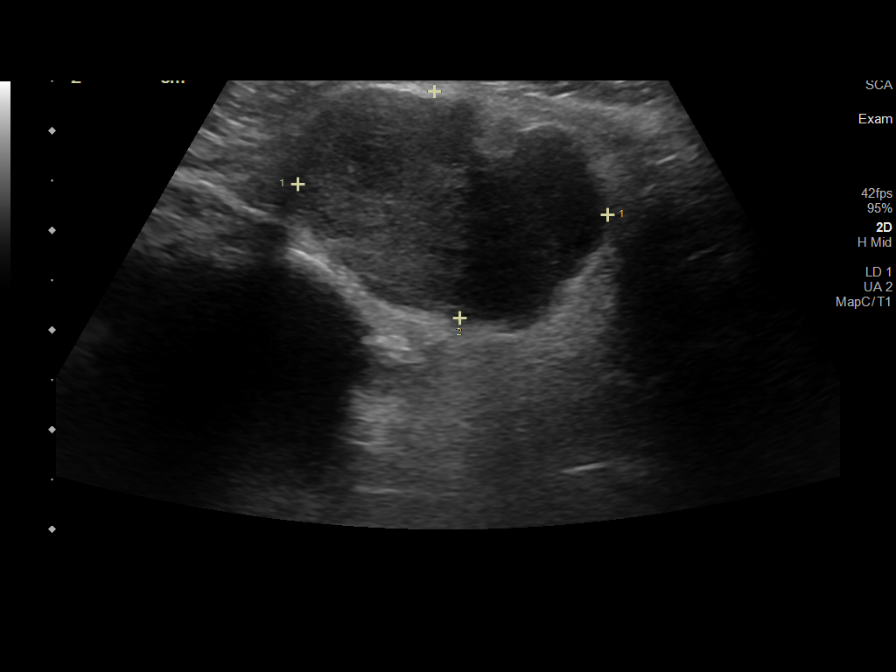
[im 3/9]
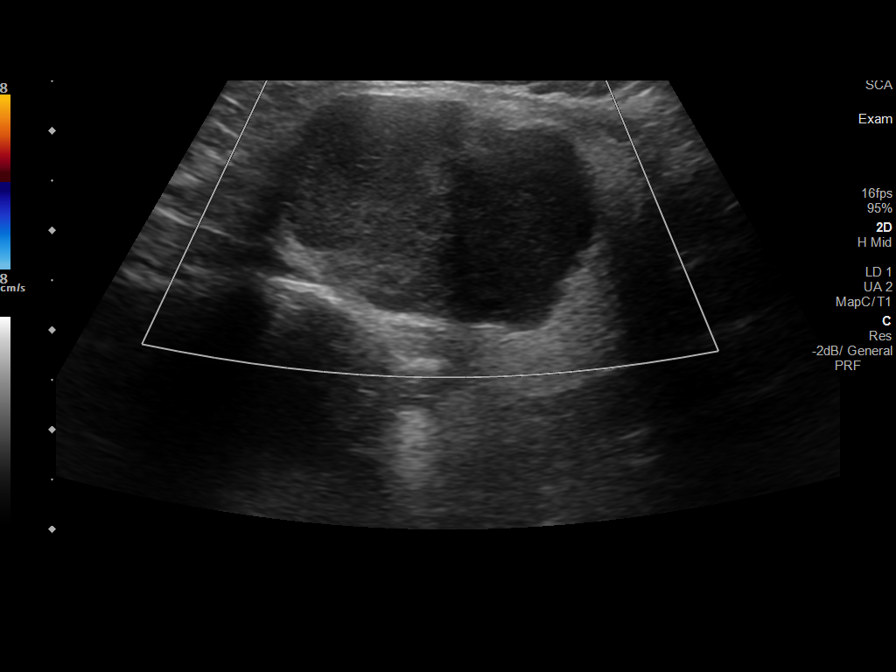
[im 4/9]
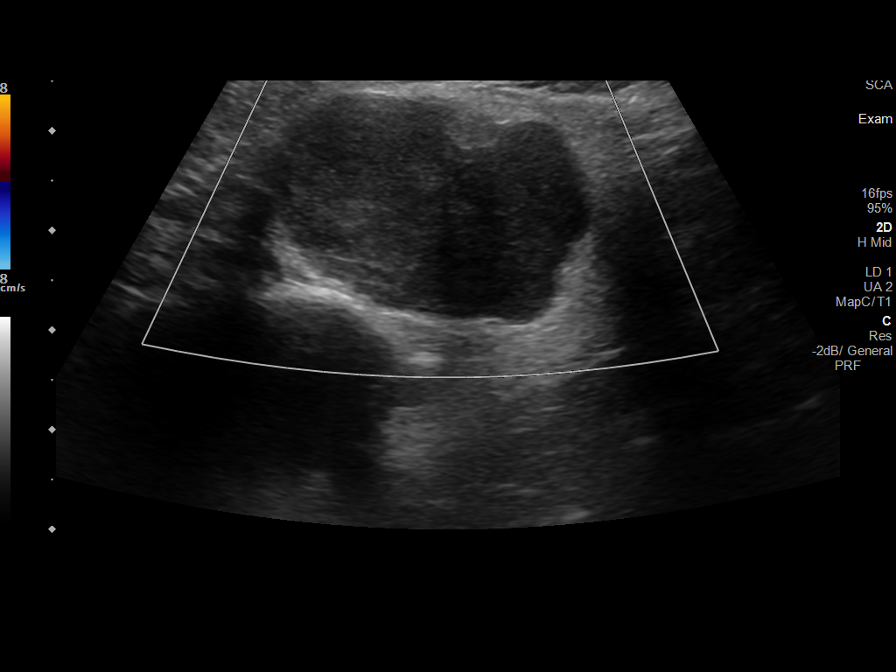
[im 5/9]
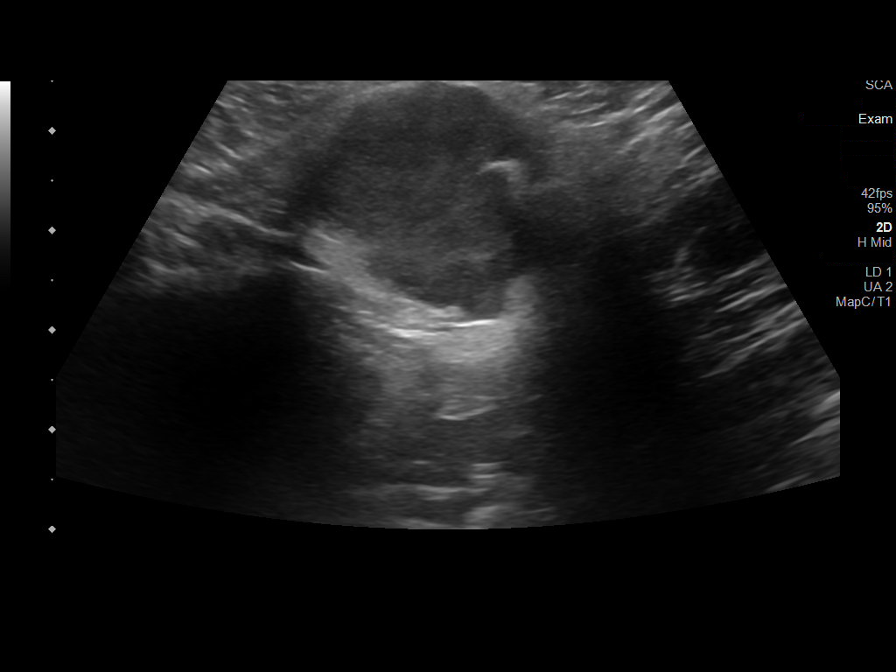
[im 6/9]
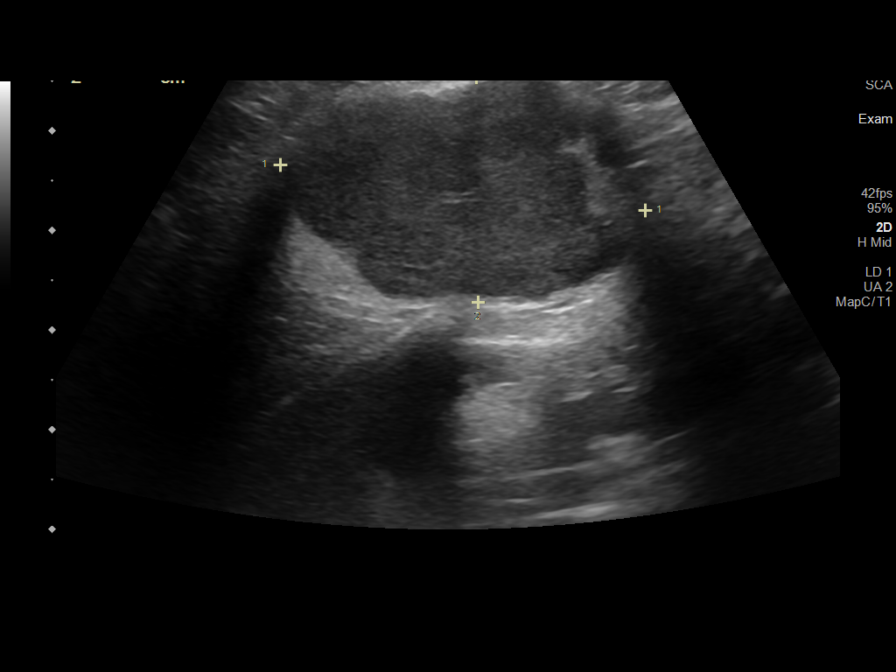
[im 7/9]
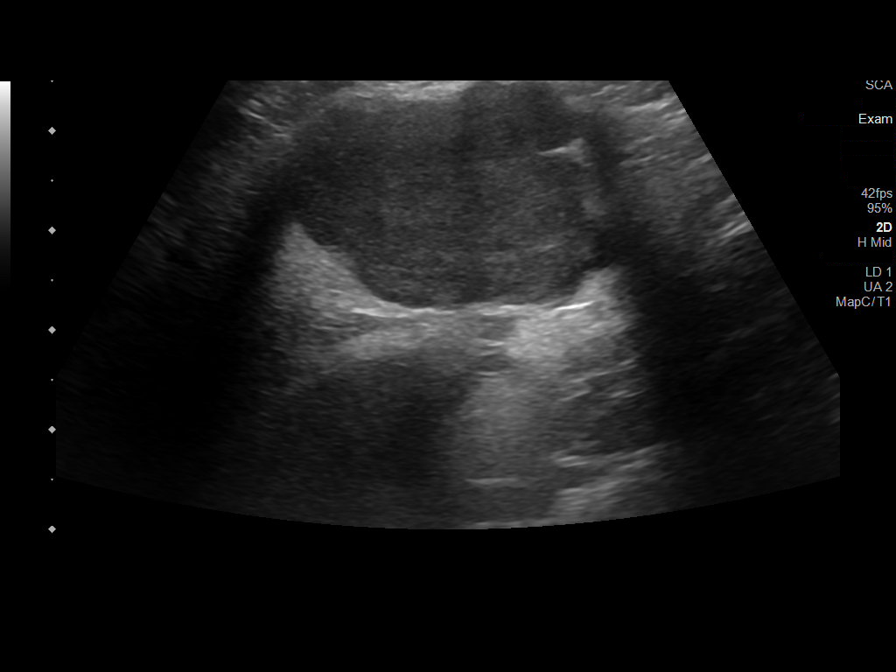
[im 8/9]
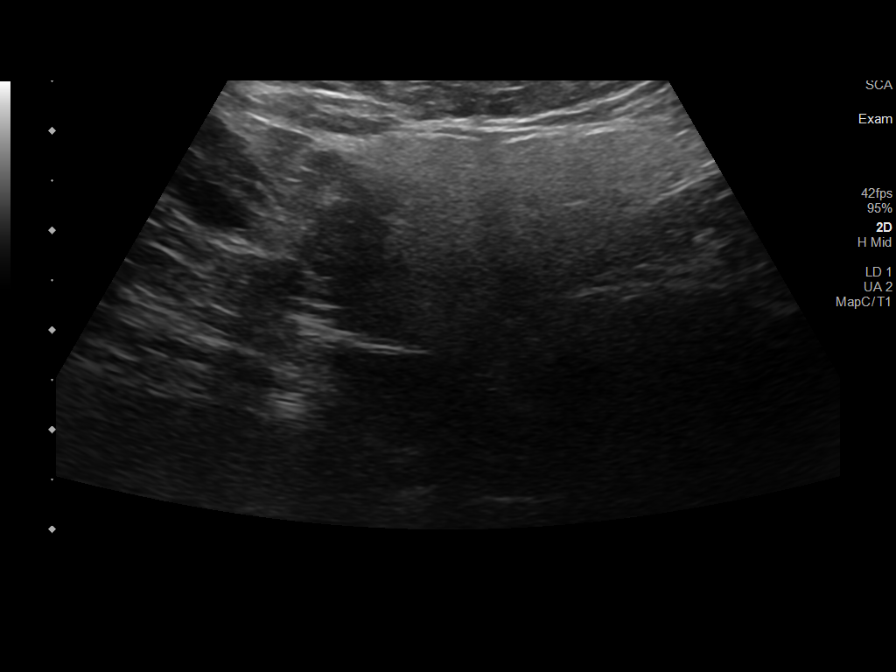
[im 9/9]
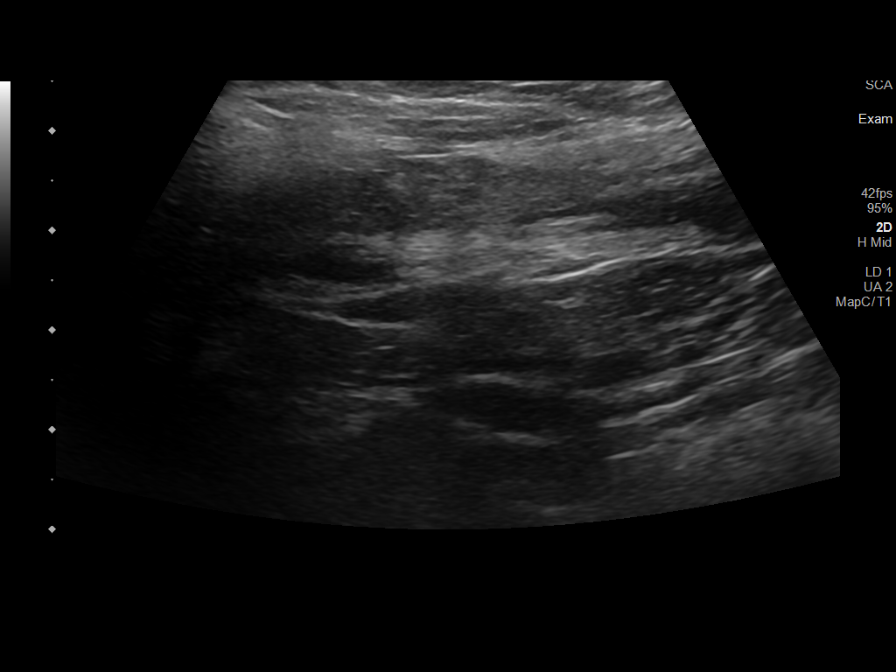

[9 of 9 positions shown; findings below may reference images not displayed]

FINDINGS: The patient's palpable area of concern corresponds to a 3.7 x 2.3 x
3 cm heterogeneous, hypoechoic mass that appears to be centered
within the left parotid gland this mass demonstrates no appreciable
internal color Doppler flow. There is no overlying skin thickening.
IMPRESSION: The patient's palpable area of concern corresponds to a 3.7 cm left
parotid gland mass. Differential considerations include a
pleomorphic adenoma, Warthin's tumor, or pathologically enlarged
parotid lymph node.

## 2021-07-29 ENCOUNTER — Encounter (INDEPENDENT_AMBULATORY_CARE_PROVIDER_SITE_OTHER): Payer: Self-pay | Admitting: Family Medicine

## 2021-07-29 ENCOUNTER — Ambulatory Visit (INDEPENDENT_AMBULATORY_CARE_PROVIDER_SITE_OTHER): Payer: 59 | Admitting: Family Medicine

## 2021-07-29 ENCOUNTER — Other Ambulatory Visit (HOSPITAL_BASED_OUTPATIENT_CLINIC_OR_DEPARTMENT_OTHER): Payer: Self-pay

## 2021-07-29 VITALS — BP 110/75 | HR 79 | Temp 98.0°F | Ht 74.0 in | Wt 294.0 lb

## 2021-07-29 DIAGNOSIS — Z9189 Other specified personal risk factors, not elsewhere classified: Secondary | ICD-10-CM

## 2021-07-29 DIAGNOSIS — E559 Vitamin D deficiency, unspecified: Secondary | ICD-10-CM

## 2021-07-29 DIAGNOSIS — R0602 Shortness of breath: Secondary | ICD-10-CM | POA: Diagnosis not present

## 2021-07-29 DIAGNOSIS — Z7985 Long-term (current) use of injectable non-insulin antidiabetic drugs: Secondary | ICD-10-CM | POA: Diagnosis not present

## 2021-07-29 DIAGNOSIS — Z6837 Body mass index (BMI) 37.0-37.9, adult: Secondary | ICD-10-CM | POA: Diagnosis not present

## 2021-07-29 DIAGNOSIS — R7303 Prediabetes: Secondary | ICD-10-CM | POA: Diagnosis not present

## 2021-07-29 DIAGNOSIS — E669 Obesity, unspecified: Secondary | ICD-10-CM | POA: Diagnosis not present

## 2021-07-29 DIAGNOSIS — E7849 Other hyperlipidemia: Secondary | ICD-10-CM

## 2021-07-29 MED ORDER — WEGOVY 1.7 MG/0.75ML ~~LOC~~ SOAJ
1.7000 mg | SUBCUTANEOUS | 0 refills | Status: DC
  Filled 2021-07-29 – 2021-08-21 (×3): qty 3, 28d supply, fill #0

## 2021-07-30 LAB — INSULIN, RANDOM: INSULIN: 22.4 u[IU]/mL (ref 2.6–24.9)

## 2021-07-30 LAB — LIPID PANEL WITH LDL/HDL RATIO
Cholesterol, Total: 142 mg/dL (ref 100–199)
HDL: 33 mg/dL — ABNORMAL LOW (ref >39)
LDL Chol Calc (NIH): 88 mg/dL (ref 0–99)
LDL/HDL Ratio: 2.7 ratio (ref 0.0–3.6)
Triglycerides: 112 mg/dL (ref 0–149)
VLDL Cholesterol Cal: 21 mg/dL (ref 5–40)

## 2021-08-05 NOTE — Progress Notes (Signed)
Chief Complaint:   OBESITY Phillip Roach is here to discuss his progress with his obesity treatment plan along with follow-up of his obesity related diagnoses. Phillip Roach is on keeping a food journal and adhering to recommended goals of 2200-2500 calories and 140+ grams of protein and states he is following his eating plan approximately 100% of the time. Phillip Roach states he is walking 4,000-8,000 steps 7 times per week.  Today's visit was #: 69 Starting weight: 325 lbs Starting date: 09/23/2020 Today's weight: 294 lbs Today's date: 07/29/2021 Total lbs lost to date: 31 Total lbs lost since last in-office visit: 5  Interim History: Phillip Roach has been doing reasonably with journaling. He does not finish day of journaling consistently. He does not think he is getting protein goal in, often only getting 20-30 grams. Phillip Roach is lactose intolerant so he's limited to what he can take in. He is going away for [employer].  Subjective:   1. Vitamin D deficiency Phillip Roach is currently taking prescription Vit D 50,000 IU once a week. Vit D level of 57.1.  2. Pre-diabetes Phillip Roach's last A1c at 5.1 in March. Last insulin from August 2022.  3. Other hyperlipidemia Phillip Roach's last LDL at goal at 56. He is not on medication.  4. SOBOE (shortness of breath on exertion) Some improvement from initial appointment. 1st IC at 3215.  5. At risk for heart disease Phillip Roach is at higher than average risk for cardiovascular disease due to obesity.  Assessment/Plan:   1. Vitamin D deficiency Phillip Roach will switch Vit D prescription to 50,000 IU daily. [facility], list given.  2. Pre-diabetes We will check insulin level today.  - Insulin, random  3. Other hyperlipidemia We will check FLP today.  - Lipid Panel With LDL/HDL Ratio  4. SOBOE (shortness of breath on exertion) Phillip Roach's IC today at 2779, still in deficit range to see adequate wt loss.  5. At risk for heart disease Phillip Roach was given approximately  15 minutes of coronary artery disease prevention counseling today. He is 41 y.o. male and has risk factors for heart disease including obesity. We discussed intensive lifestyle modifications today with an emphasis on specific weight loss instructions and strategies.  Repetitive spaced learning was employed today to elicit superior memory formation and behavioral change.   6. Obesity with current BMI of 37.7 We will refill Wegovy 1.7 mg subcutaneous once a week for 1 month with no refills. Phillip Roach will continue current protein and calorie goal.  -Refill Semaglutide-Weight Management (WEGOVY) 1.7 MG/0.75ML SOAJ; Inject 1.7 mg into the skin once a week.  Dispense: 3 mL; Refill: 0  Phillip Roach is currently in the action stage of change. As such, his goal is to continue with weight loss efforts. He has agreed to keeping a food journal and adhering to recommended goals of 2200-2500 calories and 140+ grams of  protein daily.  Exercise goals: All adults should avoid inactivity. Some physical activity is better than none, and adults who participate in any amount of physical activity gain some health benefits.  Behavioral modification strategies: increasing lean protein intake, meal planning and cooking strategies, keeping healthy foods in the home, travel eating strategies, and keeping a strict food journal.  Phillip Roach has agreed to follow-up with our clinic in 5 weeks. He was informed of the importance of frequent follow-up visits to maximize his success with intensive lifestyle modifications for his multiple health conditions.    Phillip Roach was informed we would discuss his lab results at his next  visit unless there is a critical issue that needs to be addressed sooner. Phillip Roach agreed to keep his next visit at the agreed upon time to discuss these results.  Objective:   Blood pressure 110/75, pulse 79, temperature 98 F (36.7 C), height '6\' 2"'$  (1.88 m), weight 294 lb (133.4 kg), SpO2 97 %. Body mass index is 37.75  kg/m.  General: Cooperative, alert, well developed, in no acute distress. HEENT: Conjunctivae and lids unremarkable. Cardiovascular: Regular rhythm.  Lungs: Normal work of breathing. Neurologic: No focal deficits.   Lab Results  Component Value Date   CREATININE 0.92 05/07/2021   BUN 15 05/07/2021   NA 136 05/07/2021   K 4.2 05/07/2021   CL 104 05/07/2021   CO2 24 05/07/2021   Lab Results  Component Value Date   ALT 23 05/15/2020   AST 14 05/15/2020   ALKPHOS 50 05/15/2020   BILITOT 0.5 05/15/2020   Lab Results  Component Value Date   HGBA1C 5.1 05/07/2021   HGBA1C 5.6 01/20/2021   HGBA1C 5.2 10/22/2020   HGBA1C 5.7 (H) 02/25/2020   HGBA1C 5.7 (H) 09/24/2019   Lab Results  Component Value Date   INSULIN 22.4 07/29/2021   INSULIN 6.5 10/22/2020   INSULIN 9.7 02/25/2020   INSULIN 17.3 09/24/2019   Lab Results  Component Value Date   TSH 2.160 06/24/2021   Lab Results  Component Value Date   CHOL 142 07/29/2021   HDL 33 (L) 07/29/2021   LDLCALC 88 07/29/2021   LDLDIRECT 85.4 07/28/2007   TRIG 112 07/29/2021   CHOLHDL 5 06/27/2012   Lab Results  Component Value Date   VD25OH 57.1 06/24/2021   VD25OH 41.3 10/22/2020   VD25OH 37.6 02/25/2020   Lab Results  Component Value Date   WBC 8.2 05/07/2021   HGB 15.3 05/07/2021   HCT 45.0 05/07/2021   MCV 86.8 05/07/2021   PLT 224.0 05/07/2021   No results found for: IRON, TIBC, FERRITIN  Attestation Statements:   Reviewed by clinician on day of visit: allergies, medications, problem list, medical history, surgical history, family history, social history, and previous encounter notes.  I, Elnora Morrison, RMA am acting as transcriptionist for Coralie Common, MD.  I have reviewed the above documentation for accuracy and completeness, and I agree with the above. - Coralie Common, MD

## 2021-08-06 ENCOUNTER — Other Ambulatory Visit (HOSPITAL_BASED_OUTPATIENT_CLINIC_OR_DEPARTMENT_OTHER): Payer: Self-pay

## 2021-08-10 DIAGNOSIS — F329 Major depressive disorder, single episode, unspecified: Secondary | ICD-10-CM | POA: Diagnosis not present

## 2021-08-19 ENCOUNTER — Other Ambulatory Visit (HOSPITAL_BASED_OUTPATIENT_CLINIC_OR_DEPARTMENT_OTHER): Payer: Self-pay

## 2021-08-19 ENCOUNTER — Other Ambulatory Visit: Payer: Self-pay | Admitting: Family Medicine

## 2021-08-19 DIAGNOSIS — E291 Testicular hypofunction: Secondary | ICD-10-CM

## 2021-08-20 ENCOUNTER — Other Ambulatory Visit (HOSPITAL_BASED_OUTPATIENT_CLINIC_OR_DEPARTMENT_OTHER): Payer: Self-pay

## 2021-08-20 MED ORDER — TESTOSTERONE CYPIONATE 200 MG/ML IM SOLN
INTRAMUSCULAR | 1 refills | Status: DC
  Filled 2021-08-20: qty 6, 84d supply, fill #0
  Filled 2021-11-27 (×2): qty 3, 42d supply, fill #1

## 2021-08-21 ENCOUNTER — Other Ambulatory Visit (HOSPITAL_BASED_OUTPATIENT_CLINIC_OR_DEPARTMENT_OTHER): Payer: Self-pay

## 2021-08-24 ENCOUNTER — Other Ambulatory Visit (HOSPITAL_BASED_OUTPATIENT_CLINIC_OR_DEPARTMENT_OTHER): Payer: Self-pay

## 2021-08-25 ENCOUNTER — Other Ambulatory Visit (HOSPITAL_BASED_OUTPATIENT_CLINIC_OR_DEPARTMENT_OTHER): Payer: Self-pay

## 2021-08-26 ENCOUNTER — Other Ambulatory Visit (HOSPITAL_BASED_OUTPATIENT_CLINIC_OR_DEPARTMENT_OTHER): Payer: Self-pay

## 2021-08-27 ENCOUNTER — Other Ambulatory Visit (HOSPITAL_BASED_OUTPATIENT_CLINIC_OR_DEPARTMENT_OTHER): Payer: Self-pay

## 2021-08-28 ENCOUNTER — Other Ambulatory Visit (HOSPITAL_BASED_OUTPATIENT_CLINIC_OR_DEPARTMENT_OTHER): Payer: Self-pay

## 2021-08-30 ENCOUNTER — Other Ambulatory Visit: Payer: Self-pay | Admitting: Family Medicine

## 2021-08-30 DIAGNOSIS — F3341 Major depressive disorder, recurrent, in partial remission: Secondary | ICD-10-CM

## 2021-08-31 ENCOUNTER — Telehealth: Payer: Self-pay | Admitting: Family Medicine

## 2021-08-31 ENCOUNTER — Encounter (INDEPENDENT_AMBULATORY_CARE_PROVIDER_SITE_OTHER): Payer: Self-pay | Admitting: Family Medicine

## 2021-08-31 ENCOUNTER — Ambulatory Visit (INDEPENDENT_AMBULATORY_CARE_PROVIDER_SITE_OTHER): Payer: 59 | Admitting: Family Medicine

## 2021-08-31 ENCOUNTER — Other Ambulatory Visit (HOSPITAL_BASED_OUTPATIENT_CLINIC_OR_DEPARTMENT_OTHER): Payer: Self-pay

## 2021-08-31 VITALS — BP 103/58 | HR 72 | Temp 97.6°F | Ht 74.0 in | Wt 298.0 lb

## 2021-08-31 DIAGNOSIS — F3289 Other specified depressive episodes: Secondary | ICD-10-CM | POA: Diagnosis not present

## 2021-08-31 DIAGNOSIS — E669 Obesity, unspecified: Secondary | ICD-10-CM

## 2021-08-31 DIAGNOSIS — E559 Vitamin D deficiency, unspecified: Secondary | ICD-10-CM | POA: Diagnosis not present

## 2021-08-31 DIAGNOSIS — Z9189 Other specified personal risk factors, not elsewhere classified: Secondary | ICD-10-CM

## 2021-08-31 DIAGNOSIS — Z6838 Body mass index (BMI) 38.0-38.9, adult: Secondary | ICD-10-CM | POA: Diagnosis not present

## 2021-08-31 MED ORDER — BUPROPION HCL ER (XL) 150 MG PO TB24
450.0000 mg | ORAL_TABLET | Freq: Every day | ORAL | 1 refills | Status: DC
  Filled 2021-08-31: qty 270, 90d supply, fill #0

## 2021-08-31 MED ORDER — SERTRALINE HCL 50 MG PO TABS
50.0000 mg | ORAL_TABLET | Freq: Every day | ORAL | 1 refills | Status: DC
  Filled 2021-08-31: qty 90, 90d supply, fill #0

## 2021-08-31 MED ORDER — SERTRALINE HCL 50 MG PO TABS
50.0000 mg | ORAL_TABLET | Freq: Every day | ORAL | 1 refills | Status: DC
  Filled 2021-11-27: qty 90, 90d supply, fill #0

## 2021-08-31 MED ORDER — WEGOVY 1.7 MG/0.75ML ~~LOC~~ SOAJ
1.7000 mg | SUBCUTANEOUS | 0 refills | Status: DC
  Filled 2021-08-31 – 2021-09-13 (×2): qty 3, 28d supply, fill #0

## 2021-08-31 NOTE — Telephone Encounter (Signed)
 Patient aware Rx sent in

## 2021-09-01 ENCOUNTER — Telehealth: Payer: Self-pay | Admitting: Family Medicine

## 2021-09-01 ENCOUNTER — Other Ambulatory Visit (HOSPITAL_BASED_OUTPATIENT_CLINIC_OR_DEPARTMENT_OTHER): Payer: Self-pay

## 2021-09-01 NOTE — Progress Notes (Signed)
Chief Complaint:   OBESITY Phillip Roach is here to discuss his progress with his obesity treatment plan along with follow-up of his obesity related diagnoses. Phillip Roach is on keeping a food journal and adhering to recommended goals of 2200-2500 calories and 140+ grams of protein and states he is following his eating plan approximately 85% of the time. Phillip Roach states he is walking 1 mile 7 times per week.  Today's visit was #: 80 Starting weight: 325 lbs Starting date: 09/23/2020 Today's weight: 298 lbs Today's date: 08/31/2021 Total lbs lost to date: 27 lbs Total lbs lost since last in-office visit: 0  Interim History: Phillip Roach just returned from [place] on vacation. Weather was relatively good while away. No plans for July 4th. He wants to increase protein over next few weeks. Does recognize he did take more sugar in over last week.  Subjective:   1. Vitamin D deficiency Phillip Roach is not on Vit D. Denies any nausea, vomiting or muscle weakness. He notes fatigue.  2. Other depression Phillip Roach is currently on Zoloft and Wellbutrin. Denies suicidal ideas, and homicidal ideas. His symptoms well managed.  3. At risk for side effect of medication Phillip Roach is at risk for drug side effects due to medication.  Assessment/Plan:   1. Vitamin D deficiency We will check Vit D level in Aug.  2. Other depression We will refill Wellbutrin XL 150 mg by mouth daily for 3 months with 0 refills.  -Refill buPROPion (WELLBUTRIN XL) 150 MG 24 hr tablet; Take 3 tablets (450 mg total) by mouth daily.  Dispense: 270 tablet; Refill: 1  3. At risk for side effect of medication Phillip Roach was given approximately 15 minutes of drug side effect counseling today.  We discussed side effect possibility and risk versus benefits. Kamaron agreed to the medication and will contact this office if these side effects are intolerable.  Repetitive spaced learning was employed today to elicit superior memory formation and behavioral  change.  4. Obesity with current BMI of 38.3 We will refill Wegovy 1.7 mg SubQ once weekly for 1 month with 0 refills.  -Refill Semaglutide-Weight Management (WEGOVY) 1.7 MG/0.75ML SOAJ; Inject 1.7 mg into the skin once a week.  Dispense: 3 mL; Refill: 0  Phillip Roach is currently in the action stage of change. As such, his goal is to continue with weight loss efforts. He has agreed to keeping a food journal and adhering to recommended goals of 2200-2900 calories and 140+ grams of protein daily.   Exercise goals: All adults should avoid inactivity. Some physical activity is better than none, and adults who participate in any amount of physical activity gain some health benefits.  Behavioral modification strategies: increasing lean protein intake, meal planning and cooking strategies, keeping healthy foods in the home, and planning for success.  Phillip Roach has agreed to follow-up with our clinic in 4 weeks. He was informed of the importance of frequent follow-up visits to maximize his success with intensive lifestyle modifications for his multiple health conditions.   Objective:   Blood pressure (!) 103/58, pulse 72, temperature 97.6 F (36.4 C), height '6\' 2"'$  (1.88 m), weight 298 lb (135.2 kg), SpO2 97 %. Body mass index is 38.26 kg/m.  General: Cooperative, alert, well developed, in no acute distress. HEENT: Conjunctivae and lids unremarkable. Cardiovascular: Regular rhythm.  Lungs: Normal work of breathing. Neurologic: No focal deficits.   Lab Results  Component Value Date   CREATININE 0.92 05/07/2021   BUN 15 05/07/2021   NA 136  05/07/2021   K 4.2 05/07/2021   CL 104 05/07/2021   CO2 24 05/07/2021   Lab Results  Component Value Date   ALT 23 05/15/2020   AST 14 05/15/2020   ALKPHOS 50 05/15/2020   BILITOT 0.5 05/15/2020   Lab Results  Component Value Date   HGBA1C 5.1 05/07/2021   HGBA1C 5.6 01/20/2021   HGBA1C 5.2 10/22/2020   HGBA1C 5.7 (H) 02/25/2020   HGBA1C 5.7 (H)  09/24/2019   Lab Results  Component Value Date   INSULIN 22.4 07/29/2021   INSULIN 6.5 10/22/2020   INSULIN 9.7 02/25/2020   INSULIN 17.3 09/24/2019   Lab Results  Component Value Date   TSH 2.160 06/24/2021   Lab Results  Component Value Date   CHOL 142 07/29/2021   HDL 33 (L) 07/29/2021   LDLCALC 88 07/29/2021   LDLDIRECT 85.4 07/28/2007   TRIG 112 07/29/2021   CHOLHDL 5 06/27/2012   Lab Results  Component Value Date   VD25OH 57.1 06/24/2021   VD25OH 41.3 10/22/2020   VD25OH 37.6 02/25/2020   Lab Results  Component Value Date   WBC 8.2 05/07/2021   HGB 15.3 05/07/2021   HCT 45.0 05/07/2021   MCV 86.8 05/07/2021   PLT 224.0 05/07/2021   No results found for: "IRON", "TIBC", "FERRITIN"  Attestation Statements:   Reviewed by clinician on day of visit: allergies, medications, problem list, medical history, surgical history, family history, social history, and previous encounter notes.  I, Elnora Morrison, RMA am acting as transcriptionist for Coralie Common, MD.  I have reviewed the above documentation for accuracy and completeness, and I agree with the above. - Coralie Common, MD

## 2021-09-02 ENCOUNTER — Other Ambulatory Visit (HOSPITAL_BASED_OUTPATIENT_CLINIC_OR_DEPARTMENT_OTHER): Payer: Self-pay

## 2021-09-03 ENCOUNTER — Other Ambulatory Visit (HOSPITAL_BASED_OUTPATIENT_CLINIC_OR_DEPARTMENT_OTHER): Payer: Self-pay

## 2021-09-03 DIAGNOSIS — F329 Major depressive disorder, single episode, unspecified: Secondary | ICD-10-CM | POA: Diagnosis not present

## 2021-09-03 NOTE — Telephone Encounter (Signed)
PA for Testosterone submitted through cover may meds to [place]. Awaiting  response.  Dm/cma   Key: [id number]

## 2021-09-04 ENCOUNTER — Other Ambulatory Visit (HOSPITAL_BASED_OUTPATIENT_CLINIC_OR_DEPARTMENT_OTHER): Payer: Self-pay

## 2021-09-04 NOTE — Telephone Encounter (Signed)
PA approved from 09/03/21 - 09/03/22. Pharmacy and patient notified phone.  Dm/cma

## 2021-09-14 ENCOUNTER — Other Ambulatory Visit (HOSPITAL_BASED_OUTPATIENT_CLINIC_OR_DEPARTMENT_OTHER): Payer: Self-pay

## 2021-09-15 ENCOUNTER — Other Ambulatory Visit (HOSPITAL_BASED_OUTPATIENT_CLINIC_OR_DEPARTMENT_OTHER): Payer: Self-pay

## 2021-09-16 ENCOUNTER — Other Ambulatory Visit (HOSPITAL_BASED_OUTPATIENT_CLINIC_OR_DEPARTMENT_OTHER): Payer: Self-pay

## 2021-09-16 ENCOUNTER — Telehealth (INDEPENDENT_AMBULATORY_CARE_PROVIDER_SITE_OTHER): Payer: Self-pay | Admitting: Family Medicine

## 2021-09-16 ENCOUNTER — Encounter (INDEPENDENT_AMBULATORY_CARE_PROVIDER_SITE_OTHER): Payer: Self-pay

## 2021-09-16 NOTE — Telephone Encounter (Signed)
Dr. Jearld Shines - Prior authorization approved for [id number]. Effective: 09/16/2021 to 09/16/2022. Patient sent approval message via mychart.

## 2021-09-22 DIAGNOSIS — F329 Major depressive disorder, single episode, unspecified: Secondary | ICD-10-CM | POA: Diagnosis not present

## 2021-09-28 ENCOUNTER — Ambulatory Visit (INDEPENDENT_AMBULATORY_CARE_PROVIDER_SITE_OTHER): Payer: 59 | Admitting: Family Medicine

## 2021-09-29 ENCOUNTER — Ambulatory Visit (INDEPENDENT_AMBULATORY_CARE_PROVIDER_SITE_OTHER): Payer: 59 | Admitting: Family Medicine

## 2021-09-29 ENCOUNTER — Encounter (INDEPENDENT_AMBULATORY_CARE_PROVIDER_SITE_OTHER): Payer: Self-pay | Admitting: Family Medicine

## 2021-09-29 VITALS — BP 123/75 | HR 74 | Temp 97.8°F | Ht 74.0 in | Wt 302.0 lb

## 2021-09-29 DIAGNOSIS — F32A Depression, unspecified: Secondary | ICD-10-CM | POA: Insufficient documentation

## 2021-09-29 DIAGNOSIS — E669 Obesity, unspecified: Secondary | ICD-10-CM | POA: Diagnosis not present

## 2021-09-29 DIAGNOSIS — F3289 Other specified depressive episodes: Secondary | ICD-10-CM

## 2021-09-29 DIAGNOSIS — E559 Vitamin D deficiency, unspecified: Secondary | ICD-10-CM

## 2021-09-29 DIAGNOSIS — Z6838 Body mass index (BMI) 38.0-38.9, adult: Secondary | ICD-10-CM | POA: Diagnosis not present

## 2021-10-05 DIAGNOSIS — F329 Major depressive disorder, single episode, unspecified: Secondary | ICD-10-CM | POA: Diagnosis not present

## 2021-10-06 NOTE — Progress Notes (Unsigned)
Chief Complaint:   OBESITY Phillip Roach is here to discuss his progress with his obesity treatment plan along with follow-up of his obesity related diagnoses. Phillip Roach is on keeping a food journal and adhering to recommended goals of 2200-2900 calories and 140+ grams of protein daily and states he is following his eating plan approximately 90% of the time. Phillip Roach states he is walking for 60 minutes 2-3 times per week.  Today's visit was #: 39 Starting weight: 325 lbs Starting date: 09/23/2020 Today's weight: 302 lbs Today's date: 09/29/2021 Total lbs lost to date: 23 Total lbs lost since last in-office visit: 0  Interim History: Phillip Roach continues to work on his diet and weight loss, but he is not feeling as satisfied.  He is open to increasing his dose of Wegovy.  Subjective:   1. Vitamin D deficiency Phillip Roach's last vitamin D level was at goal, no side effects were noted.  2. Other depression, emotional eating behaviors Phillip Roach is stable on Zoloft and Wellbutrin, and he is mindful of his emotional eating behaviors.  No side effects were noted.  Assessment/Plan:   1. Vitamin D deficiency Phillip Roach will continue OTC vitamin D, and we will recheck labs in 1 to 2 months.  2. Other depression, emotional eating behaviors Phillip Roach will continue his medications, and we will continue to follow.  3. Obesity, Current BMI 38.8 Phillip Roach is currently in the action stage of change. As such, his goal is to continue with weight loss efforts. He has agreed to keeping a food journal and adhering to recommended goals of 2200 calories and 120+ grams of protein daily.   We discussed various medication options to help Phillip Roach with his weight loss efforts and we both agreed to increase Wegovy to 2.4 mg once weekly.  Exercise goals: As is.   Behavioral modification strategies: increasing lean protein intake.  Phillip Roach has agreed to follow-up with our clinic in 4 weeks. He was informed of the importance of frequent follow-up  visits to maximize his success with intensive lifestyle modifications for his multiple health conditions.   Objective:   Blood pressure 123/75, pulse 74, temperature 97.8 F (36.6 C), height '6\' 2"'$  (1.88 m), weight (!) 302 lb (137 kg), SpO2 97 %. Body mass index is 38.77 kg/m.  General: Cooperative, alert, well developed, in no acute distress. HEENT: Conjunctivae and lids unremarkable. Cardiovascular: Regular rhythm.  Lungs: Normal work of breathing. Neurologic: No focal deficits.   Lab Results  Component Value Date   CREATININE 0.92 05/07/2021   BUN 15 05/07/2021   NA 136 05/07/2021   K 4.2 05/07/2021   CL 104 05/07/2021   CO2 24 05/07/2021   Lab Results  Component Value Date   ALT 23 05/15/2020   AST 14 05/15/2020   ALKPHOS 50 05/15/2020   BILITOT 0.5 05/15/2020   Lab Results  Component Value Date   HGBA1C 5.1 05/07/2021   HGBA1C 5.6 01/20/2021   HGBA1C 5.2 10/22/2020   HGBA1C 5.7 (H) 02/25/2020   HGBA1C 5.7 (H) 09/24/2019   Lab Results  Component Value Date   INSULIN 22.4 07/29/2021   INSULIN 6.5 10/22/2020   INSULIN 9.7 02/25/2020   INSULIN 17.3 09/24/2019   Lab Results  Component Value Date   TSH 2.160 06/24/2021   Lab Results  Component Value Date   CHOL 142 07/29/2021   HDL 33 (L) 07/29/2021   LDLCALC 88 07/29/2021   LDLDIRECT 85.4 07/28/2007   TRIG 112 07/29/2021   CHOLHDL 5 06/27/2012  Lab Results  Component Value Date   VD25OH 57.1 06/24/2021   VD25OH 41.3 10/22/2020   VD25OH 37.6 02/25/2020   Lab Results  Component Value Date   WBC 8.2 05/07/2021   HGB 15.3 05/07/2021   HCT 45.0 05/07/2021   MCV 86.8 05/07/2021   PLT 224.0 05/07/2021   No results found for: "IRON", "TIBC", "FERRITIN"  Attestation Statements:   Reviewed by clinician on day of visit: allergies, medications, problem list, medical history, surgical history, family history, social history, and previous encounter notes.  I, Trixie Dredge, am acting as  transcriptionist for Dennard Nip, MD.  I have reviewed the above documentation for accuracy and completeness, and I agree with the above. -  Dennard Nip, MD

## 2021-10-14 ENCOUNTER — Encounter (INDEPENDENT_AMBULATORY_CARE_PROVIDER_SITE_OTHER): Payer: Self-pay

## 2021-10-21 ENCOUNTER — Encounter (INDEPENDENT_AMBULATORY_CARE_PROVIDER_SITE_OTHER): Payer: Self-pay | Admitting: Family Medicine

## 2021-10-21 DIAGNOSIS — E669 Obesity, unspecified: Secondary | ICD-10-CM

## 2021-10-21 NOTE — Telephone Encounter (Signed)
Last saw Dr Leafy Ro

## 2021-10-22 ENCOUNTER — Other Ambulatory Visit (INDEPENDENT_AMBULATORY_CARE_PROVIDER_SITE_OTHER): Payer: Self-pay | Admitting: Family Medicine

## 2021-10-22 ENCOUNTER — Other Ambulatory Visit (HOSPITAL_BASED_OUTPATIENT_CLINIC_OR_DEPARTMENT_OTHER): Payer: Self-pay

## 2021-10-22 DIAGNOSIS — F329 Major depressive disorder, single episode, unspecified: Secondary | ICD-10-CM | POA: Diagnosis not present

## 2021-10-22 MED ORDER — SEMAGLUTIDE-WEIGHT MANAGEMENT 2.4 MG/0.75ML ~~LOC~~ SOAJ
2.4000 mg | SUBCUTANEOUS | 0 refills | Status: DC
  Filled 2021-10-22: qty 3, 28d supply, fill #0

## 2021-10-22 NOTE — Telephone Encounter (Signed)
Please increase wegovy to 2.4

## 2021-10-22 NOTE — Telephone Encounter (Signed)
Dr. Leafy Ro on July 25th. Refill requested at that time.  Wegovy last written on 08/31/21. Please advise pt.

## 2021-11-02 ENCOUNTER — Other Ambulatory Visit (HOSPITAL_BASED_OUTPATIENT_CLINIC_OR_DEPARTMENT_OTHER): Payer: Self-pay

## 2021-11-02 ENCOUNTER — Encounter: Payer: Self-pay | Admitting: Family Medicine

## 2021-11-02 ENCOUNTER — Ambulatory Visit (INDEPENDENT_AMBULATORY_CARE_PROVIDER_SITE_OTHER): Payer: 59 | Admitting: Family Medicine

## 2021-11-02 ENCOUNTER — Encounter (INDEPENDENT_AMBULATORY_CARE_PROVIDER_SITE_OTHER): Payer: Self-pay | Admitting: Family Medicine

## 2021-11-02 VITALS — BP 120/74 | HR 80 | Temp 98.0°F | Ht 74.0 in | Wt 300.0 lb

## 2021-11-02 DIAGNOSIS — F32A Depression, unspecified: Secondary | ICD-10-CM

## 2021-11-02 DIAGNOSIS — Z6838 Body mass index (BMI) 38.0-38.9, adult: Secondary | ICD-10-CM

## 2021-11-02 DIAGNOSIS — E559 Vitamin D deficiency, unspecified: Secondary | ICD-10-CM

## 2021-11-02 DIAGNOSIS — F419 Anxiety disorder, unspecified: Secondary | ICD-10-CM | POA: Diagnosis not present

## 2021-11-02 DIAGNOSIS — E669 Obesity, unspecified: Secondary | ICD-10-CM

## 2021-11-02 MED ORDER — SEMAGLUTIDE-WEIGHT MANAGEMENT 2.4 MG/0.75ML ~~LOC~~ SOAJ
2.4000 mg | SUBCUTANEOUS | 0 refills | Status: DC
  Filled 2021-11-02 – 2021-11-14 (×2): qty 3, 28d supply, fill #0

## 2021-11-07 NOTE — Progress Notes (Signed)
Chief Complaint:   OBESITY Phillip Roach is here to discuss his progress with his obesity treatment plan along with follow-up of his obesity related diagnoses. Phillip Roach is keeping a food journal and adhering to recommended goals of 2200 calories and 120+ grams of protein and states he is following his eating plan approximately 50% of the time. Phillip Roach states he is doing chores and work for 3-4 hours and walking the dog for 45 minutes 7 times per week.  Today's visit was #: 63 Starting weight: 325 lbs Starting date: 09/23/2020 Today's weight: 300 lbs Today's date: 11/02/2021 Total lbs lost to date: 25 lbs Total lbs lost since last in-office visit: 2  Interim History: Phillip Roach mentions that he has been struggling with familial demands and stress. He is alternating between hunger and starving and making the best available choices. Phillip Roach is struggling with motivation. He is trying to shop thrifty due to financial constraints.  Subjective:   1. Vitamin D deficiency He is was taking prescription vitamin D 50,000 IU each week. His last vitamin D level is 57.1. He noted fatigue and denies nausea, vomiting, or muscle weakness.  Lab Results  Component Value Date   VD25OH 57.1 06/24/2021   VD25OH 41.3 10/22/2020   VD25OH 37.6 02/25/2020   2. Anxiety and depression Phillip Roach is on sertraline and Wellbutrin and he is still struggling with motivation.  Assessment/Plan:   1. Vitamin D deficiency Phillip Roach agrees to continue to take OTC vitamin D and to follow up at the agreed upon time.  2. Anxiety and depression Phillip Roach agrees to continue taking Wellbutrin. I told him that he can increase sertraline to '75mg'$  daily. He agreed to follow up as directed.  3. Obesity with current BMI 38.5 Phillip Roach agrees to continue taking Phillip Roach and will follow up as directed.  - Semaglutide-Weight Management 2.4 MG/0.75ML SOAJ; Inject 2.4 mg into the skin once a week.  Dispense: 3 mL; Refill: 0  Phillip Roach is currently in the action  stage of change. As such, his goal is to continue with weight loss efforts. He has agreed to keeping a food journal and adhering to recommended goals of 2200 calories and 150+ grams of protein daily.  Exercise goals: All adults should avoid inactivity. Some physical activity is better than none, and adults who participate in any amount of physical activity gain some health benefits.  Behavioral modification strategies: increasing lean protein intake, meal planning and cooking strategies, keeping healthy foods in the home, and planning for success.  Phillip Roach has agreed to follow-up with our clinic in 3 to 4 weeks. He was informed of the importance of frequent follow-up visits to maximize his success with intensive lifestyle modifications for his multiple health conditions.   Objective:   Blood pressure 120/74, pulse 80, temperature 98 F (36.7 C), height '6\' 2"'$  (1.88 m), weight 300 lb (136.1 kg), SpO2 96 %. Body mass index is 38.52 kg/m.  General: Cooperative, alert, well developed, in no acute distress. HEENT: Conjunctivae and lids unremarkable. Cardiovascular: Regular rhythm.  Lungs: Normal work of breathing. Neurologic: No focal deficits.   Lab Results  Component Value Date   CREATININE 0.92 05/07/2021   BUN 15 05/07/2021   NA 136 05/07/2021   K 4.2 05/07/2021   CL 104 05/07/2021   CO2 24 05/07/2021   Lab Results  Component Value Date   ALT 23 05/15/2020   AST 14 05/15/2020   ALKPHOS 50 05/15/2020   BILITOT 0.5 05/15/2020   Lab Results  Component  Value Date   HGBA1C 5.1 05/07/2021   HGBA1C 5.6 01/20/2021   HGBA1C 5.2 10/22/2020   HGBA1C 5.7 (H) 02/25/2020   HGBA1C 5.7 (H) 09/24/2019   Lab Results  Component Value Date   INSULIN 22.4 07/29/2021   INSULIN 6.5 10/22/2020   INSULIN 9.7 02/25/2020   INSULIN 17.3 09/24/2019   Lab Results  Component Value Date   TSH 2.160 06/24/2021   Lab Results  Component Value Date   CHOL 142 07/29/2021   HDL 33 (L) 07/29/2021    LDLCALC 88 07/29/2021   LDLDIRECT 85.4 07/28/2007   TRIG 112 07/29/2021   CHOLHDL 5 06/27/2012   Lab Results  Component Value Date   VD25OH 57.1 06/24/2021   VD25OH 41.3 10/22/2020   VD25OH 37.6 02/25/2020   Lab Results  Component Value Date   WBC 8.2 05/07/2021   HGB 15.3 05/07/2021   HCT 45.0 05/07/2021   MCV 86.8 05/07/2021   PLT 224.0 05/07/2021   No results found for: "IRON", "TIBC", "FERRITIN"  Attestation Statements:   Reviewed by clinician on day of visit: allergies, medications, problem list, medical history, surgical history, family history, social history, and previous encounter notes.  IMarcille Blanco, CMA, am acting as transcriptionist for Coralie Common, MD  I have reviewed the above documentation for accuracy and completeness, and I agree with the above. - Coralie Common, MD

## 2021-11-11 DIAGNOSIS — F329 Major depressive disorder, single episode, unspecified: Secondary | ICD-10-CM | POA: Diagnosis not present

## 2021-11-16 ENCOUNTER — Emergency Department (HOSPITAL_BASED_OUTPATIENT_CLINIC_OR_DEPARTMENT_OTHER)
Admission: EM | Admit: 2021-11-16 | Discharge: 2021-11-16 | Disposition: A | Discharge status: Home / Self Care | Payer: 59 | Attending: Emergency Medicine | Admitting: Emergency Medicine

## 2021-11-16 ENCOUNTER — Other Ambulatory Visit: Payer: Self-pay

## 2021-11-16 ENCOUNTER — Emergency Department (HOSPITAL_BASED_OUTPATIENT_CLINIC_OR_DEPARTMENT_OTHER): Payer: 59

## 2021-11-16 ENCOUNTER — Other Ambulatory Visit (HOSPITAL_BASED_OUTPATIENT_CLINIC_OR_DEPARTMENT_OTHER): Payer: Self-pay

## 2021-11-16 ENCOUNTER — Encounter (HOSPITAL_BASED_OUTPATIENT_CLINIC_OR_DEPARTMENT_OTHER): Payer: Self-pay

## 2021-11-16 DIAGNOSIS — R509 Fever, unspecified: Secondary | ICD-10-CM | POA: Diagnosis present

## 2021-11-16 DIAGNOSIS — J45909 Unspecified asthma, uncomplicated: Secondary | ICD-10-CM | POA: Insufficient documentation

## 2021-11-16 DIAGNOSIS — I10 Essential (primary) hypertension: Secondary | ICD-10-CM | POA: Diagnosis not present

## 2021-11-16 DIAGNOSIS — R5383 Other fatigue: Secondary | ICD-10-CM | POA: Diagnosis not present

## 2021-11-16 DIAGNOSIS — I471 Supraventricular tachycardia: Secondary | ICD-10-CM | POA: Insufficient documentation

## 2021-11-16 DIAGNOSIS — U071 COVID-19: Secondary | ICD-10-CM | POA: Insufficient documentation

## 2021-11-16 DIAGNOSIS — Z79899 Other long term (current) drug therapy: Secondary | ICD-10-CM | POA: Insufficient documentation

## 2021-11-16 DIAGNOSIS — R059 Cough, unspecified: Secondary | ICD-10-CM | POA: Diagnosis not present

## 2021-11-16 LAB — CBC WITH DIFFERENTIAL/PLATELET
Abs Immature Granulocytes: 0.02 10*3/uL (ref 0.00–0.07)
Basophils Absolute: 0 10*3/uL (ref 0.0–0.1)
Basophils Relative: 0 %
Eosinophils Absolute: 0.1 10*3/uL (ref 0.0–0.5)
Eosinophils Relative: 1 %
HCT: 48.2 % (ref 39.0–52.0)
Hemoglobin: 16.4 g/dL (ref 13.0–17.0)
Immature Granulocytes: 0 %
Lymphocytes Relative: 12 %
Lymphs Abs: 1.2 10*3/uL (ref 0.7–4.0)
MCH: 29.4 pg (ref 26.0–34.0)
MCHC: 34 g/dL (ref 30.0–36.0)
MCV: 86.4 fL (ref 80.0–100.0)
Monocytes Absolute: 1 10*3/uL (ref 0.1–1.0)
Monocytes Relative: 11 %
Neutro Abs: 7.2 10*3/uL (ref 1.7–7.7)
Neutrophils Relative %: 76 %
Platelets: 261 10*3/uL (ref 150–400)
RBC: 5.58 MIL/uL (ref 4.22–5.81)
RDW: 11.9 % (ref 11.5–15.5)
WBC: 9.5 10*3/uL (ref 4.0–10.5)
nRBC: 0 % (ref 0.0–0.2)

## 2021-11-16 LAB — BASIC METABOLIC PANEL
Anion gap: 8 (ref 5–15)
BUN: 11 mg/dL (ref 6–20)
CO2: 27 mmol/L (ref 22–32)
Calcium: 9.2 mg/dL (ref 8.9–10.3)
Chloride: 101 mmol/L (ref 98–111)
Creatinine, Ser: 1.21 mg/dL (ref 0.61–1.24)
GFR, Estimated: >60 mL/min (ref >60)
Glucose, Bld: 89 mg/dL (ref 70–99)
Potassium: 3.8 mmol/L (ref 3.5–5.1)
Sodium: 136 mmol/L (ref 135–145)

## 2021-11-16 LAB — SARS CORONAVIRUS 2 BY RT PCR: SARS Coronavirus 2 by RT PCR: POSITIVE — AB

## 2021-11-16 LAB — MAGNESIUM: Magnesium: 2 mg/dL (ref 1.7–2.4)

## 2021-11-16 MED ORDER — SODIUM CHLORIDE 0.9 % IV BOLUS
1000.0000 mL | Freq: Once | INTRAVENOUS | Status: AC
  Administered 2021-11-16: 1000 mL via INTRAVENOUS

## 2021-11-16 MED ORDER — NIRMATRELVIR/RITONAVIR (PAXLOVID)TABLET: 3.0000 | ORAL_TABLET | Freq: Two times a day (BID) | ORAL | 0 refills | Status: AC

## 2021-11-16 NOTE — ED Notes (Signed)
Dr. Sherry Ruffing at bedside attempting vagal manuevers. Converted from 170 to 115

## 2021-11-16 NOTE — ED Provider Notes (Signed)
[facility] EMERGENCY DEPARTMENT Provider Note   CSN: [id number] Arrival date & time: 11/16/21  1737     History  Chief Complaint  Patient presents with   Tachycardia    Phillip Roach is a 41 y.o. male.  The history is provided by the patient and medical records. No language interpreter was used.  Palpitations Palpitations quality:  Fast Onset quality:  Sudden Duration:  30 minutes Timing:  Constant Progression:  Unchanged Chronicity:  New Relieved by:  Nothing Worsened by:  Nothing Ineffective treatments:  Valsalva Associated symptoms: cough   Associated symptoms: no back pain, no chest pain, no chest pressure, no diaphoresis, no dizziness, no leg pain, no lower extremity edema, no malaise/fatigue, no nausea, no near-syncope, no numbness, no shortness of breath, no vomiting and no weakness   Risk factors: no hx of atrial fibrillation        Home Medications Prior to Admission medications   Medication Sig Start Date End Date Taking? Authorizing Provider  acetaminophen (TYLENOL) 500 MG tablet Take 1 tablet (500 mg total) by mouth every 6 (six) hours as needed. Patient taking differently: Take 1,000 mg by mouth every 6 (six) hours as needed for moderate pain or headache. 07/16/19   Lamptey, Myrene Galas, MD  b complex vitamins tablet Take 2 tablets by mouth daily.    [provider]  buPROPion (WELLBUTRIN XL) 150 MG 24 hr tablet Take 3 tablets (450 mg total) by mouth daily. 08/31/21   Laqueta Linden, MD  diclofenac Sodium (VOLTAREN) 1 % GEL Apply a small grape sized dollop to tender spot on elbow 4 times daily as needed. 11/17/20   [name], MD  ibuprofen (ADVIL,MOTRIN) 200 MG tablet Take 800 mg by mouth every 6 (six) hours as needed for pain.    [provider]  loratadine (CLARITIN) 10 MG tablet Take 10 mg by mouth daily.    [provider]  Semaglutide-Weight Management 2.4 MG/0.75ML SOAJ Inject 2.4 mg into the skin  once a week. 11/02/21   Laqueta Linden, MD  sertraline (ZOLOFT) 50 MG tablet Take 1 tablet (50 mg total) by mouth daily. 08/31/21   [name], MD  testosterone cypionate (DEPOTESTOSTERONE CYPIONATE) 200 MG/ML injection INJECT 1 ML (200 MG TOTAL) INTO THE MUSCLE EVERY 14 (FOURTEEN) DAYS. 08/20/21 02/16/22  [name], MD  triamcinolone (NASACORT) 55 MCG/ACT AERO nasal inhaler Place 1 spray into the nose daily.    [provider]      Allergies    Shellfish allergy and Keflex [cephalexin]    Review of Systems   Review of Systems  Constitutional:  Positive for chills. Negative for diaphoresis, fatigue, fever and malaise/fatigue.  HENT:  Positive for congestion.   Respiratory:  Positive for cough. Negative for chest tightness, shortness of breath, wheezing and stridor.   Cardiovascular:  Positive for palpitations. Negative for chest pain and near-syncope.  Gastrointestinal:  Negative for abdominal pain, diarrhea, nausea and vomiting.  Genitourinary:  Negative for dysuria and flank pain.  Musculoskeletal:  Negative for back pain.  Neurological:  Negative for dizziness, weakness, light-headedness, numbness and headaches.  Psychiatric/Behavioral:  Negative for agitation.   All other systems reviewed and are negative.   Physical Exam Updated Vital Signs There were no vitals taken for this visit. Physical Exam Vitals and nursing note reviewed.  Constitutional:      General: He is not in acute distress.    Appearance: He is well-developed. He is not  ill-appearing, toxic-appearing or diaphoretic.  HENT:     Head: Normocephalic and atraumatic.     Nose: Congestion present.     Mouth/Throat:     Mouth: Mucous membranes are dry.     Pharynx: No oropharyngeal exudate or posterior oropharyngeal erythema.  Eyes:     Extraocular Movements: Extraocular movements intact.     Conjunctiva/sclera: Conjunctivae normal.     Pupils: Pupils are equal, round, and  reactive to light.  Cardiovascular:     Rate and Rhythm: Regular rhythm. Tachycardia present.     Heart sounds: No murmur heard. Pulmonary:     Effort: Pulmonary effort is normal. No respiratory distress.     Breath sounds: Normal breath sounds. No wheezing, rhonchi or rales.  Chest:     Chest wall: No tenderness.  Abdominal:     General: Abdomen is flat.     Palpations: Abdomen is soft.     Tenderness: There is no abdominal tenderness. There is no guarding or rebound.  Musculoskeletal:        General: No swelling or tenderness.     Cervical back: Neck supple. No tenderness.  Skin:    General: Skin is warm and dry.     Capillary Refill: Capillary refill takes less than 2 seconds.  Neurological:     Mental Status: He is alert.  Psychiatric:        Mood and Affect: Mood normal.     ED Results / Procedures / Treatments   Labs (all labs ordered are listed, but only abnormal results are displayed) Labs Reviewed  SARS CORONAVIRUS 2 BY RT PCR - Abnormal; Notable for the following components:      Result Value   SARS Coronavirus 2 by RT PCR POSITIVE (*)    All other components within normal limits  CBC WITH DIFFERENTIAL/PLATELET  BASIC METABOLIC PANEL  MAGNESIUM  TSH    EKG EKG Interpretation  Date/Time:  Monday November 16 2021 17:43:06 EDT Ventricular Rate:  113 PR Interval:  150 QRS Duration: 92 QT Interval:  304 QTC Calculation: 417 R Axis:   85 Text Interpretation: Sinus tachycardia when compared to ECG earlier, now sinus tachycardia from SVT. No STEMI Confirmed by Antony Blackbird [phone]) on 11/16/2021 5:58:12 PM  EKG Interpretation  Date/Time:  Monday November 16 2021 17:40:16 EDT Ventricular Rate:  147 PR Interval:    QRS Duration: 92 QT Interval:  281 QTC Calculation: 440 R Axis:   85 Text Interpretation: Supraventricular tachycardia no prior for comparison No STEMI  Confirmed by Antony Blackbird [phone]) on 11/16/2021 5:57:38 PM  Radiology DG Chest  Portable 1 View  Result Date: 11/16/2021 CLINICAL DATA:  Cough and fatigue EXAM: PORTABLE CHEST 1 VIEW COMPARISON:  April 21, 2016 FINDINGS: The heart size and mediastinal contours are within normal limits. Both lungs are clear. The visualized skeletal structures are unremarkable. IMPRESSION: No acute cardiopulmonary abnormality. Electronically Signed   By: Beryle Flock M.D.   On: 11/16/2021 18:07    Procedures Procedures    Medications Ordered in ED Medications  sodium chloride 0.9 % bolus 1,000 mL (0 mLs Intravenous Stopped 11/16/21 1917)    ED Course/ Medical Decision Making/ A&P                           Medical Decision Making Amount and/or Complexity of Data Reviewed Labs: ordered. Radiology: ordered.    Phillip Roach is a 41 y.o. male with a past medical  history significant for hypertension, GERD, ADHD, asthma, depression, sleep apnea, and previous parotid tumor status post resection who presents with palpitations and SVT.  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  This happened shortly before arrival.  He reported that for the last day or 2 he has had some low-grade near fever of 99 and some cough/congestion and was questioning if he was developing a sinus infection which has had in the past.  He reports he was taking some cough medicine but is been trying to stay hydrated.  He otherwise denies any chest pain with this but is felt the palpitations.  He denies any lightheadedness or syncope and denies significant shortness of breath.  He tried a vagal maneuver before coming but was unsuccessful.  He presents for evaluation given the EKG they did he is concerning for SVT.  He has no history of A-fib or a flutter but does have a family history of some SVT reports.  On arrival, EKG does show evidence of SVT.  Heart rate is in the 140s on  initial evaluation.  He has intact pulses in all extremities and chest was nontender.  Lungs were clear and there was no murmur.  Abdomen nontender.  Patient otherwise resting.  We agreed to try a vagal maneuver to try to convert him before going to other interventions.  Vagal needle was successful and he converted to sinus rhythm very quickly.  We will give some fluids and get screening labs to look for abnormalities.  We will get chest x-ray given his cough and will get a COVID swab given his symptoms as well.  Anticipate reassessment after work-up is completed to determine disposition.  Repeat EKG shows sinus tachycardia now.  He still denies any chest pain or shortness of breath.  Palpitations have also improved.   Patient was found to be COVID-positive.  Other labs appear reassuring overall.  X-ray does not show pneumonia.  Magnesium normal.  CBC and BMP reassuring.  Patient proved ability for over 2 hours and remains in sinus rhythm.  Heart rate is improved after fluids.  We have a long shared system and conversation and he agrees with discharge home.  Spoke with pharmacy who confirmed Paxlovid is safe with his other medications.  Patient given prescription for Paxlovid and will follow-up with PCP.  He understood return precautions and isolation instructions and was discharged in good condition.         Final Clinical Impression(s) / ED Diagnoses Final diagnoses:  SVT (supraventricular tachycardia) ([place])  COVID-19    Rx / DC Orders ED Discharge Orders          Ordered    nirmatrelvir/ritonavir EUA (PAXLOVID) 20 x 150 MG & 10 x '100MG'$  TABS  2 times daily        11/16/21 2009            Clinical Impression: 1. SVT (supraventricular tachycardia) ([place])   2. COVID-19     Disposition: Discharge  Condition: Good  I have discussed the results, Dx and Tx plan with the pt(& family if present). He/she/they expressed understanding and agree(s) with the plan. Discharge  instructions discussed at great length. Strict return precautions discussed and pt &/or family have verbalized understanding of the instructions. No further questions at time of discharge.    New Prescriptions   NIRMATRELVIR/RITONAVIR EUA (PAXLOVID) 20 X  150 MG & 10 X '100MG'$  TABS    Take 3 tablets by mouth 2 (two) times daily for 5 days. Patient GFR is >60. Take nirmatrelvir (150 mg) two tablets twice daily for 5 days and ritonavir (100 mg) one tablet twice daily for 5 days.    Follow Up: [name], [place] [address] [phone]     [facility] [place] [facility] DEPART[facility] [address] [id number] [place] [address]        Mahiya Kercheval, Gwenyth Allegra, MD 11/16/21 2025

## 2021-11-16 NOTE — Discharge Instructions (Signed)
Your history, exam, work-up today revealed you did have SVT that we were able to convert with a vagal maneuver.  Unfortunately, we did discover that you have COVID-19 causing your constellation of symptoms otherwise.  After fluids, your heart rate has improved and you have proven stability for over 2 hours.  We do feel you are safe for discharge home and I spoke with pharmacy who confirmed the medication appears to be safe with your other medications.  Please rest and stay hydrated and stay isolated.  If any symptoms change or worsen acutely, please return to the nearest emergency department.

## 2021-11-16 NOTE — ED Triage Notes (Addendum)
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 HR 175 in SVT. HR 160 at arrival. Denies CP/SOB. No hx of same. Attempted vagal maneuvers without success.  C/o headache, low grade fever and sinus drainage for the past few days.

## 2021-11-17 ENCOUNTER — Other Ambulatory Visit (HOSPITAL_BASED_OUTPATIENT_CLINIC_OR_DEPARTMENT_OTHER): Payer: Self-pay

## 2021-11-17 LAB — TSH: TSH: 0.758 u[IU]/mL (ref 0.350–4.500)

## 2021-11-17 MED ORDER — NIRMATRELVIR&RITONAVIR 300/100 20 X 150 MG & 10 X 100MG PO TBPK
ORAL_TABLET | ORAL | 0 refills | Status: DC
  Filled 2021-11-17: qty 30, 5d supply, fill #0

## 2021-11-24 ENCOUNTER — Ambulatory Visit (INDEPENDENT_AMBULATORY_CARE_PROVIDER_SITE_OTHER): Payer: 59 | Admitting: Family Medicine

## 2021-11-24 ENCOUNTER — Encounter (INDEPENDENT_AMBULATORY_CARE_PROVIDER_SITE_OTHER): Payer: Self-pay | Admitting: Family Medicine

## 2021-11-24 ENCOUNTER — Other Ambulatory Visit (HOSPITAL_BASED_OUTPATIENT_CLINIC_OR_DEPARTMENT_OTHER): Payer: Self-pay

## 2021-11-24 VITALS — BP 110/68 | HR 82 | Temp 98.3°F | Ht 74.0 in | Wt 297.0 lb

## 2021-11-24 DIAGNOSIS — E786 Lipoprotein deficiency: Secondary | ICD-10-CM | POA: Diagnosis not present

## 2021-11-24 DIAGNOSIS — Z6838 Body mass index (BMI) 38.0-38.9, adult: Secondary | ICD-10-CM

## 2021-11-24 DIAGNOSIS — F3289 Other specified depressive episodes: Secondary | ICD-10-CM | POA: Diagnosis not present

## 2021-11-24 DIAGNOSIS — E669 Obesity, unspecified: Secondary | ICD-10-CM | POA: Diagnosis not present

## 2021-11-24 MED ORDER — BUPROPION HCL ER (XL) 150 MG PO TB24
450.0000 mg | ORAL_TABLET | Freq: Every day | ORAL | 0 refills | Status: DC
  Filled 2021-11-24 – 2021-11-27 (×2): qty 270, 90d supply, fill #0

## 2021-11-24 MED ORDER — SEMAGLUTIDE-WEIGHT MANAGEMENT 2.4 MG/0.75ML ~~LOC~~ SOAJ
2.4000 mg | SUBCUTANEOUS | 0 refills | Status: DC
  Filled 2021-11-24 – 2021-12-14 (×2): qty 3, 28d supply, fill #0

## 2021-11-25 DIAGNOSIS — F329 Major depressive disorder, single episode, unspecified: Secondary | ICD-10-CM | POA: Diagnosis not present

## 2021-11-26 NOTE — Progress Notes (Signed)
Chief Complaint:   OBESITY Phillip Roach is here to discuss his progress with his obesity treatment plan along with follow-up of his obesity related diagnoses. Phillip Roach is on keeping a food journal and adhering to recommended goals of 2200 calories and 150+ grams protein daily and states he is following his eating plan approximately 0% of the time. Phillip Roach states he is doing chores for 3 to 4 hours 3 days/week and housework for 15 minutes 7 days/week.  Today's visit was #: 2 Starting weight: 325 lbs Starting date: 09/23/2020 Today's weight: 297 lbs Today's date: 11/24/21 Total lbs lost to date: 28 Total lbs lost since last in-office visit: -3  Interim History: Patient mentions he did not journal at all over the last few weeks.  He feels ready to decrease frequency of journaling.  Since his last appointment he tested positive for COVID and also had to go to the ED for SVT which converted with vagal maneuvers.  He got a new job that he will start the end of October.  He just picked up a new [facility] prescription.  Subjective:   1. Low HDL (under 40) HDL  33, LDL 88, triglycerides 112.  2. Other depression Denies suicidal ideation/homicidal ideation.  On Wellbutrin and Zoloft. Symptoms well managed.  Assessment/Plan:   1. Low HDL (under 40) Repeat labs in 2 to 3 months.  2. Other depression Refill: - buPROPion (WELLBUTRIN XL) 150 MG 24 hr tablet; Take 3 tablets (450 mg total) by mouth daily.  Dispense: 270 tablet; Refill: 0  3. Obesity with current BMI 38.5 Refill: - Semaglutide-Weight Management 2.4 MG/0.75ML SOAJ; Inject 2.4 mg into the skin once a week.  Dispense: 3 mL; Refill: 0  [name] is currently in the action stage of change. As such, his goal is to continue with weight loss efforts. He has agreed to keeping a food journal and adhering to recommended goals of 2200 calories and 150+ grams protein daily .   Exercise goals: All adults should avoid inactivity. Some physical activity is  better than none, and adults who participate in any amount of physical activity gain some health benefits.  Behavioral modification strategies: increasing lean protein intake, no skipping meals, meal planning and cooking strategies, planning for success, and keeping a strict food journal.  [name] has agreed to follow-up with our clinic in 5 weeks. He was informed of the importance of frequent follow-up visits to maximize his success with intensive lifestyle modifications for his multiple health conditions.   Objective:   Blood pressure 110/68, pulse 82, temperature 98.3 F (36.8 C), height '6\' 2"'$  (1.88 m), weight 297 lb (134.7 kg), SpO2 97 %. Body mass index is 38.13 kg/m.  General: Cooperative, alert, well developed, in no acute distress. HEENT: Conjunctivae and lids unremarkable. Cardiovascular: Regular rhythm.  Lungs: Normal work of breathing. Neurologic: No focal deficits.   Lab Results  Component Value Date   CREATININE 1.21 11/16/2021   BUN 11 11/16/2021   NA 136 11/16/2021   K 3.8 11/16/2021   CL 101 11/16/2021   CO2 27 11/16/2021   Lab Results  Component Value Date   ALT 23 05/15/2020   AST 14 05/15/2020   ALKPHOS 50 05/15/2020   BILITOT 0.5 05/15/2020   Lab Results  Component Value Date   HGBA1C 5.1 05/07/2021   HGBA1C 5.6 01/20/2021   HGBA1C 5.2 10/22/2020   HGBA1C 5.7 (H) 02/25/2020   HGBA1C 5.7 (H) 09/24/2019   Lab Results  Component Value Date  INSULIN 22.4 07/29/2021   INSULIN 6.5 10/22/2020   INSULIN 9.7 02/25/2020   INSULIN 17.3 09/24/2019   Lab Results  Component Value Date   TSH 0.758 11/16/2021   Lab Results  Component Value Date   CHOL 142 07/29/2021   HDL 33 (L) 07/29/2021   LDLCALC 88 07/29/2021   LDLDIRECT 85.4 07/28/2007   TRIG 112 07/29/2021   CHOLHDL 5 06/27/2012   Lab Results  Component Value Date   VD25OH 57.1 06/24/2021   VD25OH 41.3 10/22/2020   VD25OH 37.6 02/25/2020   Lab Results  Component Value Date   WBC 9.5  11/16/2021   HGB 16.4 11/16/2021   HCT 48.2 11/16/2021   MCV 86.4 11/16/2021   PLT 261 11/16/2021   No results found for: "IRON", "TIBC", "FERRITIN"   Attestation Statements:   Reviewed by clinician on day of visit: allergies, medications, problem list, medical history, surgical history, family history, social history, and previous encounter notes.  I, Dawn Whitmire, FNP-C, am acting as Location manager for Coralie Common, MD.  I have reviewed the above documentation for accuracy and completeness, and I agree with the above. - Coralie Common, MD

## 2021-11-27 ENCOUNTER — Other Ambulatory Visit (HOSPITAL_BASED_OUTPATIENT_CLINIC_OR_DEPARTMENT_OTHER): Payer: Self-pay

## 2021-11-30 ENCOUNTER — Other Ambulatory Visit (HOSPITAL_BASED_OUTPATIENT_CLINIC_OR_DEPARTMENT_OTHER): Payer: Self-pay

## 2021-12-09 DIAGNOSIS — F329 Major depressive disorder, single episode, unspecified: Secondary | ICD-10-CM | POA: Diagnosis not present

## 2021-12-14 ENCOUNTER — Other Ambulatory Visit (HOSPITAL_BASED_OUTPATIENT_CLINIC_OR_DEPARTMENT_OTHER): Payer: Self-pay

## 2021-12-21 ENCOUNTER — Encounter (INDEPENDENT_AMBULATORY_CARE_PROVIDER_SITE_OTHER): Payer: Self-pay | Admitting: Family Medicine

## 2021-12-21 ENCOUNTER — Other Ambulatory Visit (HOSPITAL_BASED_OUTPATIENT_CLINIC_OR_DEPARTMENT_OTHER): Payer: Self-pay

## 2021-12-21 ENCOUNTER — Other Ambulatory Visit (INDEPENDENT_AMBULATORY_CARE_PROVIDER_SITE_OTHER): Payer: Self-pay | Admitting: Family Medicine

## 2021-12-21 DIAGNOSIS — E669 Obesity, unspecified: Secondary | ICD-10-CM

## 2021-12-21 MED ORDER — WEGOVY 2.4 MG/0.75ML ~~LOC~~ SOAJ
2.4000 mg | SUBCUTANEOUS | 0 refills | Status: DC
  Filled 2021-12-21 – 2022-01-09 (×2): qty 3, 28d supply, fill #0

## 2021-12-21 NOTE — Telephone Encounter (Signed)
Wait for pt to check with pharmacy

## 2021-12-22 ENCOUNTER — Ambulatory Visit: Payer: 59 | Admitting: Family Medicine

## 2021-12-22 ENCOUNTER — Other Ambulatory Visit (HOSPITAL_BASED_OUTPATIENT_CLINIC_OR_DEPARTMENT_OTHER): Payer: Self-pay

## 2021-12-22 ENCOUNTER — Encounter: Payer: Self-pay | Admitting: Family Medicine

## 2021-12-22 VITALS — BP 124/80 | HR 77 | Temp 97.7°F | Ht 74.0 in | Wt 300.0 lb

## 2021-12-22 DIAGNOSIS — T887XXA Unspecified adverse effect of drug or medicament, initial encounter: Secondary | ICD-10-CM

## 2021-12-22 DIAGNOSIS — E291 Testicular hypofunction: Secondary | ICD-10-CM

## 2021-12-22 DIAGNOSIS — F32A Depression, unspecified: Secondary | ICD-10-CM

## 2021-12-22 DIAGNOSIS — F419 Anxiety disorder, unspecified: Secondary | ICD-10-CM

## 2021-12-22 DIAGNOSIS — F3289 Other specified depressive episodes: Secondary | ICD-10-CM

## 2021-12-22 DIAGNOSIS — G4733 Obstructive sleep apnea (adult) (pediatric): Secondary | ICD-10-CM

## 2021-12-22 DIAGNOSIS — Z9189 Other specified personal risk factors, not elsewhere classified: Secondary | ICD-10-CM | POA: Diagnosis not present

## 2021-12-22 MED ORDER — VENLAFAXINE HCL ER 75 MG PO CP24
75.0000 mg | ORAL_CAPSULE | Freq: Every day | ORAL | 2 refills | Status: DC
  Filled 2021-12-22 – 2021-12-28 (×2): qty 30, 30d supply, fill #0
  Filled 2022-01-29: qty 30, 30d supply, fill #1

## 2021-12-22 MED ORDER — SERTRALINE HCL 50 MG PO TABS
ORAL_TABLET | ORAL | 0 refills | Status: DC
  Filled 2021-12-22: qty 10, fill #0
  Filled 2022-01-09 – 2022-01-29 (×2): qty 10, 10d supply, fill #0

## 2021-12-22 NOTE — Progress Notes (Signed)
Established Patient Office Visit  Subjective   Patient ID: Phillip Roach, male    DOB: [birth year]  Age: 41 y.o. MRN: [id number]  Chief Complaint  Patient presents with   Follow-up    Follow up on medication not working well.     HPI follow-up of depression with anxiety.  Continues with sertraline 50 mg daily.  He is also taking 450 mg of Wellbutrin XL(per weight loss managent).  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  Denies sleep issues with higher dose Wellbutrin but does not always feel rested in the morning.  History of sleep apnea.  He was advised to lose weight.  He does use nasal strips and steroid nose spray at night.  Wife has not complained of the snoring recently.  Continues with talking therapy.  Androgen replacement therapy seems to be helping with libido.  Has not necessarily noticed an increase in energy.    Review of Systems  Constitutional: Negative.   HENT: Negative.    Eyes:  Negative for blurred vision, discharge and redness.  Respiratory: Negative.    Cardiovascular: Negative.   Gastrointestinal:  Negative for abdominal pain.  Genitourinary: Negative.   Musculoskeletal: Negative.  Negative for myalgias.  Skin:  Negative for rash.  Neurological:  Negative for tingling, loss of consciousness and weakness.  Endo/Heme/Allergies:  Negative for polydipsia.      Objective:     BP 124/80 (BP Location: Left Arm, Patient Position: Sitting, Cuff Size: Large)   Pulse 77   Temp 97.7 F (36.5 C) (Temporal)   Ht '6\' 2"'$  (1.88 m)   Wt 300 lb (136.1 kg)   SpO2 97%   BMI 38.52 kg/m    Physical Exam Constitutional:      General: He is not in acute distress.    Appearance: Normal appearance. He is not ill-appearing, toxic-appearing or diaphoretic.  HENT:     Head: Normocephalic and atraumatic.     Right Ear: External ear normal.     Left Ear: External ear normal.     Mouth/Throat:     Mouth: Mucous membranes are moist.     Pharynx:  Oropharynx is clear. No oropharyngeal exudate or posterior oropharyngeal erythema.  Eyes:     General: No scleral icterus.       Right eye: No discharge.        Left eye: No discharge.     Extraocular Movements: Extraocular movements intact.     Conjunctiva/sclera: Conjunctivae normal.     Pupils: Pupils are equal, round, and reactive to light.  Cardiovascular:     Rate and Rhythm: Normal rate and regular rhythm.  Pulmonary:     Effort: Pulmonary effort is normal. No respiratory distress.     Breath sounds: Normal breath sounds.  Musculoskeletal:     Cervical back: No rigidity or tenderness.  Skin:    General: Skin is warm and dry.  Neurological:     Mental Status: He is alert and oriented to person, place, and time.  Psychiatric:        Mood and Affect: Mood normal.        Behavior: Behavior normal.      No results found for any visits on 12/22/21.    The 10-year ASCVD risk score (Arnett DK, et al., 2019) is: 1%    Assessment & Plan:   Problem List Items Addressed This Visit       Endocrine   Androgen deficiency  Other   Anxiety and depression - Primary   Relevant Medications   venlafaxine XR (EFFEXOR XR) 75 MG 24 hr capsule   sertraline (ZOLOFT) 50 MG tablet   Medication side effect   At risk for side effect of medication   Other Visit Diagnoses     Other depression       Relevant Medications   venlafaxine XR (EFFEXOR XR) 75 MG 24 hr capsule   sertraline (ZOLOFT) 50 MG tablet       Return in about 5 weeks (around 01/26/2022).  We will taper Zoloft over 2 weeks.  Start Effexor 75 mg XR daily.  Continue talking therapy.  Recent CBC showed a hemoglobin at 16.4.  [name], MD

## 2021-12-23 DIAGNOSIS — F329 Major depressive disorder, single episode, unspecified: Secondary | ICD-10-CM | POA: Diagnosis not present

## 2021-12-25 ENCOUNTER — Other Ambulatory Visit (HOSPITAL_BASED_OUTPATIENT_CLINIC_OR_DEPARTMENT_OTHER): Payer: Self-pay

## 2021-12-28 ENCOUNTER — Other Ambulatory Visit (HOSPITAL_COMMUNITY): Payer: Self-pay

## 2022-01-04 ENCOUNTER — Ambulatory Visit (INDEPENDENT_AMBULATORY_CARE_PROVIDER_SITE_OTHER): Payer: 59 | Admitting: Family Medicine

## 2022-01-11 ENCOUNTER — Other Ambulatory Visit (HOSPITAL_COMMUNITY): Payer: Self-pay

## 2022-01-12 ENCOUNTER — Encounter (INDEPENDENT_AMBULATORY_CARE_PROVIDER_SITE_OTHER): Payer: Self-pay

## 2022-01-13 ENCOUNTER — Telehealth (INDEPENDENT_AMBULATORY_CARE_PROVIDER_SITE_OTHER): Payer: 59 | Admitting: Family Medicine

## 2022-01-13 ENCOUNTER — Encounter (INDEPENDENT_AMBULATORY_CARE_PROVIDER_SITE_OTHER): Payer: Self-pay | Admitting: Family Medicine

## 2022-01-13 DIAGNOSIS — E669 Obesity, unspecified: Secondary | ICD-10-CM | POA: Diagnosis not present

## 2022-01-13 DIAGNOSIS — Z6838 Body mass index (BMI) 38.0-38.9, adult: Secondary | ICD-10-CM | POA: Diagnosis not present

## 2022-01-13 DIAGNOSIS — R7303 Prediabetes: Secondary | ICD-10-CM | POA: Diagnosis not present

## 2022-01-18 ENCOUNTER — Ambulatory Visit (INDEPENDENT_AMBULATORY_CARE_PROVIDER_SITE_OTHER): Payer: 59

## 2022-01-18 ENCOUNTER — Ambulatory Visit
Admission: EM | Admit: 2022-01-18 | Discharge: 2022-01-18 | Disposition: A | Discharge status: Home / Self Care | Payer: 59 | Attending: Emergency Medicine | Admitting: Emergency Medicine

## 2022-01-18 DIAGNOSIS — R509 Fever, unspecified: Secondary | ICD-10-CM | POA: Diagnosis not present

## 2022-01-18 DIAGNOSIS — R0602 Shortness of breath: Secondary | ICD-10-CM | POA: Diagnosis not present

## 2022-01-18 DIAGNOSIS — R059 Cough, unspecified: Secondary | ICD-10-CM

## 2022-01-18 DIAGNOSIS — J189 Pneumonia, unspecified organism: Secondary | ICD-10-CM

## 2022-01-18 MED ORDER — PROMETHAZINE-DM 6.25-15 MG/5ML PO SYRP: 5.0000 mL | ORAL_SOLUTION | Freq: Four times a day (QID) | ORAL | 0 refills | Status: DC | PRN

## 2022-01-18 MED ORDER — LEVOFLOXACIN 750 MG PO TABS: 750.0000 mg | ORAL_TABLET | Freq: Every day | ORAL | 0 refills | Status: AC

## 2022-01-18 MED ORDER — GUAIFENESIN 400 MG PO TABS: ORAL_TABLET | ORAL | 0 refills | Status: DC

## 2022-01-18 MED ORDER — LEVOFLOXACIN 750 MG PO TABS: 750.0000 mg | ORAL_TABLET | Freq: Every day | ORAL | 0 refills | Status: DC

## 2022-01-18 NOTE — ED Triage Notes (Signed)
Pt c/o fever (101F), SOB, and cough that began Saturday night.  Home intervention:  dayquil, nyquil

## 2022-01-18 NOTE — ED Provider Notes (Signed)
[facility]    CSN: [id number] Arrival date & time: 01/18/22  4098    HISTORY   Chief Complaint  Patient presents with   Shortness of Breath   Cough   Fever   HPI Phillip Roach is a pleasant, 41 y.o. male who presents to urgent care today. Patient complains of fever, Tmax 101 taken orally, shortness of breath with activity and coughing and nonproductive cough that began 3 days ago.  Patient states has been taking NyQuil and DayQuil without meaningful relief of his symptoms.  Patient denies nausea, vomiting, diarrhea, headache, known sick contacts.  Patient denies history of allergies and asthma.  The history is provided by the patient.   Past Medical History:  Diagnosis Date   ADHD    ADHD (attention deficit hyperactivity disorder)    Asthma    as a child, no inhaler - exercise and seasonal allergies induced   Back pain    Depression    Fatigue    GERD (gastroesophageal reflux disease)    in past , no current problems   Heartburn    Hypertension    in past = no current problems, no meds   Joint pain    Lactose intolerance    Overweight    Pre-diabetes    Sleep apnea    does not use cpap   Vitamin D deficiency    Patient Active Problem List   Diagnosis Date Noted   Medication side effect 12/22/2021   At risk for side effect of medication 12/22/2021   Obstructive sleep apnea syndrome 12/22/2021   Depression 09/29/2021   Parotid tumor 06/24/2020   Androgen deficiency 06/06/2020   Low HDL (under 40) 06/06/2020   Prediabetes 05/20/2020   Other fatigue 05/15/2020   Hypertension    Mass of neck 08/14/2019   Recurrent major depressive disorder, in partial remission (HCC) 08/14/2019   Vitamin D deficiency 08/14/2019   Class 3 severe obesity with serious comorbidity and body mass index (BMI) of 40.0 to 44.9 in adult (HCC) 08/14/2019   Vasectomy evaluation 07/01/2014   Obesity (BMI 30-39.9) 09/19/2012   GERD 01/11/2008   Attention deficit  hyperactivity disorder (ADHD), predominantly inattentive type 08/29/2007   Past Surgical History:  Procedure Laterality Date   FOREHEAD RECONSTRUCTION Right    Right Temple Shrapnel Removed   PAROTIDECTOMY Left 06/24/2020   Procedure: SUPERFICIAL PAROTIDECTOMY WITH FACIAL DISSECTION;  Surgeon: Drema Halon, MD;  Location: [facility] OR;  Service: ENT;  Laterality: Left;   PILONIDAL CYST DRAINAGE     WISDOM TOOTH EXTRACTION      Home Medications    Prior to Admission medications   Medication Sig Start Date End Date Taking? Authorizing Provider  acetaminophen (TYLENOL) 500 MG tablet Take 1 tablet (500 mg total) by mouth every 6 (six) hours as needed. Patient taking differently: Take 1,000 mg by mouth every 6 (six) hours as needed for moderate pain or headache. 07/16/19   Lamptey, Britta Mccreedy, MD  b complex vitamins tablet Take 2 tablets by mouth daily.    [provider]  buPROPion (WELLBUTRIN XL) 150 MG 24 hr tablet Take 3 tablets (450 mg total) by mouth daily. 11/24/21   Langston Reusing, MD  ibuprofen (ADVIL,MOTRIN) 200 MG tablet Take 800 mg by mouth every 6 (six) hours as needed for pain.    [provider]  loratadine (CLARITIN) 10 MG tablet Take 10 mg by mouth daily.    [provider]  Semaglutide-Weight Management [facility])  2.4 MG/0.75ML SOAJ Inject 2.4 mg into the skin once a week. 12/21/21   Langston Reusing, MD  sertraline (ZOLOFT) 50 MG tablet Take 0.5 tablets (25 mg total) by mouth daily for 7 days, THEN 0.5 tablets (25 mg total) every other day for 3 days. 12/22/21 12/31/21  Mliss Sax, MD  testosterone cypionate (DEPOTESTOSTERONE CYPIONATE) 200 MG/ML injection INJECT 1 ML (200 MG TOTAL) INTO THE MUSCLE EVERY 14 (FOURTEEN) DAYS. 08/20/21 02/22/22  Mliss Sax, MD  triamcinolone (NASACORT) 55 MCG/ACT AERO nasal inhaler Place 1 spray into the nose daily.    [provider]  venlafaxine XR (EFFEXOR XR) 75 MG 24 hr capsule  Take 1 capsule (75 mg total) by mouth daily with breakfast. 12/22/21   Mliss Sax, MD    Family History Family History  Problem Relation Age of Onset   GER disease Mother    Depression Mother    Alcohol abuse Mother    Obesity Mother    Anxiety disorder Father    Hypertension Father    Sleep apnea Father    Obesity Father    Social History Social History   Tobacco Use   Smoking status: Never   Smokeless tobacco: Never  Vaping Use   Vaping Use: Never used  Substance Use Topics   Alcohol use: Yes    Comment: couple times a month   Drug use: No   Allergies   Shellfish allergy and Keflex [cephalexin]  Review of Systems Review of Systems Pertinent findings revealed after performing a 14 point review of systems has been noted in the history of present illness.  Physical Exam Triage Vital Signs ED Triage Vitals  Enc Vitals Group     BP 01/02/21 0827 (!) 147/82     Pulse Rate 01/02/21 0827 72     Resp 01/02/21 0827 18     Temp 01/02/21 0827 98.3 F (36.8 C)     Temp Source 01/02/21 0827 Oral     SpO2 01/02/21 0827 98 %     Weight --      Height --      Head Circumference --      Peak Flow --      Pain Score 01/02/21 0826 5     Pain Loc --      Pain Edu? --      Excl. in GC? --   No data found.  Updated Vital Signs BP (!) 149/99 (BP Location: Right Arm)   Pulse 100   Temp 99.7 F (37.6 C) (Oral)   Resp 16   SpO2 93%   Physical Exam Vitals and nursing note reviewed.  Constitutional:      General: He is not in acute distress.    Appearance: Normal appearance. He is not ill-appearing.  HENT:     Head: Normocephalic and atraumatic.     Salivary Glands: Right salivary gland is not diffusely enlarged or tender. Left salivary gland is not diffusely enlarged or tender.     Right Ear: Tympanic membrane, ear canal and external ear normal. No drainage. No middle ear effusion. There is no impacted cerumen. Tympanic membrane is not erythematous or  bulging.     Left Ear: Tympanic membrane, ear canal and external ear normal. No drainage.  No middle ear effusion. There is no impacted cerumen. Tympanic membrane is not erythematous or bulging.     Nose: Nose normal. No nasal deformity, septal deviation, mucosal edema, congestion or rhinorrhea.  Right Turbinates: Not enlarged, swollen or pale.     Left Turbinates: Not enlarged, swollen or pale.     Right Sinus: No maxillary sinus tenderness or frontal sinus tenderness.     Left Sinus: No maxillary sinus tenderness or frontal sinus tenderness.     Mouth/Throat:     Lips: Pink. No lesions.     Mouth: Mucous membranes are moist. No oral lesions.     Pharynx: Oropharynx is clear. Uvula midline. No posterior oropharyngeal erythema or uvula swelling.     Tonsils: No tonsillar exudate. 0 on the right. 0 on the left.  Eyes:     General: Lids are normal.        Right eye: No discharge.        Left eye: No discharge.     Extraocular Movements: Extraocular movements intact.     Conjunctiva/sclera: Conjunctivae normal.     Right eye: Right conjunctiva is not injected.     Left eye: Left conjunctiva is not injected.  Neck:     Trachea: Trachea and phonation normal.  Cardiovascular:     Rate and Rhythm: Normal rate and regular rhythm.     Pulses: Normal pulses.     Heart sounds: Normal heart sounds. No murmur heard.    No friction rub. No gallop.  Pulmonary:     Effort: Pulmonary effort is normal. No tachypnea, bradypnea, accessory muscle usage, prolonged expiration or respiratory distress.     Breath sounds: No stridor, decreased air movement or transmitted upper airway sounds. Examination of the right-middle field reveals rales. Examination of the right-lower field reveals rales. Rales present. No decreased breath sounds, wheezing or rhonchi.  Chest:     Chest wall: No tenderness.  Musculoskeletal:        General: Normal range of motion.     Cervical back: Normal range of motion and neck  supple. Normal range of motion.  Lymphadenopathy:     Cervical: No cervical adenopathy.  Skin:    General: Skin is warm and dry.     Findings: No erythema or rash.  Neurological:     General: No focal deficit present.     Mental Status: He is alert and oriented to person, place, and time.  Psychiatric:        Mood and Affect: Mood normal.        Behavior: Behavior normal.     Visual Acuity Right Eye Distance:   Left Eye Distance:   Bilateral Distance:    Right Eye Near:   Left Eye Near:    Bilateral Near:     [facility] Couse / Diagnostics / Procedures:     Radiology  CLINICAL DATA:  Productive cough and fever for 1 week, short of breath   EXAM: CHEST - 2 VIEW   COMPARISON:  11/16/2021   FINDINGS: Frontal and lateral views of the chest demonstrate an unremarkable cardiac silhouette. No airspace disease, effusion, or pneumothorax. No acute bony abnormalities.   IMPRESSION: 1. No acute intrathoracic process.     Electronically Signed   By: Sharlet Salina M.D.   On: 01/18/2022 21:02  Procedures Procedures (including critical care time) EKG  Pending results:  Labs Reviewed - No data to display  Medications Ordered in UC: Medications - No data to display  UC Diagnoses / Final Clinical Impressions(s)   I have reviewed the triage vital signs and the nursing notes.  Pertinent labs & imaging results that were available during my care of the  patient were reviewed by me and considered in my medical decision making (see chart for details).    Final diagnoses:  Community acquired pneumonia of right lower lobe of lung   Patient advised of physical exam findings as well as negative x-ray results.  Patient advised that chest x-ray imaging often last 1 day behind symptoms.  Patient advised for this reason I recommend that he begin taking levofloxacin which was chosen in deference to his reported allergy to Keflex.  Patient further advised that I recommend guaifenesin for  daytime cough to promote sputum production and Promethazine DM for nighttime cough to help get some sleep.  Patient advised to seek repeat evaluation after the next 2 to 3 days if no improvement of symptoms.  ED Prescriptions     Medication Sig Dispense Auth. Provider   guaifenesin (HUMIBID E) 400 MG TABS tablet Take 1 tablet 3 times daily as needed for chest congestion and cough 21 tablet Theadora Rama Scales, PA-C   promethazine-dextromethorphan (PROMETHAZINE-DM) 6.25-15 MG/5ML syrup Take 5 mLs by mouth 4 (four) times daily as needed for cough. 118 mL Theadora Rama Scales, PA-C   levofloxacin (LEVAQUIN) 750 MG tablet Take 1 tablet (750 mg total) by mouth daily for 5 days. 5 tablet Theadora Rama Scales, PA-C      PDMP not reviewed this encounter.  Disposition Upon Discharge:  Condition: stable for discharge home Home: take medications as prescribed; routine discharge instructions as discussed; follow up as advised.  Patient presented with an acute illness with associated systemic symptoms and significant discomfort requiring urgent management. In my opinion, this is a condition that a prudent lay person (someone who possesses an average knowledge of health and medicine) may potentially expect to result in complications if not addressed urgently such as respiratory distress, impairment of bodily function or dysfunction of bodily organs.   Routine symptom specific, illness specific and/or disease specific instructions were discussed with the patient and/or caregiver at length.   As such, the patient has been evaluated and assessed, work-up was performed and treatment was provided in alignment with urgent care protocols and evidence based medicine.  Patient/parent/caregiver has been advised that the patient may require follow up for further testing and treatment if the symptoms continue in spite of treatment, as clinically indicated and appropriate.  If the patient was tested for COVID-19,  Influenza and/or RSV, then the patient/parent/guardian was advised to isolate at home pending the results of his/her diagnostic coronavirus test and potentially longer if they're positive. I have also advised pt that if his/her COVID-19 test returns positive, it's recommended to self-isolate for at least 10 days after symptoms first appeared AND until fever-free for 24 hours without fever reducer AND other symptoms have improved or resolved. Discussed self-isolation recommendations as well as instructions for household member/close contacts as per the [facility] and  [facility], and also gave patient the COVID packet with this information.  Patient/parent/caregiver has been advised to return to the [facility] or PCP in 3-5 days if no better; to PCP or the Emergency Department if new signs and symptoms develop, or if the current signs or symptoms continue to change or worsen for further workup, evaluation and treatment as clinically indicated and appropriate  The patient will follow up with their current PCP if and as advised. If the patient does not currently have a PCP we will assist them in obtaining one.   The patient may need specialty follow up if the symptoms continue, in spite of conservative treatment and  management, for further workup, evaluation, consultation and treatment as clinically indicated and appropriate.  Patient/parent/caregiver verbalized understanding and agreement of plan as discussed.  All questions were addressed during visit.  Please see discharge instructions below for further details of plan.  Discharge Instructions:   Discharge Instructions      Your symptoms and physical exam findings are concerning for a viral respiratory infection that has evolved into pneumonia in your right lower lobe.   Please read below to learn more about the medications, dosages and frequencies that I recommend to help alleviate your symptoms and to get you feeling better soon:   Levofloxacin: Please take 1  dose daily for 5.  This antibiotic can cause upset stomach, this will resolve once antibiotics are complete.  You are welcome to use a probiotic, eat yogurt, take Imodium while taking this medication.  Please avoid other systemic medications such as Maalox, Pepto-Bismol or milk of magnesia as they can interfere with your body's ability to absorb the antibiotics.   Robitussin, Mucinex (guaifenesin): This is an expectorant.  This helps break up chest congestion and loosen up thick nasal drainage making phlegm and drainage more liquid and therefore easier to remove.  I recommend being 400 mg three times daily as needed.      Promethazine DM: Promethazine is both a nasal decongestant and an antinausea medication that makes most patients feel fairly sleepy.  The DM is dextromethorphan, a cough suppressant found in many over-the-counter cough medications.  Please take 5 mL before bedtime to minimize your cough which will help you sleep better.  I have sent a prescription for this medication to your pharmacy.   Please follow-up within the next 5-7 days either with your primary care provider or urgent care if your symptoms do not resolve.          Thank you for visiting urgent care today.  We appreciate the opportunity to participate in your care.         This office note has been dictated using Teaching laboratory technician.  Unfortunately, this method of dictation can sometimes lead to typographical or grammatical errors.  I apologize for your inconvenience in advance if this occurs.  Please do not hesitate to reach out to me if clarification is needed.      Theadora Rama Scales, PA-C 01/21/22 1223

## 2022-01-18 NOTE — Discharge Instructions (Addendum)
Your symptoms and physical exam findings are concerning for a viral respiratory infection that has evolved into pneumonia in your right lower lobe.   Please read below to learn more about the medications, dosages and frequencies that I recommend to help alleviate your symptoms and to get you feeling better soon:   Levofloxacin: Please take 1 dose daily for 5.  This antibiotic can cause upset stomach, this will resolve once antibiotics are complete.  You are welcome to use a probiotic, eat yogurt, take Imodium while taking this medication.  Please avoid other systemic medications such as Maalox, Pepto-Bismol or milk of magnesia as they can interfere with your body's ability to absorb the antibiotics.   Robitussin, Mucinex (guaifenesin): This is an expectorant.  This helps break up chest congestion and loosen up thick nasal drainage making phlegm and drainage more liquid and therefore easier to remove.  I recommend being 400 mg three times daily as needed.      Promethazine DM: Promethazine is both a nasal decongestant and an antinausea medication that makes most patients feel fairly sleepy.  The DM is dextromethorphan, a cough suppressant found in many over-the-counter cough medications.  Please take 5 mL before bedtime to minimize your cough which will help you sleep better.  I have sent a prescription for this medication to your pharmacy.   Please follow-up within the next 5-7 days either with your primary care provider or urgent care if your symptoms do not resolve.          Thank you for visiting urgent care today.  We appreciate the opportunity to participate in your care.

## 2022-01-20 NOTE — Progress Notes (Signed)
TeleHealth Visit:  Due to the COVID-19 pandemic, this visit was completed with telemedicine (audio/video) technology to reduce patient and provider exposure as well as to preserve personal protective equipment.   Phillip Roach has verbally consented to this TeleHealth visit. The patient is located at home, the provider is located at the [facility] office. The participants in this visit include the listed provider and patient. The visit was conducted today via MyChart Video.  Chief Complaint: OBESITY Phillip Roach is here to discuss his progress with his obesity treatment plan along with follow-up of his obesity related diagnoses. Phillip Roach is on keeping a food journal and adhering to recommended goals of 2200 calories and 150+ grams of protein and states he is following his eating plan approximately 50% of the time. Phillip Roach states he is walks the dogs 15 minutes 4-5 times per week.  Today's visit was #: 79 Starting weight: 325 lbs Starting date: 09/23/2020  Interim History: [name] has a cough and URI currently. Youngest child is sick currently. Found his Wegovy pens (went 2-3 weeks without it). He had labile days in terms of intake off Wegovy. He enjoying new job at cath lab. Not sure what he and his wife will do for Thanksgiving. He has not been journaling.  Subjective:   1. Prediabetes Phillip Roach's last A1c was 5.1. He is on [employer]. Denies GI side effects.  Assessment/Plan:   1. Prediabetes Continue GLP-1 without any changes in dose.  2. Obesity with current BMI of 38.3 Phillip Roach is currently in the action stage of change. As such, his goal is to continue with weight loss efforts. He has agreed to keeping a food journal and adhering to recommended goals of 2200 calories and 150+ grams of protein daily.   Exercise goals: All adults should avoid inactivity. Some physical activity is better than none, and adults who participate in any amount of physical activity gain some health  benefits.  Behavioral modification strategies: increasing lean protein intake, meal planning and cooking strategies, keeping healthy foods in the home, planning for success, and keeping a strict food journal.  [name] has agreed to follow-up with our clinic in 4 weeks. He was informed of the importance of frequent follow-up visits to maximize his success with intensive lifestyle modifications for his multiple health conditions.  Objective:   VITALS: Per patient if applicable, see vitals. GENERAL: Alert and in no acute distress. CARDIOPULMONARY: No increased WOB. Speaking in clear sentences.  PSYCH: Pleasant and cooperative. Speech normal rate and rhythm. Affect is appropriate. Insight and judgement are appropriate. Attention is focused, linear, and appropriate.  NEURO: Oriented as arrived to appointment on time with no prompting.   Lab Results  Component Value Date   CREATININE 1.21 11/16/2021   BUN 11 11/16/2021   NA 136 11/16/2021   K 3.8 11/16/2021   CL 101 11/16/2021   CO2 27 11/16/2021   Lab Results  Component Value Date   ALT 23 05/15/2020   AST 14 05/15/2020   ALKPHOS 50 05/15/2020   BILITOT 0.5 05/15/2020   Lab Results  Component Value Date   HGBA1C 5.1 05/07/2021   HGBA1C 5.6 01/20/2021   HGBA1C 5.2 10/22/2020   HGBA1C 5.7 (H) 02/25/2020   HGBA1C 5.7 (H) 09/24/2019   Lab Results  Component Value Date   INSULIN 22.4 07/29/2021   INSULIN 6.5 10/22/2020   INSULIN 9.7 02/25/2020   INSULIN 17.3 09/24/2019   Lab Results  Component Value Date   TSH 0.758 11/16/2021  Lab Results  Component Value Date   CHOL 142 07/29/2021   HDL 33 (L) 07/29/2021   LDLCALC 88 07/29/2021   LDLDIRECT 85.4 07/28/2007   TRIG 112 07/29/2021   CHOLHDL 5 06/27/2012   Lab Results  Component Value Date   VD25OH 57.1 06/24/2021   VD25OH 41.3 10/22/2020   VD25OH 37.6 02/25/2020   Lab Results  Component Value Date   WBC 9.5 11/16/2021   HGB 16.4 11/16/2021   HCT 48.2 11/16/2021    MCV 86.4 11/16/2021   PLT 261 11/16/2021   No results found for: "IRON", "TIBC", "FERRITIN"  Attestation Statements:   Reviewed by clinician on day of visit: allergies, medications, problem list, medical history, surgical history, family history, social history, and previous encounter notes.  I, Elnora Morrison, RMA am acting as transcriptionist for Coralie Common, MD.  I have reviewed the above documentation for accuracy and completeness, and I agree with the above. - Coralie Common, MD

## 2022-01-29 ENCOUNTER — Other Ambulatory Visit: Payer: Self-pay | Admitting: Family Medicine

## 2022-01-29 ENCOUNTER — Other Ambulatory Visit (HOSPITAL_BASED_OUTPATIENT_CLINIC_OR_DEPARTMENT_OTHER): Payer: Self-pay

## 2022-01-29 ENCOUNTER — Other Ambulatory Visit (HOSPITAL_COMMUNITY): Payer: Self-pay

## 2022-01-29 DIAGNOSIS — F3289 Other specified depressive episodes: Secondary | ICD-10-CM

## 2022-02-01 ENCOUNTER — Other Ambulatory Visit (HOSPITAL_COMMUNITY): Payer: Self-pay

## 2022-02-09 ENCOUNTER — Ambulatory Visit (INDEPENDENT_AMBULATORY_CARE_PROVIDER_SITE_OTHER): Payer: 59 | Admitting: Family Medicine

## 2022-02-09 ENCOUNTER — Encounter: Payer: Self-pay | Admitting: Family Medicine

## 2022-02-09 ENCOUNTER — Other Ambulatory Visit (HOSPITAL_COMMUNITY): Payer: Self-pay

## 2022-02-09 ENCOUNTER — Encounter (INDEPENDENT_AMBULATORY_CARE_PROVIDER_SITE_OTHER): Payer: Self-pay | Admitting: Family Medicine

## 2022-02-09 VITALS — BP 137/75 | HR 81 | Temp 97.6°F | Ht 74.0 in | Wt 290.0 lb

## 2022-02-09 DIAGNOSIS — R7303 Prediabetes: Secondary | ICD-10-CM | POA: Diagnosis not present

## 2022-02-09 DIAGNOSIS — E559 Vitamin D deficiency, unspecified: Secondary | ICD-10-CM

## 2022-02-09 DIAGNOSIS — E786 Lipoprotein deficiency: Secondary | ICD-10-CM | POA: Diagnosis not present

## 2022-02-09 DIAGNOSIS — E669 Obesity, unspecified: Secondary | ICD-10-CM | POA: Diagnosis not present

## 2022-02-09 DIAGNOSIS — F3289 Other specified depressive episodes: Secondary | ICD-10-CM | POA: Diagnosis not present

## 2022-02-09 DIAGNOSIS — Z6837 Body mass index (BMI) 37.0-37.9, adult: Secondary | ICD-10-CM | POA: Diagnosis not present

## 2022-02-09 MED ORDER — BUPROPION HCL ER (XL) 150 MG PO TB24: 450.0000 mg | ORAL_TABLET | Freq: Every day | ORAL | 0 refills | Status: DC

## 2022-02-09 MED ORDER — BUPROPION HCL ER (XL) 150 MG PO TB24
450.0000 mg | ORAL_TABLET | Freq: Every day | ORAL | 0 refills | Status: DC
  Filled 2022-02-09 – 2022-03-05 (×3): qty 270, 90d supply, fill #0

## 2022-02-09 MED ORDER — WEGOVY 2.4 MG/0.75ML ~~LOC~~ SOAJ
2.4000 mg | SUBCUTANEOUS | 0 refills | Status: DC
  Filled 2022-02-09 – 2022-02-15 (×4): qty 9, 84d supply, fill #0

## 2022-02-09 MED ORDER — WEGOVY 2.4 MG/0.75ML ~~LOC~~ SOAJ: 2.4000 mg | SUBCUTANEOUS | 0 refills | Status: DC

## 2022-02-15 ENCOUNTER — Other Ambulatory Visit (HOSPITAL_COMMUNITY): Payer: Self-pay

## 2022-02-16 ENCOUNTER — Other Ambulatory Visit (HOSPITAL_COMMUNITY): Payer: Self-pay

## 2022-02-16 ENCOUNTER — Other Ambulatory Visit: Payer: Self-pay

## 2022-02-18 ENCOUNTER — Ambulatory Visit: Payer: 59 | Admitting: Family Medicine

## 2022-02-18 ENCOUNTER — Other Ambulatory Visit (HOSPITAL_COMMUNITY): Payer: Self-pay

## 2022-02-18 ENCOUNTER — Encounter: Payer: Self-pay | Admitting: Family Medicine

## 2022-02-18 VITALS — BP 140/80 | HR 71 | Temp 97.6°F | Ht 74.0 in | Wt 299.8 lb

## 2022-02-18 DIAGNOSIS — F3289 Other specified depressive episodes: Secondary | ICD-10-CM

## 2022-02-18 DIAGNOSIS — E291 Testicular hypofunction: Secondary | ICD-10-CM

## 2022-02-18 DIAGNOSIS — F329 Major depressive disorder, single episode, unspecified: Secondary | ICD-10-CM | POA: Diagnosis not present

## 2022-02-18 LAB — CBC
HCT: 45 % (ref 39.0–52.0)
Hemoglobin: 15.2 g/dL (ref 13.0–17.0)
MCHC: 33.8 g/dL (ref 30.0–36.0)
MCV: 88.8 fl (ref 78.0–100.0)
Platelets: 241 10*3/uL (ref 150.0–400.0)
RBC: 5.07 Mil/uL (ref 4.22–5.81)
RDW: 13.7 % (ref 11.5–15.5)
WBC: 7.7 10*3/uL (ref 4.0–10.5)

## 2022-02-18 MED ORDER — TESTOSTERONE CYPIONATE 200 MG/ML IM SOLN
200.0000 mg | INTRAMUSCULAR | 5 refills | Status: DC
  Filled 2022-02-18: qty 6, 84d supply, fill #0
  Filled 2022-06-15: qty 6, 84d supply, fill #1

## 2022-02-18 MED ORDER — VENLAFAXINE HCL ER 150 MG PO CP24
150.0000 mg | ORAL_CAPSULE | Freq: Every day | ORAL | 2 refills | Status: DC
  Filled 2022-02-18: qty 30, 30d supply, fill #0
  Filled 2022-03-15: qty 30, 30d supply, fill #1
  Filled 2022-04-14: qty 30, 30d supply, fill #2

## 2022-02-18 NOTE — Progress Notes (Signed)
Established Patient Office Visit   Subjective:  Patient ID: Phillip Roach, male    DOB: 1980-03-25  Age: 41 y.o. MRN: [id number]  Chief Complaint  Patient presents with   Medication Problem    Discuss increasing medication feels as if Effexor is not managing depression.     HPI Encounter Diagnoses  Name Primary?   Other depression Yes   Androgen deficiency    Follow-up of depression status post weaning off sertraline and initiation Effexor with continued Wellbutrin.  Feels as though Effexor is helping but he is not clear if he would like to be with his mood.  Sertraline seemed to help more but side effects or problematic.  He wonders about a higher dose.  No organized exercise.  He is active at work.  He is active around his house and he walks his dogs.  Chart review shows a hemoglobin 3 months ago at 16.4.  Continues with androgen therapy.  He is due for a power red donation.  Continues to sleep heavily, at least 8 hours daily.   Review of Systems  Constitutional: Negative.   HENT: Negative.    Eyes:  Negative for blurred vision, discharge and redness.  Respiratory: Negative.    Cardiovascular: Negative.   Gastrointestinal:  Negative for abdominal pain.  Genitourinary: Negative.   Musculoskeletal: Negative.  Negative for myalgias.  Skin:  Negative for rash.  Neurological:  Negative for tingling, loss of consciousness and weakness.  Endo/Heme/Allergies:  Negative for polydipsia.     Current Outpatient Medications:    acetaminophen (TYLENOL) 500 MG tablet, Take 1 tablet (500 mg total) by mouth every 6 (six) hours as needed. (Patient taking differently: Take 1,000 mg by mouth every 6 (six) hours as needed for moderate pain or headache.), Disp: 30 tablet, Rfl: 0   b complex vitamins tablet, Take 2 tablets by mouth daily., Disp: , Rfl:    buPROPion (WELLBUTRIN XL) 150 MG 24 hr tablet, Take 3 tablets (450 mg total) by mouth daily., Disp: 270 tablet, Rfl: 0   guaifenesin  (HUMIBID E) 400 MG TABS tablet, Take 1 tablet 3 times daily as needed for chest congestion and cough, Disp: 21 tablet, Rfl: 0   ibuprofen (ADVIL,MOTRIN) 200 MG tablet, Take 800 mg by mouth every 6 (six) hours as needed for pain., Disp: , Rfl:    loratadine (CLARITIN) 10 MG tablet, Take 10 mg by mouth daily., Disp: , Rfl:    Semaglutide-Weight Management (WEGOVY) 2.4 MG/0.75ML SOAJ, Inject 2.4 mg into the skin once a week., Disp: 9 mL, Rfl: 0   triamcinolone (NASACORT) 55 MCG/ACT AERO nasal inhaler, Place 1 spray into the nose daily., Disp: , Rfl:    venlafaxine XR (EFFEXOR XR) 150 MG 24 hr capsule, Take 1 capsule (150 mg total) by mouth daily with breakfast., Disp: 30 capsule, Rfl: 2   testosterone cypionate (DEPOTESTOSTERONE CYPIONATE) 200 MG/ML injection, Inject 1 mL (200 mg total) into the muscle every 14 (fourteen) days., Disp: 10 mL, Rfl: 5   Objective:     BP (!) 140/80 (BP Location: Right Arm, Patient Position: Sitting, Cuff Size: Large)   Pulse 71   Temp 97.6 F (36.4 C) (Temporal)   Ht '6\' 2"'$  (1.88 m)   Wt 299 lb 12.8 oz (136 kg)   SpO2 98%   BMI 38.49 kg/m  BP Readings from Last 3 Encounters:  02/18/22 (!) 140/80  02/09/22 137/75  01/18/22 (!) 149/99   Wt Readings from Last 3 Encounters:  02/18/22  299 lb 12.8 oz (136 kg)  02/09/22 290 lb (131.5 kg)  12/22/21 300 lb (136.1 kg)      Physical Exam Constitutional:      General: He is not in acute distress.    Appearance: Normal appearance. He is not ill-appearing, toxic-appearing or diaphoretic.  HENT:     Head: Normocephalic and atraumatic.     Right Ear: External ear normal.     Left Ear: External ear normal.  Eyes:     General: No scleral icterus.       Right eye: No discharge.        Left eye: No discharge.     Extraocular Movements: Extraocular movements intact.     Conjunctiva/sclera: Conjunctivae normal.  Pulmonary:     Effort: Pulmonary effort is normal. No respiratory distress.  Skin:    General: Skin is  warm and dry.  Neurological:     Mental Status: He is alert and oriented to person, place, and time.  Psychiatric:        Mood and Affect: Mood normal.        Behavior: Behavior normal.      No results found for any visits on 02/18/22.    The 10-year ASCVD risk score (Arnett DK, et al., 2019) is: 1.2%    Assessment & Plan:   Other depression -     Venlafaxine HCl ER; Take 1 capsule (150 mg total) by mouth daily with breakfast.  Dispense: 30 capsule; Refill: 2  Androgen deficiency -     Testosterone Cypionate; Inject 1 mL (200 mg total) into the muscle every 14 (fourteen) days.  Dispense: 10 mL; Refill: 5 -     CBC    Return in about 8 weeks (around 04/15/2022), or if symptoms worsen or fail to improve.  Have increased Effexor to 150 mg daily.  Continue with Wellbutrin.  He will schedule appointment for a power read donation.  Information was given on exercising.  This could help his mood as well.  [name], MD

## 2022-02-24 NOTE — Progress Notes (Signed)
Chief Complaint:   OBESITY Phillip Roach is here to discuss his progress with his obesity treatment plan along with follow-up of his obesity related diagnoses. Phillip Roach is on keeping a food journal and adhering to recommended goals of 2200 calories and 150+ grams of protein and states he is following his eating plan approximately 75% of the time. Phillip Roach states he is walking 80 minutes 4 times per week.  Today's visit was #: 89 Starting weight: 325 lbs Starting date: 09/23/2020 Today's weight: 290 lbs Today's date: 02/09/2022 Total lbs lost to date: 35 lbs Total lbs lost since last in-office visit: 7  Interim History: Phillip Roach has pneumonia since last appointment. He also has changed antidepressants. He is working at cath lab now. Has had more sugar recently due to events and food at work. Has had loss of appetite last month. Has been strapped financially over holiday season.   Subjective:   1. Low HDL (under 40) Phillip Roach is not on medication but last LDL within normal limits.  2. Prediabetes Phillip Roach is on GLP-1. Last A1c better controlled.  3. Vitamin D deficiency Phillip Roach is not on Vit D anymore. Notes fatigue but he has been ill on and off for last 3 months.  4. Other depression Phillip Roach is on Effexor now. Denies any side effects.  Assessment/Plan:   1. Low HDL (under 40) We will obtain labs today.  - Lipid Panel With LDL/HDL Ratio  2. Prediabetes We will obtain labs today.  - Comprehensive metabolic panel - Hemoglobin A1c - Insulin, random  3. Vitamin D deficiency We will obtain labs today.   - VITAMIN D 25 Hydroxy (Vit-D Deficiency, Fractures)  4. Other depression We will refill Wellbutrin XL 150 mg daily for 3 months with 0 refills.  -Refill buPROPion (WELLBUTRIN XL) 150 MG 24 hr tablet; Take 3 tablets (450 mg total) by mouth daily.  Dispense: 270 tablet; Refill: 0  5. Obesity with current BMI 37.3 We will refill Wegovy 2.4 mg SubQ once weekly for 1 month with 0  refills.  -Refill Semaglutide-Weight Management (WEGOVY) 2.4 MG/0.75ML SOAJ; Inject 2.4 mg into the skin once a week.  Dispense: 9 mL; Refill: 0  Phillip Roach is currently in the action stage of change. As such, his goal is to continue with weight loss efforts. He has agreed to keeping a food journal and adhering to recommended goals of 2200 calories and 150+grams of protein daily.   Exercise goals: All adults should avoid inactivity. Some physical activity is better than none, and adults who participate in any amount of physical activity gain some health benefits.  Behavioral modification strategies: increasing lean protein intake, meal planning and cooking strategies, keeping healthy foods in the home, and planning for success.  Phillip Roach has agreed to follow-up with our clinic in 4 weeks. He was informed of the importance of frequent follow-up visits to maximize his success with intensive lifestyle modifications for his multiple health conditions.   Phillip Roach was informed we would discuss his lab results at his next visit unless there is a critical issue that needs to be addressed sooner. Phillip Roach agreed to keep his next visit at the agreed upon time to discuss these results.  Objective:   Blood pressure 137/75, pulse 81, temperature 97.6 F (36.4 C), height '6\' 2"'$  (1.88 m), weight 290 lb (131.5 kg), SpO2 98 %. Body mass index is 37.23 kg/m.  General: Cooperative, alert, well developed, in no acute distress. HEENT: Conjunctivae and lids unremarkable. Cardiovascular: Regular rhythm.  Lungs:  Normal work of breathing. Neurologic: No focal deficits.   Lab Results  Component Value Date   CREATININE 1.21 11/16/2021   BUN 11 11/16/2021   NA 136 11/16/2021   K 3.8 11/16/2021   CL 101 11/16/2021   CO2 27 11/16/2021   Lab Results  Component Value Date   ALT 23 05/15/2020   AST 14 05/15/2020   ALKPHOS 50 05/15/2020   BILITOT 0.5 05/15/2020   Lab Results  Component Value Date   HGBA1C 5.1 05/07/2021    HGBA1C 5.6 01/20/2021   HGBA1C 5.2 10/22/2020   HGBA1C 5.7 (H) 02/25/2020   HGBA1C 5.7 (H) 09/24/2019   Lab Results  Component Value Date   INSULIN 22.4 07/29/2021   INSULIN 6.5 10/22/2020   INSULIN 9.7 02/25/2020   INSULIN 17.3 09/24/2019   Lab Results  Component Value Date   TSH 0.758 11/16/2021   Lab Results  Component Value Date   CHOL 142 07/29/2021   HDL 33 (L) 07/29/2021   LDLCALC 88 07/29/2021   LDLDIRECT 85.4 07/28/2007   TRIG 112 07/29/2021   CHOLHDL 5 06/27/2012   Lab Results  Component Value Date   VD25OH 57.1 06/24/2021   VD25OH 41.3 10/22/2020   VD25OH 37.6 02/25/2020   Lab Results  Component Value Date   WBC 7.7 02/18/2022   HGB 15.2 02/18/2022   HCT 45.0 02/18/2022   MCV 88.8 02/18/2022   PLT 241.0 02/18/2022   No results found for: "IRON", "TIBC", "FERRITIN"  Attestation Statements:   Reviewed by clinician on day of visit: allergies, medications, problem list, medical history, surgical history, family history, social history, and previous encounter notes.  I, Elnora Morrison, RMA am acting as transcriptionist for Coralie Common, MD.  I have reviewed the above documentation for accuracy and completeness, and I agree with the above. - Coralie Common, MD

## 2022-03-04 ENCOUNTER — Other Ambulatory Visit: Payer: Self-pay

## 2022-03-04 ENCOUNTER — Other Ambulatory Visit (HOSPITAL_COMMUNITY): Payer: Self-pay

## 2022-03-05 ENCOUNTER — Other Ambulatory Visit (HOSPITAL_COMMUNITY): Payer: Self-pay

## 2022-03-09 ENCOUNTER — Ambulatory Visit (INDEPENDENT_AMBULATORY_CARE_PROVIDER_SITE_OTHER): Payer: Self-pay | Admitting: Family Medicine

## 2022-03-17 ENCOUNTER — Ambulatory Visit (INDEPENDENT_AMBULATORY_CARE_PROVIDER_SITE_OTHER): Payer: Commercial Managed Care - PPO | Admitting: Physician Assistant

## 2022-03-17 ENCOUNTER — Encounter (INDEPENDENT_AMBULATORY_CARE_PROVIDER_SITE_OTHER): Payer: Self-pay | Admitting: Physician Assistant

## 2022-03-17 VITALS — BP 132/82 | HR 71 | Temp 98.3°F | Ht 74.0 in | Wt 294.0 lb

## 2022-03-17 DIAGNOSIS — G4733 Obstructive sleep apnea (adult) (pediatric): Secondary | ICD-10-CM | POA: Diagnosis not present

## 2022-03-17 DIAGNOSIS — R7303 Prediabetes: Secondary | ICD-10-CM

## 2022-03-17 DIAGNOSIS — Z6837 Body mass index (BMI) 37.0-37.9, adult: Secondary | ICD-10-CM

## 2022-03-17 DIAGNOSIS — E669 Obesity, unspecified: Secondary | ICD-10-CM | POA: Diagnosis not present

## 2022-03-24 NOTE — Progress Notes (Signed)
Chief Complaint:   OBESITY Phillip Roach is here to discuss his progress with his obesity treatment plan along with follow-up of his obesity related diagnoses. [name] is on keeping a food journal and adhering to recommended goals of 2200 calories and 150+ grams of protein and states he is following his eating plan approximately 50% of the time. [name] states he is exercising 0 minutes 0 times per week.  Today's visit was #: 20 Starting weight: 325 lbs Starting date: 09/23/2020 Today's weight: 294 lbs Today's date: 03/17/2022 Total lbs lost to date: 31 lbs Total lbs lost since last in-office visit: 0  Interim History: Phillip Roach has done well with weight loss overall. He does well with journaling and reports getting a lot of protein from eggs.  He occasionally has difficulty meeting his protein goals however.  Reports he has not been sleeping as well the last couple of weeks.  He does take call for the [facility] and also has young children and his sleep has been more disrupted over the past several weeks.  Subjective:   1. Obstructive sleep apnea syndrome Merik normally using Nasacort/Claritin to help decrease mechanical symptoms of obstruction but skipped doses recently and notices a difference--increase symptoms of OSA.  2. Prediabetes Last A1c at 5.1 on 05/07/21-at goal and insulin at 22.4-not at goal on 07/29/21.   On GLP-1, Wegovy 2.4 mg weekly--Denies any side effects except occasional rare diarrhea.  Assessment/Plan:   1. Obstructive sleep apnea syndrome Encouraged to resume usual medications - Nasacort and Claritin.  Discussed that decrease oxygenation at night may decrease his metabolic rate and make losing weight more difficult.  Will monitor back on usual medications.  2. Prediabetes Continue GLP-1, Wegovy 2.4 mg weekly.  He continues to work on decreasing simple carbohydrates, increasing lean protein and exercise to promote weight loss and improved glycemic control.  Plan to recheck labs  over the next 1-2 months.  3. Obesity with current BMI 37.8 [name] is currently in the action stage of change. As such, his goal is to continue with weight loss efforts. He has agreed to keeping a food journal and adhering to recommended goals of 2200 calories and 150 grams of protein daily.   Exercise goals: All adults should avoid inactivity. Some physical activity is better than none, and adults who participate in any amount of physical activity gain some health benefits.  Behavioral modification strategies: increasing lean protein intake, decreasing simple carbohydrates, decreasing alcohol intake, and planning for success.  Homer has agreed to follow-up with our clinic in 4 weeks. He was informed of the importance of frequent follow-up visits to maximize his success with intensive lifestyle modifications for his multiple health conditions.   Objective:   Blood pressure 132/82, pulse 71, temperature 98.3 F (36.8 C), height '6\' 2"'$  (1.88 m), weight 294 lb (133.4 kg), SpO2 97 %. Body mass index is 37.75 kg/m.  General: Cooperative, alert, well developed, in no acute distress. HEENT: Conjunctivae and lids unremarkable. Cardiovascular: Regular rhythm.  Lungs: Normal work of breathing. Neurologic: No focal deficits.   Lab Results  Component Value Date   CREATININE 1.21 11/16/2021   BUN 11 11/16/2021   NA 136 11/16/2021   K 3.8 11/16/2021   CL 101 11/16/2021   CO2 27 11/16/2021   Lab Results  Component Value Date   ALT 23 05/15/2020   AST 14 05/15/2020   ALKPHOS 50 05/15/2020   BILITOT 0.5 05/15/2020   Lab Results  Component Value Date  HGBA1C 5.1 05/07/2021   HGBA1C 5.6 01/20/2021   HGBA1C 5.2 10/22/2020   HGBA1C 5.7 (H) 02/25/2020   HGBA1C 5.7 (H) 09/24/2019   Lab Results  Component Value Date   INSULIN 22.4 07/29/2021   INSULIN 6.5 10/22/2020   INSULIN 9.7 02/25/2020   INSULIN 17.3 09/24/2019   Lab Results  Component Value Date   TSH 0.758 11/16/2021   Lab  Results  Component Value Date   CHOL 142 07/29/2021   HDL 33 (L) 07/29/2021   LDLCALC 88 07/29/2021   LDLDIRECT 85.4 07/28/2007   TRIG 112 07/29/2021   CHOLHDL 5 06/27/2012   Lab Results  Component Value Date   VD25OH 57.1 06/24/2021   VD25OH 41.3 10/22/2020   VD25OH 37.6 02/25/2020   Lab Results  Component Value Date   WBC 7.7 02/18/2022   HGB 15.2 02/18/2022   HCT 45.0 02/18/2022   MCV 88.8 02/18/2022   PLT 241.0 02/18/2022   No results found for: "IRON", "TIBC", "FERRITIN"  Attestation Statements:   Reviewed by clinician on day of visit: allergies, medications, problem list, medical history, surgical history, family history, social history, and previous encounter notes.  I, Brendell Tyus, am acting as transcriptionist for [employer], [place].  I have reviewed the above documentation for accuracy and completeness, and I agree with the above. -  Judas Mohammad,PA-C

## 2022-04-15 ENCOUNTER — Ambulatory Visit: Payer: Commercial Managed Care - PPO | Admitting: Family Medicine

## 2022-04-15 ENCOUNTER — Encounter: Payer: Self-pay | Admitting: Family Medicine

## 2022-04-15 VITALS — BP 122/76 | HR 74 | Temp 98.0°F | Ht 74.0 in | Wt 301.4 lb

## 2022-04-15 DIAGNOSIS — Z6841 Body Mass Index (BMI) 40.0 and over, adult: Secondary | ICD-10-CM

## 2022-04-15 DIAGNOSIS — E291 Testicular hypofunction: Secondary | ICD-10-CM

## 2022-04-15 DIAGNOSIS — R7303 Prediabetes: Secondary | ICD-10-CM

## 2022-04-15 DIAGNOSIS — F3289 Other specified depressive episodes: Secondary | ICD-10-CM | POA: Diagnosis not present

## 2022-04-15 NOTE — Progress Notes (Signed)
Established Patient Office Visit   Subjective:  Patient ID: Phillip Roach, male    DOB: 05/30/1980  Age: 42 y.o. MRN: [id number]  No chief complaint on file.   HPI Encounter Diagnoses  Name Primary?   Prediabetes Yes   Androgen deficiency    Other depression    Obesity with current BMI of 38.3    For follow-up of above.  Continues follow-up with weight loss management.  More recently he has plateaued with weight loss.  He continues with semaglutide for obesity and prediabetes.  Diabetes well-controlled.  He has found it difficult to regulate food intake.  Currently working 10-hour days.  It has been difficult for him to find the time and energy for any type of exercise between his work schedule and family responsibilities.  Continues with 200 mg of Depo testosterone every 2 weeks.   Review of Systems  Constitutional: Negative.   HENT: Negative.    Eyes:  Negative for blurred vision, discharge and redness.  Respiratory: Negative.    Cardiovascular: Negative.   Gastrointestinal:  Negative for abdominal pain.  Genitourinary: Negative.   Musculoskeletal: Negative.  Negative for myalgias.  Skin:  Negative for rash.  Neurological:  Negative for tingling, loss of consciousness and weakness.  Endo/Heme/Allergies:  Negative for polydipsia.      04/15/2022    8:28 AM 02/18/2022   11:47 AM 02/18/2022   10:33 AM  Depression screen PHQ 2/9  Decreased Interest 0 0 0  Down, Depressed, Hopeless 0 0 0  PHQ - 2 Score 0 0 0  Altered sleeping  1   Tired, decreased energy  3   Change in appetite  1   Feeling bad or failure about yourself   0   Trouble concentrating  1   Moving slowly or fidgety/restless  0   Suicidal thoughts  0   PHQ-9 Score  6   Difficult doing work/chores  Somewhat difficult Not difficult at all       Current Outpatient Medications:    acetaminophen (TYLENOL) 500 MG tablet, Take 1 tablet (500 mg total) by mouth every 6 (six) hours as needed. (Patient taking  differently: Take 1,000 mg by mouth every 6 (six) hours as needed for moderate pain or headache.), Disp: 30 tablet, Rfl: 0   b complex vitamins tablet, Take 2 tablets by mouth daily., Disp: , Rfl:    buPROPion (WELLBUTRIN XL) 150 MG 24 hr tablet, Take 3 tablets (450 mg total) by mouth daily., Disp: 270 tablet, Rfl: 0   ibuprofen (ADVIL,MOTRIN) 200 MG tablet, Take 800 mg by mouth every 6 (six) hours as needed for pain., Disp: , Rfl:    loratadine (CLARITIN) 10 MG tablet, Take 10 mg by mouth daily., Disp: , Rfl:    Semaglutide-Weight Management (WEGOVY) 2.4 MG/0.75ML SOAJ, Inject 2.4 mg into the skin once a week., Disp: 9 mL, Rfl: 0   testosterone cypionate (DEPOTESTOSTERONE CYPIONATE) 200 MG/ML injection, Inject 1 mL (200 mg total) into the muscle every 14 (fourteen) days., Disp: 10 mL, Rfl: 5   triamcinolone (NASACORT) 55 MCG/ACT AERO nasal inhaler, Place 1 spray into the nose daily., Disp: , Rfl:    venlafaxine XR (EFFEXOR XR) 150 MG 24 hr capsule, Take 1 capsule (150 mg total) by mouth daily with breakfast., Disp: 30 capsule, Rfl: 2   Objective:     BP 122/76 (BP Location: Right Arm, Patient Position: Sitting, Cuff Size: Large)   Pulse 74   Temp 98 F (36.7 C) (  Temporal)   Ht '6\' 2"'$  (1.88 m)   Wt (!) 301 lb 6.4 oz (136.7 kg)   SpO2 97%   BMI 38.70 kg/m    Physical Exam Constitutional:      General: He is not in acute distress.    Appearance: Normal appearance. He is not ill-appearing, toxic-appearing or diaphoretic.  HENT:     Head: Normocephalic and atraumatic.     Right Ear: External ear normal.     Left Ear: External ear normal.  Eyes:     General: No scleral icterus.       Right eye: No discharge.        Left eye: No discharge.     Extraocular Movements: Extraocular movements intact.     Conjunctiva/sclera: Conjunctivae normal.  Pulmonary:     Effort: Pulmonary effort is normal. No respiratory distress.  Skin:    General: Skin is warm and dry.  Neurological:     Mental  Status: He is alert and oriented to person, place, and time.  Psychiatric:        Mood and Affect: Mood normal.        Behavior: Behavior normal.      No results found for any visits on 04/15/22.    The 10-year ASCVD risk score (Arnett DK, et al., 2019) is: 0.9%    Assessment & Plan:   Prediabetes  Androgen deficiency  Other depression  Obesity with current BMI of 38.3    Return in about 3 months (around 07/14/2022), or Continue current medications try to exercise by walking for 30 minutes at least 5 days weekly.[name], MD

## 2022-04-20 ENCOUNTER — Ambulatory Visit (INDEPENDENT_AMBULATORY_CARE_PROVIDER_SITE_OTHER): Payer: Commercial Managed Care - PPO | Admitting: Family Medicine

## 2022-04-21 ENCOUNTER — Encounter (INDEPENDENT_AMBULATORY_CARE_PROVIDER_SITE_OTHER): Payer: Self-pay | Admitting: Physician Assistant

## 2022-04-21 ENCOUNTER — Ambulatory Visit (INDEPENDENT_AMBULATORY_CARE_PROVIDER_SITE_OTHER): Payer: Commercial Managed Care - PPO | Admitting: Physician Assistant

## 2022-04-21 VITALS — BP 129/82 | HR 64 | Temp 98.1°F | Ht 74.0 in | Wt 298.0 lb

## 2022-04-21 DIAGNOSIS — R7303 Prediabetes: Secondary | ICD-10-CM

## 2022-04-21 DIAGNOSIS — Z6838 Body mass index (BMI) 38.0-38.9, adult: Secondary | ICD-10-CM

## 2022-04-21 DIAGNOSIS — F3289 Other specified depressive episodes: Secondary | ICD-10-CM | POA: Diagnosis not present

## 2022-04-21 DIAGNOSIS — E669 Obesity, unspecified: Secondary | ICD-10-CM | POA: Diagnosis not present

## 2022-04-21 NOTE — Progress Notes (Unsigned)
Chief Complaint:   OBESITY Phillip Roach is here to discuss his progress with his obesity treatment plan along with follow-up of his obesity related diagnoses. Phillip Roach is on keeping a food journal and adhering to recommended goals of 2200 calories and 150+grams of protein and states he is following his eating plan approximately 25% of the time. Phillip Roach states he is not consistently exercising 0 minutes 0 times per week.  Today's visit was #: 54 Starting weight: 325 lbs Starting date: 09/23/2020 Today's weight: 298 lbs Today's date: 04/21/2022 Total lbs lost to date: 31 lbs Total lbs lost since last in-office visit: +4 lbs  Interim History: Phillip Roach has struggled to stay on his nutrition plan over the past month. He feels like he is more stressed in general and his mood is not as good lately/feels more irritable.  Hunger- reports more cravings recently. On Wegovy 2.4 mg weekly since 10/2021. No side effects from [facility] We did discuss other options including changing to Zepbound . He currently has 5 doses of Wegovy 2.4 mg.  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   Subjective:   1. Prediabetes On GLP-1, Wegovy at max dose since 10/2021. Reports was helping to decrease hunger/cravings, but less so more recently.  Last A1c 5.1- at goal 05/07/21/Insulin 22.4- not at goal. No side effects with Wegovy.   2. Other depression He fees like he is more stressed and his mood is not as good lately/feels more irritable. 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   3. Obesity ([place])- Starting BMI 41.73  4. BMI 38.0-38.9,adult, Current BMI 38.3    Assessment/Plan:   1.  Prediabetes Continue GLP-1 Wegovy 2.4 mg weekly. Continue nutrition plan to decrease simple carbohydrates, increase lean protein and exercise to promote weight loss and improve glycemic control and prevent progression to Type 2 diabetes. We did discuss switching to tirzepatide as well and will plan to discuss further at the next visit.   2. Other depression He has been working with his PCP- Dr. Ethelene Hal to address his depressed mood. He does not feel like Effexor is as effective as the sertraline. He denies SI/HI . He is also on bupropion SR 450 mg daily at breakfast. He is concerned he may be on too much medication now.   We discussed decreasing the bupropion SR to 300 mg daily to see if this improves his sleep and overall improves his mood.  He has worked with Dr. Mallie Mussel previously and we discussed referral today, but he would like to hold off on referral and we can discuss further at the next visit.   3. Obesity ([place])- Starting BMI 41.73  4.BMI 38.0-38.9,adult, Current BMI 38.3  Phillip Roach is currently in the action stage of change. As such, his goal is to continue with weight loss efforts. He has agreed to keeping a food journal and adhering to recommended goals of 2200 calories and 150+ grams of protein.   Exercise goals: All adults should avoid inactivity. Some physical activity is better than none, and adults who participate in any amount of physical activity gain some health benefits.  Behavioral modification strategies: increasing lean protein intake, decreasing simple carbohydrates, and keeping a strict food journal.  Phillip Roach has agreed to follow-up with our clinic in 3 weeks. He was informed of the importance of frequent follow-up visits to maximize his success with intensive lifestyle modifications for his multiple health conditions.    Objective:   Blood pressure 129/82, pulse 64, temperature 98.1 F (36.7 C), height 6' 2"$  (1.88 m), weight 298 lb (135.2 kg), SpO2 100 %. Body mass index  is 38.26 kg/m.  General: Cooperative, alert, well developed, in no acute distress. HEENT: Conjunctivae and lids unremarkable. Cardiovascular: Regular rhythm.  Lungs: Normal work of breathing. Neurologic: No focal deficits.   Lab Results  Component Value Date   CREATININE 1.21 11/16/2021   BUN 11 11/16/2021   NA 136 11/16/2021   K 3.8 11/16/2021   CL 101 11/16/2021   CO2 27 11/16/2021   Lab Results  Component Value Date   ALT 23 05/15/2020   AST 14 05/15/2020   ALKPHOS 50 05/15/2020   BILITOT 0.5 05/15/2020   Lab Results  Component Value Date   HGBA1C 5.1 05/07/2021   HGBA1C 5.6 01/20/2021   HGBA1C 5.2 10/22/2020   HGBA1C 5.7 (H) 02/25/2020   HGBA1C 5.7 (H) 09/24/2019   Lab Results  Component Value Date   INSULIN 22.4 07/29/2021   INSULIN 6.5 10/22/2020   INSULIN 9.7 02/25/2020   INSULIN 17.3 09/24/2019   Lab Results  Component Value Date   TSH 0.758 11/16/2021   Lab Results  Component Value Date   CHOL 142 07/29/2021   HDL 33 (L) 07/29/2021   LDLCALC 88 07/29/2021   LDLDIRECT 85.4 07/28/2007   TRIG 112 07/29/2021   CHOLHDL 5 06/27/2012   Lab Results  Component Value Date   VD25OH 57.1 06/24/2021   VD25OH 41.3 10/22/2020   VD25OH 37.6 02/25/2020   Lab Results  Component Value Date   WBC 7.7 02/18/2022   HGB 15.2 02/18/2022   HCT 45.0 02/18/2022   MCV 88.8 02/18/2022   PLT 241.0 02/18/2022   No results found for: "IRON", "TIBC", "FERRITIN"  Obesity Behavioral Intervention:   Approximately 15 minutes were spent on the discussion below.  ASK: We discussed the diagnosis of obesity with Phillip Roach today and Phillip Roach agreed to give [place] permission to discuss obesity behavioral modification therapy today.  ASSESS: Phillip Roach has the diagnosis of obesity and his BMI today is 38.3. Phillip Roach is in the action stage of change.   ADVISE: Phillip Roach was educated on the multiple health risks of obesity as well as the benefit of weight loss to improve his health. He was  advised of the need for long term treatment and the importance of lifestyle modifications to improve his current health and to decrease his risk of future health problems.  AGREE: Multiple dietary modification options and treatment options were discussed and Phillip Roach agreed to follow the recommendations documented in the above note.  ARRANGE: Phillip Roach was educated on the importance of frequent visits to treat obesity as outlined per CMS and USPSTF guidelines and agreed to schedule his next follow up appointment today.  Attestation Statements:   Reviewed by clinician on day of visit: allergies, medications, problem list, medical history, surgical history, family history, social history, and previous encounter notes.  Shaquil Aldana,PA-C

## 2022-04-30 ENCOUNTER — Other Ambulatory Visit (HOSPITAL_COMMUNITY): Payer: Self-pay

## 2022-04-30 MED ORDER — AMOXICILLIN 500 MG PO CAPS
ORAL_CAPSULE | ORAL | 1 refills | Status: DC
  Filled 2022-04-30: qty 21, 7d supply, fill #0

## 2022-04-30 MED ORDER — CHLORHEXIDINE GLUCONATE 0.12 % MT SOLN
Freq: Two times a day (BID) | OROMUCOSAL | 0 refills | Status: DC
  Filled 2022-04-30: qty 473, 16d supply, fill #0

## 2022-04-30 MED ORDER — CLINDAMYCIN HCL 300 MG PO CAPS
ORAL_CAPSULE | ORAL | 1 refills | Status: AC
  Filled 2022-04-30: qty 21, 7d supply, fill #0

## 2022-04-30 MED ORDER — DIAZEPAM 5 MG PO TABS
5.0000 mg | ORAL_TABLET | ORAL | 0 refills | Status: DC
  Filled 2022-04-30: qty 2, 1d supply, fill #0

## 2022-05-17 ENCOUNTER — Encounter (INDEPENDENT_AMBULATORY_CARE_PROVIDER_SITE_OTHER): Payer: Self-pay

## 2022-05-17 ENCOUNTER — Ambulatory Visit (INDEPENDENT_AMBULATORY_CARE_PROVIDER_SITE_OTHER): Payer: Commercial Managed Care - PPO | Admitting: Physician Assistant

## 2022-05-18 DIAGNOSIS — F329 Major depressive disorder, single episode, unspecified: Secondary | ICD-10-CM | POA: Diagnosis not present

## 2022-05-20 ENCOUNTER — Other Ambulatory Visit (HOSPITAL_COMMUNITY): Payer: Self-pay

## 2022-05-20 ENCOUNTER — Other Ambulatory Visit: Payer: Self-pay | Admitting: Family Medicine

## 2022-05-20 DIAGNOSIS — F3289 Other specified depressive episodes: Secondary | ICD-10-CM

## 2022-05-20 MED ORDER — VENLAFAXINE HCL ER 150 MG PO CP24
150.0000 mg | ORAL_CAPSULE | Freq: Every day | ORAL | 2 refills | Status: DC
  Filled 2022-05-20: qty 30, 30d supply, fill #0
  Filled 2022-06-15: qty 30, 30d supply, fill #1
  Filled 2022-07-19: qty 30, 30d supply, fill #2

## 2022-06-03 ENCOUNTER — Other Ambulatory Visit (HOSPITAL_COMMUNITY): Payer: Self-pay

## 2022-06-03 ENCOUNTER — Ambulatory Visit (INDEPENDENT_AMBULATORY_CARE_PROVIDER_SITE_OTHER): Payer: Commercial Managed Care - PPO | Admitting: Physician Assistant

## 2022-06-03 ENCOUNTER — Encounter (INDEPENDENT_AMBULATORY_CARE_PROVIDER_SITE_OTHER): Payer: Self-pay | Admitting: Physician Assistant

## 2022-06-03 VITALS — BP 126/82 | HR 83 | Temp 98.4°F | Ht 74.0 in | Wt 311.0 lb

## 2022-06-03 DIAGNOSIS — F3289 Other specified depressive episodes: Secondary | ICD-10-CM | POA: Diagnosis not present

## 2022-06-03 DIAGNOSIS — E669 Obesity, unspecified: Secondary | ICD-10-CM | POA: Diagnosis not present

## 2022-06-03 DIAGNOSIS — R7303 Prediabetes: Secondary | ICD-10-CM

## 2022-06-03 DIAGNOSIS — Z6841 Body Mass Index (BMI) 40.0 and over, adult: Secondary | ICD-10-CM | POA: Insufficient documentation

## 2022-06-03 MED ORDER — WEGOVY 2.4 MG/0.75ML ~~LOC~~ SOAJ
2.4000 mg | SUBCUTANEOUS | 0 refills | Status: DC
  Filled 2022-06-03: qty 3, 28d supply, fill #0

## 2022-06-03 MED ORDER — BUPROPION HCL ER (XL) 150 MG PO TB24
450.0000 mg | ORAL_TABLET | Freq: Every day | ORAL | 0 refills | Status: DC
  Filled 2022-06-03: qty 270, 90d supply, fill #0

## 2022-06-03 NOTE — Progress Notes (Signed)
Office: [phone]  /  Fax: [phone]  WEIGHT SUMMARY AND BIOMETRICS  Vitals Temp: 98.4 F (36.9 C) BP: 126/82 Pulse Rate: 83 SpO2: 97 %   Anthropometric Measurements Height: 6\' 2"  (1.88 m) Weight: (!) 311 lb (141.1 kg) BMI (Calculated): 39.91 Weight at Last Visit: 298 lb Weight Lost Since Last Visit: 0 lb Weight Gained Since Last Visit: 13 lb Starting Weight: 325 lb   Body Composition  Body Fat %: 33.7 % Fat Mass (lbs): 105 lbs Muscle Mass (lbs): 196.6 lbs Total Body Water (lbs): 145.4 lbs Visceral Fat Rating : 18   Other Clinical Data Fasting: No Labs: No Today's Visit #: 41 Starting Date: 09/24/19     HPI  Chief Complaint: OBESITY  Phillip Roach is here to discuss his progress with his obesity treatment plan. He is on the keeping a food journal and adhering to recommended goals of 2400 calories and 140 grams protein and states he is following his eating plan approximately 0 % of the time. He states he is exercising 0 minutes 0 times per week.   Interval History:  Since last office visit he is up 13 pounds. Bioimpedance scale: Up 5.6 pounds muscle mass/7.4 pounds adipose mass/6.4 pounds total body water Reports hunger and appetite are well-controlled when working. 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    Pharmacotherapy: Wegovy 2.4 mg weekly.  He is having some recent increase in constipation, previously took some Metamucil which was helpful but has not been consistently taking this and we discussed getting back onto it.  There is no other side effects from the [facility].  PHYSICAL EXAM:  Blood pressure 126/82, pulse 83, temperature 98.4 F (36.9 C),  height 6\' 2"  (1.88 m), weight (!) 311 lb (141.1 kg), SpO2 97 %. Body mass index is 39.93 kg/m.  General: He is overweight, cooperative, alert, well developed, and in no acute distress. PSYCH: Has normal mood, affect and thought process.   Cardiovascular: Regular rhythm Lungs: Normal breathing effort, no conversational dyspnea.  DIAGNOSTIC DATA REVIEWED:  BMET    Component Value Date/Time   NA 136 11/16/2021 1746   K 3.8 11/16/2021 1746   CL 101 11/16/2021 1746   CO2 27 11/16/2021 1746   GLUCOSE 89 11/16/2021 1746   BUN 11 11/16/2021 1746   CREATININE 1.21 11/16/2021 1746   CALCIUM 9.2 11/16/2021 1746   GFRNONAA >60 11/16/2021 1746   GFRAA >60 07/16/2019 1958   Lab Results  Component Value Date   HGBA1C 5.1 05/07/2021   HGBA1C 5.7 (H) 09/24/2019   Lab Results  Component Value Date   INSULIN 22.4 07/29/2021   INSULIN 17.3 09/24/2019   Lab Results  Component Value Date   TSH 0.758 11/16/2021   CBC    Component Value Date/Time   WBC 7.7 02/18/2022 1108   RBC 5.07 02/18/2022 1108   HGB 15.2 02/18/2022 1108   HGB 15.6 10/22/2020 1138   HCT 45.0 02/18/2022 1108   HCT 46.9 10/22/2020 1138   PLT 241.0 02/18/2022 1108   PLT 263 10/22/2020 1138   MCV 88.8 02/18/2022 1108   MCV 86 10/22/2020 1138   MCH 29.4  11/16/2021 1746   MCHC 33.8 02/18/2022 1108   RDW 13.7 02/18/2022 1108   RDW 12.5 10/22/2020 1138   Iron Studies No results found for: "IRON", "TIBC", "FERRITIN", "IRONPCTSAT" Lipid Panel     Component Value Date/Time   CHOL 142 07/29/2021 0837   TRIG 112 07/29/2021 0837   HDL 33 (L) 07/29/2021 0837   CHOLHDL 5 06/27/2012 0830   VLDL 22.4 06/27/2012 0830   LDLCALC 88 07/29/2021 0837   LDLDIRECT 85.4 07/28/2007 1500   Hepatic Function Panel     Component Value Date/Time   PROT 7.2 05/15/2020 0937   ALBUMIN 4.5 05/15/2020 0937   AST 14 05/15/2020 0937   ALT 23 05/15/2020 0937   ALKPHOS 50 05/15/2020 0937   BILITOT 0.5 05/15/2020 0937   BILIDIR 0.0  06/27/2012 0830      Component Value Date/Time   TSH 0.758 11/16/2021 1746   TSH 2.160 06/24/2021 1123   Nutritional Lab Results  Component Value Date   VD25OH 57.1 06/24/2021   VD25OH 41.3 10/22/2020   VD25OH 37.6 02/25/2020    ASSOCIATED CONDITIONS ADDRESSED TODAY  ASSESSMENT AND PLAN  Problem List Items Addressed This Visit     Obesity (BMI 30-39.9)   Relevant Medications   Semaglutide-Weight Management (WEGOVY) 2.4 MG/0.75ML SOAJ   Prediabetes - Primary    Prediabetes Last A1c was 5.1  Medication(s): Wegovy 2.4 mg SQ weekly No side effects with [id number].  Lab Results  Component Value Date   HGBA1C 5.1 05/07/2021   HGBA1C 5.6 01/20/2021   HGBA1C 5.2 10/22/2020   HGBA1C 5.7 (H) 02/25/2020   HGBA1C 5.7 (H) 09/24/2019   Lab Results  Component Value Date   INSULIN 22.4 07/29/2021   INSULIN 6.5 10/22/2020   INSULIN 9.7 02/25/2020   INSULIN 17.3 09/24/2019   Plan: Continue and refill Wegovy 2.4 mg SQ weekly Continue working on nutrition plan to decrease simple carbohydrates, increase lean proteins and exercise to promote weight loss, improve glycemic control and prevent progression to Type 2 diabetes.         Depression    Other depression/emotional eating Tharon has had issues with stress/emotional eating. Currently this is moderately controlled. Overall mood is stable.  Denies suicidal homicidal ideation.  He did decrease his bupropion to 300 mg daily from 450 mg daily and feels like he is doing better on the decreased dose.  Also continues on effects are 150 mg daily.  Continues seeing a counselor regularly at least once a month. Medication(s): Bupropion XL 300 mg daily  Plan: Continue Bupropion XL 300 mg daily Follow up with counselor as directed          Relevant Medications   buPROPion (WELLBUTRIN XL) 150 MG 24 hr tablet   BMI 40.0-44.9, adult ([facility]) Current BMI 40.0   Relevant Medications   Semaglutide-Weight Management (WEGOVY) 2.4 MG/0.75ML SOAJ    Obesity ([place])- Starting BMI 41.73   Relevant Medications   Semaglutide-Weight Management (WEGOVY) 2.4 MG/0.75ML SOAJ      TREATMENT PLAN FOR OBESITY:  Recommended Dietary Goals  Ryden is currently in the action stage of change. As such, his goal is to continue weight management plan. He has agreed to keeping a food journal and adhering to recommended goals of 2400 calories and 140+ grams of  protein.  Behavioral Intervention  We discussed the following Behavioral Modification Strategies today: increasing lean protein intake, decreasing simple carbohydrates , increasing fiber rich foods, avoiding skipping meals, increasing water intake, and emotional eating strategies and understanding  the difference between hunger signals and cravings.  Additional resources provided today: NA  Recommended Physical Activity Goals  Mazin has been advised to work up to 150 minutes of moderate intensity aerobic activity a week and strengthening exercises 2-3 times per week for cardiovascular health, weight loss maintenance and preservation of muscle mass.   He has agreed to Continue current level of physical activity    Pharmacotherapy We discussed various medication options to help Osvaldo with his weight loss efforts and we both agreed to continue Wegovy 2.4 mg weekly.  He is interested in discussing alternatives for weight loss over the next several weeks and feels that he may do better on a stimulant type medication and his next visit will be with a physician to discuss further.    Return in about 3 weeks (around 06/24/2022).[name] He was informed of the importance of frequent follow up visits to maximize his success with intensive lifestyle modifications for his multiple health conditions.   ATTESTASTION STATEMENTS:  Reviewed by clinician on day of visit: allergies, medications, problem list, medical history, surgical history, family history, social history, and previous encounter notes.   I have  personally spent 49 minutes total time today in preparation, patient care, nutritional counseling and documentation for this visit, including the following: review of clinical lab tests; review of medical tests/procedures/services.      Samora Jernberg, PA-C

## 2022-06-03 NOTE — Assessment & Plan Note (Signed)
Prediabetes Last A1c was 5.1  Medication(s): Wegovy 2.4 mg SQ weekly No side effects with Wegovy.  Lab Results  Component Value Date   HGBA1C 5.1 05/07/2021   HGBA1C 5.6 01/20/2021   HGBA1C 5.2 10/22/2020   HGBA1C 5.7 (H) 02/25/2020   HGBA1C 5.7 (H) 09/24/2019   Lab Results  Component Value Date   INSULIN 22.4 07/29/2021   INSULIN 6.5 10/22/2020   INSULIN 9.7 02/25/2020   INSULIN 17.3 09/24/2019    Plan: Continue and refill Wegovy 2.4 mg SQ weekly Continue working on nutrition plan to decrease simple carbohydrates, increase lean proteins and exercise to promote weight loss, improve glycemic control and prevent progression to Type 2 diabetes.

## 2022-06-03 NOTE — Assessment & Plan Note (Signed)
Other depression/emotional eating Phillip Roach has had issues with stress/emotional eating. Currently this is moderately controlled. Overall mood is stable.  Denies suicidal homicidal ideation.  He did decrease his bupropion to 300 mg daily from 450 mg daily and feels like he is doing better on the decreased dose.  Also continues on effects are 150 mg daily.  Continues seeing a counselor regularly at least once a month. Medication(s): Bupropion XL 300 mg daily  Plan: Continue Bupropion XL 300 mg daily Follow up with counselor as directed

## 2022-06-04 ENCOUNTER — Other Ambulatory Visit (HOSPITAL_COMMUNITY): Payer: Self-pay

## 2022-06-07 ENCOUNTER — Other Ambulatory Visit (HOSPITAL_COMMUNITY): Payer: Self-pay

## 2022-06-16 ENCOUNTER — Other Ambulatory Visit: Payer: Self-pay

## 2022-06-16 ENCOUNTER — Other Ambulatory Visit (HOSPITAL_COMMUNITY): Payer: Self-pay

## 2022-06-22 ENCOUNTER — Other Ambulatory Visit (HOSPITAL_COMMUNITY): Payer: Self-pay

## 2022-06-22 ENCOUNTER — Encounter (INDEPENDENT_AMBULATORY_CARE_PROVIDER_SITE_OTHER): Payer: Self-pay | Admitting: Family Medicine

## 2022-06-22 ENCOUNTER — Ambulatory Visit (INDEPENDENT_AMBULATORY_CARE_PROVIDER_SITE_OTHER): Payer: Commercial Managed Care - PPO | Admitting: Family Medicine

## 2022-06-22 VITALS — BP 137/76 | HR 81 | Temp 98.0°F | Ht 74.0 in | Wt 309.0 lb

## 2022-06-22 DIAGNOSIS — R632 Polyphagia: Secondary | ICD-10-CM | POA: Diagnosis not present

## 2022-06-22 DIAGNOSIS — Z6839 Body mass index (BMI) 39.0-39.9, adult: Secondary | ICD-10-CM | POA: Diagnosis not present

## 2022-06-22 DIAGNOSIS — F3289 Other specified depressive episodes: Secondary | ICD-10-CM | POA: Diagnosis not present

## 2022-06-22 DIAGNOSIS — E669 Obesity, unspecified: Secondary | ICD-10-CM

## 2022-06-22 MED ORDER — WEGOVY 2.4 MG/0.75ML ~~LOC~~ SOAJ
2.4000 mg | SUBCUTANEOUS | 0 refills | Status: DC
  Filled 2022-06-22: qty 9, 84d supply, fill #0
  Filled 2022-06-30: qty 3, 28d supply, fill #0

## 2022-06-22 MED ORDER — BUPROPION HCL ER (XL) 150 MG PO TB24
450.0000 mg | ORAL_TABLET | Freq: Every day | ORAL | 0 refills | Status: DC
  Filled 2022-06-22 – 2022-09-08 (×3): qty 270, 90d supply, fill #0

## 2022-06-23 NOTE — Progress Notes (Unsigned)
Chief Complaint:   OBESITY Phillip Roach is here to discuss his progress with his obesity treatment plan along with follow-up of his obesity related diagnoses. Phillip Roach is on keeping a food journal and adhering to recommended goals of 2400 calories and 140+ grams of protein and states he is following his eating plan approximately 0% of the time. Phillip Roach states he is doing 0 minutes 0 times per week.  Today's visit was #: 42 Starting weight: 325 lbs Starting date: 09/24/2019 Today's weight: 309 lbs Today's date: 06/22/2022 Total lbs lost to date: 16 Total lbs lost since last in-office visit: 2  Interim History: Phillip Roach has not been journaling, mostly trying to decrease junk food and simple carbohydrates.  Sweets have been an issue with some binge like episodes.  He notes increased temptations at work.  Subjective:   1. Polyphagia Phillip Roach is on [facility] and is working on his diet and weight loss.  He denies nausea or vomiting.  2. Emotional Eating Behavior Phillip Roach decreased his dose to 2 tablets/day and he feels his energy is better than when he was on 3 tablets/day.  Assessment/Plan:   1. Phillip Roach will continue Wegovy at 2.4 mg once weekly, and we will refill for 90 days.  - Semaglutide-Weight Management (WEGOVY) 2.4 MG/0.75ML SOAJ; Inject 2.4 mg into the skin once a week.  Dispense: 9 mL; Refill: 0  2. Emotional Eating Behavior We will refill Wellbutrin XL for 90 days.  Phillip Roach is okay to decrease his dose to 2 tablets daily.  - buPROPion (WELLBUTRIN XL) 150 MG 24 hr tablet; Take 3 tablets (450 mg total) by mouth daily.  Dispense: 270 tablet; Refill: 0  3. BMI 39.0-39.9,adult  4. Obesity, Beginning BMI 41.73 Phillip Roach is currently in the action stage of change. As such, his goal is to continue with weight loss efforts. He has agreed to keeping a food journal and adhering to recommended goals of 2400 calories and 100+ grams of protein daily.   We discussed what Phillip Roach is currently willing  to do for future weight loss.  We also discussed the importance of "real food" protein to help with thermogenesis.   Behavioral modification strategies: increasing water intake and dealing with family or coworker sabotage.  Phillip Roach has agreed to follow-up with our clinic in 4 weeks. He was informed of the importance of frequent follow-up visits to maximize his success with intensive lifestyle modifications for his multiple health conditions.   Objective:   Blood pressure 137/76, pulse 81, temperature 98 F (36.7 C), height  (1.88 m), weight (!) 309 lb (140.2 kg), SpO2 97 %. Body mass index is 39.67 kg/m.  Lab Results  Component Value Date   CREATININE 1.21 11/16/2021   BUN 11 11/16/2021   NA 136 11/16/2021   K 3.8 11/16/2021   CL 101 11/16/2021   CO2 27 11/16/2021   Lab Results  Component Value Date   ALT 23 05/15/2020   AST 14 05/15/2020   ALKPHOS 50 05/15/2020   BILITOT 0.5 05/15/2020   Lab Results  Component Value Date   HGBA1C 5.1 05/07/2021   HGBA1C 5.6 01/20/2021   HGBA1C 5.2 10/22/2020   HGBA1C 5.7 (H) 02/25/2020   HGBA1C 5.7 (H) 09/24/2019   Lab Results  Component Value Date   INSULIN 22.4 07/29/2021   INSULIN 6.5 10/22/2020   INSULIN 9.7 02/25/2020   INSULIN 17.3 09/24/2019   Lab Results  Component Value Date   TSH 0.758 11/16/2021   Lab Results  Component Value Date   CHOL 142 07/29/2021   HDL 33 (L) 07/29/2021   LDLCALC 88 07/29/2021   LDLDIRECT 85.4 07/28/2007   TRIG 112 07/29/2021   CHOLHDL 5 06/27/2012   Lab Results  Component Value Date   VD25OH 57.1 06/24/2021   VD25OH 41.3 10/22/2020   VD25OH 37.6 02/25/2020   Lab Results  Component Value Date   WBC 7.7 02/18/2022   HGB 15.2 02/18/2022   HCT 45.0 02/18/2022   MCV 88.8 02/18/2022   PLT 241.0 02/18/2022   No results found for: "IRON", "TIBC", "FERRITIN"  Attestation Statements:   Reviewed by clinician on day of visit: allergies, medications, problem list, medical history,  surgical history, family history, social history, and previous encounter notes.   I, Burt Knack, am acting as transcriptionist for Quillian Quince, MD.  I have reviewed the above documentation for accuracy and completeness, and I agree with the above. -  Quillian Quince, MD

## 2022-06-30 ENCOUNTER — Other Ambulatory Visit (HOSPITAL_COMMUNITY): Payer: Self-pay

## 2022-07-19 ENCOUNTER — Other Ambulatory Visit (HOSPITAL_COMMUNITY): Payer: Self-pay

## 2022-07-19 ENCOUNTER — Encounter (INDEPENDENT_AMBULATORY_CARE_PROVIDER_SITE_OTHER): Payer: Self-pay | Admitting: Family Medicine

## 2022-07-19 ENCOUNTER — Ambulatory Visit (INDEPENDENT_AMBULATORY_CARE_PROVIDER_SITE_OTHER): Payer: Commercial Managed Care - PPO | Admitting: Family Medicine

## 2022-07-19 VITALS — BP 130/84 | HR 83 | Temp 98.3°F | Ht 74.0 in | Wt 312.0 lb

## 2022-07-19 DIAGNOSIS — E7849 Other hyperlipidemia: Secondary | ICD-10-CM | POA: Diagnosis not present

## 2022-07-19 DIAGNOSIS — Z6841 Body Mass Index (BMI) 40.0 and over, adult: Secondary | ICD-10-CM

## 2022-07-19 DIAGNOSIS — E669 Obesity, unspecified: Secondary | ICD-10-CM

## 2022-07-19 DIAGNOSIS — F3341 Major depressive disorder, recurrent, in partial remission: Secondary | ICD-10-CM

## 2022-07-19 NOTE — Progress Notes (Signed)
Chief Complaint:   OBESITY Phillip Roach is here to discuss his progress with his obesity treatment plan along with follow-up of his obesity related diagnoses. Phillip Roach is on keeping a food journal and adhering to recommended goals of 2400 calories and 100+ grams of protein and states he is following his eating plan approximately 50% of the time. Phillip Roach states he is has been walking the dog a lot.    Today's visit was #: 43 Starting weight: 325 lbs Starting date: 09/24/2019 Today's weight: 312 lbs Today's date: 07/19/2022 Total lbs lost to date: 13 Total lbs lost since last in-office visit: 0  Interim History: Patient has been mostly focused on work- likes new position but demands are significant.  Feels a bit disappointed with Reginal Lutes being taken away.  Last three months he has felt a bit less motivated.  Felt his emotions have been more stabilized but sometimes he has unfortunately missed medication.   Subjective:   1. Other hyperlipidemia Keaton's last LDL was 88, HDL 33, and triglycerides 562.  2. Recurrent major depressive disorder, in partial remission (HCC) Tricia is on Effexor and Wellbutrin (has tried medications in the past).  He denies suicidal or homicidal ideations.  He is still having symptoms (could be exacerbating ADHD symptoms, cyclical interaction).  Assessment/Plan:   1. Other hyperlipidemia Rafay will continue mindful eating, and will have repeated labs in August.  2. Recurrent major depressive disorder, in partial remission (HCC) We will refer to psychiatry (medication management).   - Ambulatory referral to Psychiatry  3. BMI 40.0-44.9, adult (HCC)  4. Obesity, Beginning BMI 41.73 Phillip Roach is currently in the action stage of change. As such, his goal is to continue with weight loss efforts. He has agreed to practicing portion control and making smarter food choices, such as increasing vegetables and decreasing simple carbohydrates.   Exercise goals: All adults should  avoid inactivity. Some physical activity is better than none, and adults who participate in any amount of physical activity gain some health benefits.  Behavioral modification strategies: increasing lean protein intake, meal planning and cooking strategies, keeping healthy foods in the home, and planning for success.  Phillip Roach has agreed to follow-up with our clinic in 12 weeks. He was informed of the importance of frequent follow-up visits to maximize his success with intensive lifestyle modifications for his multiple health conditions.   Objective:   Blood pressure 130/84, pulse 83, temperature 98.3 F (36.8 C), height 6\' 2"  (1.88 m), weight (!) 312 lb (141.5 kg), SpO2 96 %. Body mass index is 40.06 kg/m.  General: Cooperative, alert, well developed, in no acute distress. HEENT: Conjunctivae and lids unremarkable. Cardiovascular: Regular rhythm.  Lungs: Normal work of breathing. Neurologic: No focal deficits.   Lab Results  Component Value Date   CREATININE 1.21 11/16/2021   BUN 11 11/16/2021   NA 136 11/16/2021   K 3.8 11/16/2021   CL 101 11/16/2021   CO2 27 11/16/2021   Lab Results  Component Value Date   ALT 23 05/15/2020   AST 14 05/15/2020   ALKPHOS 50 05/15/2020   BILITOT 0.5 05/15/2020   Lab Results  Component Value Date   HGBA1C 5.1 05/07/2021   HGBA1C 5.6 01/20/2021   HGBA1C 5.2 10/22/2020   HGBA1C 5.7 (H) 02/25/2020   HGBA1C 5.7 (H) 09/24/2019   Lab Results  Component Value Date   INSULIN 22.4 07/29/2021   INSULIN 6.5 10/22/2020   INSULIN 9.7 02/25/2020   INSULIN 17.3 09/24/2019   Lab  Results  Component Value Date   TSH 0.758 11/16/2021   Lab Results  Component Value Date   CHOL 142 07/29/2021   HDL 33 (L) 07/29/2021   LDLCALC 88 07/29/2021   LDLDIRECT 85.4 07/28/2007   TRIG 112 07/29/2021   CHOLHDL 5 06/27/2012   Lab Results  Component Value Date   VD25OH 57.1 06/24/2021   VD25OH 41.3 10/22/2020   VD25OH 37.6 02/25/2020   Lab Results   Component Value Date   WBC 7.7 02/18/2022   HGB 15.2 02/18/2022   HCT 45.0 02/18/2022   MCV 88.8 02/18/2022   PLT 241.0 02/18/2022   No results found for: "IRON", "TIBC", "FERRITIN"  Attestation Statements:   Reviewed by clinician on day of visit: allergies, medications, problem list, medical history, surgical history, family history, social history, and previous encounter notes.  Time spent on visit including pre-visit chart review and post-visit care and charting was 30 minutes.   I, Burt Knack, am acting as transcriptionist for Reuben Likes, MD. I have reviewed the above documentation for accuracy and completeness, and I agree with the above. - Reuben Likes, MD

## 2022-08-04 ENCOUNTER — Encounter (INDEPENDENT_AMBULATORY_CARE_PROVIDER_SITE_OTHER): Payer: Self-pay | Admitting: Family Medicine

## 2022-08-04 DIAGNOSIS — H524 Presbyopia: Secondary | ICD-10-CM | POA: Diagnosis not present

## 2022-08-04 DIAGNOSIS — H5213 Myopia, bilateral: Secondary | ICD-10-CM | POA: Diagnosis not present

## 2022-08-04 DIAGNOSIS — H52223 Regular astigmatism, bilateral: Secondary | ICD-10-CM | POA: Diagnosis not present

## 2022-08-05 NOTE — Telephone Encounter (Signed)
Please advise 

## 2022-08-08 ENCOUNTER — Other Ambulatory Visit (INDEPENDENT_AMBULATORY_CARE_PROVIDER_SITE_OTHER): Payer: Self-pay | Admitting: Family Medicine

## 2022-08-08 DIAGNOSIS — R7303 Prediabetes: Secondary | ICD-10-CM

## 2022-08-08 MED ORDER — METFORMIN HCL 500 MG PO TABS
500.0000 mg | ORAL_TABLET | Freq: Every day | ORAL | 0 refills | Status: DC
  Filled 2022-08-08: qty 90, 90d supply, fill #0

## 2022-08-09 ENCOUNTER — Telehealth: Payer: Self-pay | Admitting: Family Medicine

## 2022-08-09 ENCOUNTER — Other Ambulatory Visit (HOSPITAL_COMMUNITY): Payer: Self-pay

## 2022-08-09 ENCOUNTER — Other Ambulatory Visit: Payer: Self-pay

## 2022-08-09 NOTE — Telephone Encounter (Signed)
Can you reach out to the pt to get this sched?

## 2022-08-09 NOTE — Telephone Encounter (Signed)
This pt would like to TOC to Dr Ardyth Harps. Reason not given [place] will need to schedule his apt.

## 2022-08-12 DIAGNOSIS — F329 Major depressive disorder, single episode, unspecified: Secondary | ICD-10-CM | POA: Diagnosis not present

## 2022-08-12 NOTE — Telephone Encounter (Signed)
Pt has been sch for 09-27-2022

## 2022-08-15 ENCOUNTER — Other Ambulatory Visit: Payer: Self-pay | Admitting: Family Medicine

## 2022-08-15 DIAGNOSIS — F3289 Other specified depressive episodes: Secondary | ICD-10-CM

## 2022-08-16 ENCOUNTER — Other Ambulatory Visit (HOSPITAL_COMMUNITY): Payer: Self-pay

## 2022-08-16 MED ORDER — VENLAFAXINE HCL ER 150 MG PO CP24
150.0000 mg | ORAL_CAPSULE | Freq: Every day | ORAL | 2 refills | Status: DC
  Filled 2022-08-16: qty 30, 30d supply, fill #0
  Filled 2022-09-13: qty 30, 30d supply, fill #1
  Filled 2022-10-14: qty 30, 30d supply, fill #2

## 2022-08-31 ENCOUNTER — Other Ambulatory Visit (HOSPITAL_COMMUNITY): Payer: Self-pay

## 2022-08-31 ENCOUNTER — Other Ambulatory Visit: Payer: Self-pay | Admitting: Family Medicine

## 2022-08-31 DIAGNOSIS — E291 Testicular hypofunction: Secondary | ICD-10-CM

## 2022-08-31 MED ORDER — TESTOSTERONE CYPIONATE 200 MG/ML IM SOLN
200.0000 mg | INTRAMUSCULAR | 5 refills | Status: DC
Start: 2022-08-31 — End: 2022-10-05
  Filled 2022-08-31 – 2022-09-08 (×2): qty 10, 140d supply, fill #0
  Filled 2022-09-13 – 2022-09-17 (×3): qty 6, 84d supply, fill #0
  Filled 2022-09-23: qty 10, 90d supply, fill #0
  Filled 2022-10-01: qty 6, 84d supply, fill #0

## 2022-09-01 ENCOUNTER — Other Ambulatory Visit (HOSPITAL_COMMUNITY): Payer: Self-pay

## 2022-09-08 ENCOUNTER — Other Ambulatory Visit (HOSPITAL_COMMUNITY): Payer: Self-pay

## 2022-09-08 ENCOUNTER — Other Ambulatory Visit: Payer: Self-pay

## 2022-09-08 DIAGNOSIS — F329 Major depressive disorder, single episode, unspecified: Secondary | ICD-10-CM | POA: Diagnosis not present

## 2022-09-08 MED ORDER — LORAZEPAM 2 MG PO TABS
2.0000 mg | ORAL_TABLET | Freq: Every day | ORAL | 0 refills | Status: DC
  Filled 2022-09-08: qty 1, 1d supply, fill #0

## 2022-09-13 ENCOUNTER — Other Ambulatory Visit (HOSPITAL_COMMUNITY): Payer: Self-pay

## 2022-09-13 ENCOUNTER — Ambulatory Visit (HOSPITAL_BASED_OUTPATIENT_CLINIC_OR_DEPARTMENT_OTHER): Payer: Commercial Managed Care - PPO | Admitting: Student

## 2022-09-13 VITALS — BP 134/84 | HR 90 | Ht 75.0 in | Wt 331.0 lb

## 2022-09-13 DIAGNOSIS — F3340 Major depressive disorder, recurrent, in remission, unspecified: Secondary | ICD-10-CM

## 2022-09-13 DIAGNOSIS — R5383 Other fatigue: Secondary | ICD-10-CM | POA: Diagnosis not present

## 2022-09-13 DIAGNOSIS — R41840 Attention and concentration deficit: Secondary | ICD-10-CM | POA: Diagnosis not present

## 2022-09-13 MED ORDER — VILOXAZINE HCL ER 100 MG PO CP24
200.0000 mg | ORAL_CAPSULE | Freq: Every day | ORAL | 0 refills | Status: DC
Start: 2022-09-13 — End: 2022-10-27
  Filled 2022-09-13 – 2022-09-22 (×2): qty 60, 30d supply, fill #0

## 2022-09-14 ENCOUNTER — Other Ambulatory Visit: Payer: Self-pay

## 2022-09-14 ENCOUNTER — Other Ambulatory Visit (HOSPITAL_COMMUNITY): Payer: Self-pay

## 2022-09-14 NOTE — Progress Notes (Cosign Needed Addendum)
Psychiatric Initial Adult Assessment  Patient Identification: Phillip Roach MRN:  [id number] Date of Evaluation:  09/14/2022 Referral Source: Dr Lawson Radar, PCP  Assessment:  Phillip Roach is a 42 y.o. male with a history of major depressive disorder who presents in person to [facility] for initial evaluation of inattention, concern for ADHD.  Patient reports a childhood history of ADHD with present symptoms of inattention and distractibility, possibly consistent with ADHD.  Will refer him for neuropsychological testing as well as send a test prescription for viloxazine and arrange follow-up in our clinic.   Risk Assessment: A suicide and violence risk assessment was performed as part of this evaluation. There patient is deemed to be at chronic elevated risk for self-harm/suicide given the following factors: easy access to lethal means. These risk factors are mitigated by the following factors: motivation for treatment, utilization of positive coping skills, supportive family, sense of responsibility to family and social supports, minor children living at home, presence of a significant relationship, presence of an available support system, and expresses purpose for living. The patient is deemed to be at chronic elevated risk for violence given the following factors: chronic impulsivity. These risk factors are mitigated by the following factors: no known history of violence towards others and positive social orientation. There is no acute risk for suicide or violence at this time. The patient was educated about relevant modifiable risk factors including following recommendations for treatment of psychiatric illness and abstaining from substance abuse.  While future psychiatric events cannot be accurately predicted, the patient does not currently require acute inpatient psychiatric care and does not currently meet [facility] involuntary commitment criteria.    Plan:  #  Inattention  R/o ADHD  Hx Major depressive disorder Interventions: --Refer for neuropsychological testing - Referrals placed to both [place] [facility] and [place] - Sent in prescription for viloxazine, will need to work on prior authorization - Continue Effexor 150 mg daily as well as Wellbutrin 450 mg XL as prescribed by primary physician.  Will need to adjust these medications should we get approval for viloxazine.  # Fatigue, Reported Hx of OSA Interventions: --Recommend patient follow-up with primary care physician and discuss CPAP     Patient was given contact information for behavioral health clinic and was instructed to call 911 for emergencies.    Subjective:  Chief Complaint:  Chief Complaint  Patient presents with   Establish Care    History of Present Illness:   The patient's primary complaint today is symptoms of inattention, which he believes is likely due to ADHD.  His secondary complaint is fatigue.    Regarding his primary complaint, the patient reports being diagnosed with ADHD at the age of 42 years old by [facility] a Financial risk analyst in [place].  He states that he was started on medication as he is very beneficial to him succeeding in school.  He states that he stopped taking medication once he was in college but then resumed medication approximately 3 years ago.  He is unable to give a clear timeline of starting Strattera or Zoloft but knows that he tried both of those medications within the last several years and stopped both of them due to intolerable side effects.  He states that the intolerable side effect he developed a Strattera was significant depression and fatigue.  The patient reports that currently he is having difficulty at work and has been put under a personalized improvement plan (PIP).  He says that there  is the possibility that his employment will be terminated.  He feels that some of his problems may be related to  underlying ADHD.  He reports that he has excessive talkativeness, frequently talking to staff members instead of focusing on patients.  He reports distractibility, and says that at one point he forgot to give handoff to incoming provider.  He also states that at one point he made a morbid joke after a patient died in the Cath Lab and that this was not received well.  We discussed what the patient's plan would be if he were to be terminated, the patient reports that his financial situation is not particularly secure but that he feels like they would make do until he is able to find another job.  He appears to have an optimistic outlook about the future.  The patient reports significant fatigue, primarily bothering him when he comes home and feels too tired to play with his kids.  He does state that he is snoring frequently in the night.  Surprisingly, he says that he has had a sleep study and that it was positive for sleep apnea.  We discussed that CPAP could be of huge benefit to his fatigue.  He expresses understanding and agreed to follow-up with a primary care physician.  Psychiatric review of symptoms is negative for depression.  He denies experiencing any hopelessness or suicidal thoughts.  The patient reports instances of impulsivity, in which he bought a car on a whim, on 2 separate occasions.  It does not appear that these purchases were related to a euphoric mood,  decreased need for sleep, or excessive talkativeness.  The patient denies any other symptoms of manic episodes.  Patient denies exposure to significant trauma.  Denies significant anxiety symptoms.  Denies auditory or visual hallucinations.  Denies homicidal thoughts.  Pertinent past psychiatric, family, and social history is collected as described below.  Past Psychiatric History:  Diagnoses: Major depressive disorder Medication trials: Zoloft (caused intolerable side effect of delayed ejaculation) Previous psychiatrist/therapist:  None Hospitalizations: Patient denies, none in the medical record Suicide attempts: Denies SIB: Denies Hx of violence towards others: Denies Current access to guns: Yes, patient reports access to numerous guns, states that they are locked in a safe place he has access to.  He says that ammunition is stored separately. Hx of trauma/abuse: Denies  Substance Abuse History in the last 12 months:  No.  Past Medical History:  Past Medical History:  Diagnosis Date   ADHD    ADHD (attention deficit hyperactivity disorder)    Asthma    as a child, no inhaler - exercise and seasonal allergies induced   Back pain    Depression    Fatigue    GERD (gastroesophageal reflux disease)    in past , no current problems   Heartburn    Hypertension    in past = no current problems, no meds   Joint pain    Lactose intolerance    Overweight    Pre-diabetes    Sleep apnea    does not use cpap   Vitamin D deficiency     Past Surgical History:  Procedure Laterality Date   FOREHEAD RECONSTRUCTION Right    Right Temple Shrapnel Removed   PAROTIDECTOMY Left 06/24/2020   Procedure: SUPERFICIAL PAROTIDECTOMY WITH FACIAL DISSECTION;  Surgeon: Drema Halon, MD;  Location: [facility] OR;  Service: ENT;  Laterality: Left;   PILONIDAL CYST DRAINAGE     WISDOM TOOTH EXTRACTION  Family Psychiatric History:  The patient reports that both his mother and father have depression.  He states that his maternal great grandfather committed suicide with a firearm.  He reports that both his mother and his father have issues with alcohol.  Family History:  Family History  Problem Relation Age of Onset   GER disease Mother    Depression Mother    Alcohol abuse Mother    Obesity Mother    Anxiety disorder Father    Hypertension Father    Sleep apnea Father    Obesity Father     Social History:   The patient resides in [place], living with his 2 biological parents as well as his wife and 2 boys (ages 47  and 2 years old).  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  He reports previous issues with employers, being fired on multiple occasions.  He reports drinking approximately 1 occasion per week, 2 beers at a time.  He denies use of illegal drugs or any nicotine products.  Additional Social History: updated  Allergies:   Allergies  Allergen Reactions   Shellfish Allergy Anaphylaxis    Iodine ok per pt   Metformin And Related Rash   Keflex [Cephalexin] Rash    Current Medications: Current Outpatient Medications  Medication Sig Dispense Refill   viloxazine ER (QELBREE) 100 MG 24 hr capsule Take 2 capsules (200 mg total) by mouth daily. 60 capsule 0   acetaminophen (TYLENOL) 500 MG tablet Take 1 tablet (500 mg total) by mouth every 6 (six) hours as needed. (Patient taking differently: Take 1,000 mg by mouth every 6 (six) hours as needed for moderate pain or headache.) 30 tablet 0   b complex vitamins tablet Take 2 tablets by mouth daily.     buPROPion (WELLBUTRIN XL) 150 MG 24 hr tablet Take 3 tablets (450 mg total) by mouth daily. 270 tablet 0   chlorhexidine (PERIDEX) 0.12 % solution Rinse mouth with 1/2 oz twice daily, after breakfast and before bedtime, then spit out 473 mL 0   diazepam (VALIUM) 5 MG tablet Take 1-2 tablets (5-10 mg total) by mouth 1 hour prior to dental procedure 2 tablet 0   ibuprofen (ADVIL,MOTRIN) 200 MG tablet Take 800 mg by mouth every 6 (six) hours as needed for pain.     loratadine (CLARITIN) 10 MG tablet Take 10 mg by mouth daily.     LORazepam (ATIVAN) 2 MG tablet Take 1 tablet by mouth one hour prior to appointment for dental extractions. (You must have driver to and from appointment.) 1 tablet 0   metFORMIN (GLUCOPHAGE) 500 MG tablet Take 1 tablet (500 mg total) by mouth daily with breakfast. 90 tablet 0   testosterone cypionate (DEPOTESTOSTERONE CYPIONATE) 200 MG/ML injection Inject 1  mL (200 mg total) into the muscle every 14 (fourteen) days. 10 mL 5   triamcinolone (NASACORT) 55 MCG/ACT AERO nasal inhaler Place 1 spray into the nose daily.     venlafaxine XR (EFFEXOR XR) 150 MG 24 hr capsule Take 1 capsule (150 mg total) by mouth daily with breakfast. 30 capsule 2   No current facility-administered medications for this visit.    Psychiatric Specialty Exam: Physical Exam Constitutional:      Appearance: the patient is not toxic-appearing.  Pulmonary:     Effort: Pulmonary effort is normal.  Neurological:     General: No focal deficit present.  Mental Status: the patient is alert and oriented to person, place, and time.   Review of Systems  Respiratory:  Negative for shortness of breath.   Cardiovascular:  Negative for chest pain.  Gastrointestinal:  Negative for abdominal pain, constipation, diarrhea, nausea and vomiting.  Neurological:  Negative for headaches.      BP 134/84   Pulse 90   Ht 6\' 3"  (1.905 m)   Wt (!) 331 lb (150.1 kg)   BMI 41.37 kg/m   General Appearance: Fairly Groomed  Eye Contact:  Good  Speech:  Clear and Coherent  Volume:  Normal  Mood:  Euthymic  Affect:  Congruent, somewhat anxious  Thought Process:  Coherent  Orientation:  Full (Time, Place, and Person)  Thought Content: Logical   Suicidal Thoughts:  No  Homicidal Thoughts:  No  Memory:  Immediate;   Good  Judgement:  fair  Insight:  fair  Psychomotor Activity:  Normal  Concentration:  Concentration: Good  Recall:  Good  Fund of Knowledge: Good  Language: Good  Akathisia:  No  Handed:  not assessed  AIMS (if indicated): not done  Assets:  Communication Skills Desire for Improvement Financial Resources/Insurance Housing Leisure Time Physical Health  ADL's:  Intact  Cognition: WNL  Sleep:  poor      Metabolic Disorder Labs: Lab Results  Component Value Date   HGBA1C 5.1 05/07/2021   No results found for: "PROLACTIN" Lab Results  Component Value Date    CHOL 142 07/29/2021   TRIG 112 07/29/2021   HDL 33 (L) 07/29/2021   CHOLHDL 5 06/27/2012   VLDL 22.4 06/27/2012   LDLCALC 88 07/29/2021   LDLCALC 99 10/22/2020   Lab Results  Component Value Date   TSH 0.758 11/16/2021    Therapeutic Level Labs: No results found for: "LITHIUM" No results found for: "CBMZ" No results found for: "VALPROATE"  Screenings:  GAD-7    Flowsheet Row Office Visit from 02/18/2022 in [facility] [employer] [facility] Visit from 12/22/2021 in [facility] [employer] [facility] Visit from 05/07/2021 in [facility] [employer] Visit from 01/20/2021 in [facility] [employer] [facility] Visit from 08/14/2019 in [facility] [employer]  Total GAD-7 Score 3 3 2 1 1       PHQ2-9    Flowsheet Row Office Visit from 04/15/2022 in [facility] Visit from 02/18/2022 in [facility] [employer] Visit from 12/22/2021 in [facility] [employer] Visit from 05/07/2021 in [facility] [employer] Visit from 01/20/2021 in [facility] [employer]  PHQ-2 Total Score 0 0 0 3 0  PHQ-9 Total Score -- 6 6 15 6       Flowsheet Row ED from 01/18/2022 in [facility] ED from 11/16/2021 in [facility] at [facility] Admission (Discharged) from 06/24/2020 in [facility]  SURGICAL  C-SSRS RISK CATEGORY No Risk No Risk No Risk       Collaboration of Care: Collaboration of Care: Other none  Patient/Guardian was advised Release of Information must be obtained prior to any record release in order to collaborate their care with an outside provider. Patient/Guardian was advised if they have  not already done so to contact the registration department to  sign all necessary forms in order for [place] to release information regarding their care.   Consent: Patient/Guardian gives verbal consent for treatment and assignment of benefits for services provided during this visit. Patient/Guardian expressed understanding and agreed to proceed.   A total of 60 minutes was spent involved in face to face clinical care, chart review, documentation.  Carlyn Reichert, MD PGY-2

## 2022-09-15 ENCOUNTER — Other Ambulatory Visit (HOSPITAL_COMMUNITY): Payer: Self-pay

## 2022-09-15 NOTE — Addendum Note (Signed)
Addended by: Everlena Cooper on: 09/15/2022 09:46 AM   Modules accepted: Level of Service

## 2022-09-16 ENCOUNTER — Other Ambulatory Visit (HOSPITAL_COMMUNITY): Payer: Self-pay

## 2022-09-16 MED ORDER — IBUPROFEN 600 MG PO TABS
600.0000 mg | ORAL_TABLET | ORAL | 0 refills | Status: DC | PRN
  Filled 2022-09-16: qty 30, 5d supply, fill #0

## 2022-09-16 MED ORDER — HYDROCODONE-ACETAMINOPHEN 5-325 MG PO TABS
1.0000 | ORAL_TABLET | ORAL | 0 refills | Status: DC | PRN
  Filled 2022-09-16: qty 15, 3d supply, fill #0

## 2022-09-17 ENCOUNTER — Other Ambulatory Visit (HOSPITAL_COMMUNITY): Payer: Self-pay

## 2022-09-20 ENCOUNTER — Other Ambulatory Visit (HOSPITAL_COMMUNITY): Payer: Self-pay

## 2022-09-20 ENCOUNTER — Other Ambulatory Visit: Payer: Self-pay

## 2022-09-22 ENCOUNTER — Other Ambulatory Visit (HOSPITAL_COMMUNITY): Payer: Self-pay

## 2022-09-22 ENCOUNTER — Other Ambulatory Visit: Payer: Self-pay

## 2022-09-23 ENCOUNTER — Other Ambulatory Visit (HOSPITAL_COMMUNITY): Payer: Self-pay

## 2022-09-23 ENCOUNTER — Telehealth: Payer: Self-pay | Admitting: Pharmacy Technician

## 2022-09-23 NOTE — Telephone Encounter (Signed)
Pharmacy Patient Advocate Encounter   Received notification from CoverMyMeds that prior authorization for Testosterone is required/requested.   Insurance verification completed.   The patient is insured through [facility] .   Per test claim: PA submitted to [facility] via CoverMyMeds Key/confirmation #/EOC  [id number]) Status is pending

## 2022-09-27 ENCOUNTER — Encounter: Payer: Commercial Managed Care - PPO | Admitting: Internal Medicine

## 2022-09-27 DIAGNOSIS — F329 Major depressive disorder, single episode, unspecified: Secondary | ICD-10-CM | POA: Diagnosis not present

## 2022-10-01 ENCOUNTER — Other Ambulatory Visit (HOSPITAL_COMMUNITY): Payer: Self-pay

## 2022-10-05 ENCOUNTER — Other Ambulatory Visit: Payer: Self-pay | Admitting: Internal Medicine

## 2022-10-05 ENCOUNTER — Other Ambulatory Visit (HOSPITAL_COMMUNITY): Payer: Self-pay

## 2022-10-05 ENCOUNTER — Ambulatory Visit: Payer: Commercial Managed Care - PPO | Admitting: Internal Medicine

## 2022-10-05 ENCOUNTER — Encounter: Payer: Self-pay | Admitting: Internal Medicine

## 2022-10-05 VITALS — HR 87 | Temp 98.1°F | Ht 74.0 in | Wt 322.7 lb

## 2022-10-05 DIAGNOSIS — E559 Vitamin D deficiency, unspecified: Secondary | ICD-10-CM

## 2022-10-05 DIAGNOSIS — R7303 Prediabetes: Secondary | ICD-10-CM

## 2022-10-05 DIAGNOSIS — R030 Elevated blood-pressure reading, without diagnosis of hypertension: Secondary | ICD-10-CM | POA: Diagnosis not present

## 2022-10-05 DIAGNOSIS — Z6841 Body Mass Index (BMI) 40.0 and over, adult: Secondary | ICD-10-CM

## 2022-10-05 DIAGNOSIS — E291 Testicular hypofunction: Secondary | ICD-10-CM | POA: Diagnosis not present

## 2022-10-05 DIAGNOSIS — F900 Attention-deficit hyperactivity disorder, predominantly inattentive type: Secondary | ICD-10-CM | POA: Diagnosis not present

## 2022-10-05 DIAGNOSIS — F3341 Major depressive disorder, recurrent, in partial remission: Secondary | ICD-10-CM | POA: Diagnosis not present

## 2022-10-05 DIAGNOSIS — I10 Essential (primary) hypertension: Secondary | ICD-10-CM | POA: Diagnosis not present

## 2022-10-05 DIAGNOSIS — G4733 Obstructive sleep apnea (adult) (pediatric): Secondary | ICD-10-CM | POA: Diagnosis not present

## 2022-10-05 DIAGNOSIS — F3289 Other specified depressive episodes: Secondary | ICD-10-CM

## 2022-10-05 MED ORDER — TESTOSTERONE CYPIONATE 200 MG/ML IM SOLN
200.0000 mg | INTRAMUSCULAR | 5 refills | Status: DC
Start: 2022-10-05 — End: 2023-01-10
  Filled 2022-10-05 – 2022-10-14 (×3): qty 6, 84d supply, fill #0
  Filled 2022-12-30: qty 6, 84d supply, fill #1
  Filled 2023-01-06: qty 2, 28d supply, fill #1

## 2022-10-05 NOTE — Assessment & Plan Note (Signed)
Refill testosterone

## 2022-10-05 NOTE — Assessment & Plan Note (Signed)
Followed by psychiatry.  Has been sent for neuropsychological testing.

## 2022-10-05 NOTE — Assessment & Plan Note (Signed)
Recheck labs next visit. 

## 2022-10-05 NOTE — Assessment & Plan Note (Signed)
Check A1c with next labs.

## 2022-10-05 NOTE — Progress Notes (Signed)
Established Patient Office Visit     CC/Reason for Visit: Establish care, discuss chronic medical conditions  HPI: Phillip Roach is a 42 y.o. male who is coming in today for the above mentioned reasons. Past Medical History is significant for: Depression and ADHD managed by psychiatry, vitamin D deficiency, impaired glucose tolerance, OSA not treated, hypertension, obesity.  He also has testosterone deficiency on testosterone supplementation.  He has been feeling excessively fatigued and drowsy.  He tells me he had a sleep study a few years ago that showed sleep apnea but never followed through with CPAP treatment.  He is overdue for labs.  Blood pressure is noted to be elevated today.   Past Medical/Surgical History: Past Medical History:  Diagnosis Date   ADHD    ADHD (attention deficit hyperactivity disorder)    Asthma    as a child, no inhaler - exercise and seasonal allergies induced   Back pain    Depression    Fatigue    GERD (gastroesophageal reflux disease)    in past , no current problems   Heartburn    Hypertension    in past = no current problems, no meds   Joint pain    Lactose intolerance    Overweight    Pre-diabetes    Sleep apnea    does not use cpap   Vitamin D deficiency     Past Surgical History:  Procedure Laterality Date   FOREHEAD RECONSTRUCTION Right    Right Temple Shrapnel Removed   PAROTIDECTOMY Left 06/24/2020   Procedure: SUPERFICIAL PAROTIDECTOMY WITH FACIAL DISSECTION;  Surgeon: Drema Halon, MD;  Location: [facility] OR;  Service: ENT;  Laterality: Left;   PILONIDAL CYST DRAINAGE     WISDOM TOOTH EXTRACTION      Social History:  reports that he has never smoked. He has never used smokeless tobacco. He reports current alcohol use. He reports that he does not use drugs.  Allergies: Allergies  Allergen Reactions   Shellfish Allergy Anaphylaxis    Iodine ok per pt   Metformin And Related Rash   Keflex [Cephalexin] Rash     Family History:  Family History  Problem Relation Age of Onset   GER disease Mother    Depression Mother    Alcohol abuse Mother    Obesity Mother    Anxiety disorder Father    Hypertension Father    Sleep apnea Father    Obesity Father      Current Outpatient Medications:    ibuprofen (ADVIL,MOTRIN) 200 MG tablet, Take 800 mg by mouth every 6 (six) hours as needed for pain., Disp: , Rfl:    loratadine (CLARITIN) 10 MG tablet, Take 10 mg by mouth daily., Disp: , Rfl:    triamcinolone (NASACORT) 55 MCG/ACT AERO nasal inhaler, Place 1 spray into the nose daily., Disp: , Rfl:    venlafaxine XR (EFFEXOR XR) 150 MG 24 hr capsule, Take 1 capsule (150 mg total) by mouth daily with breakfast., Disp: 30 capsule, Rfl: 2   viloxazine ER (QELBREE) 100 MG 24 hr capsule, Take 2 capsules (200 mg total) by mouth daily., Disp: 60 capsule, Rfl: 0   acetaminophen (TYLENOL) 500 MG tablet, Take 1 tablet (500 mg total) by mouth every 6 (six) hours as needed. (Patient taking differently: Take 1,000 mg by mouth every 6 (six) hours as needed for moderate pain or headache.), Disp: 30 tablet, Rfl: 0   b complex vitamins tablet, Take 2 tablets by  mouth daily., Disp: , Rfl:    buPROPion (WELLBUTRIN XL) 150 MG 24 hr tablet, Take 3 tablets (450 mg total) by mouth daily., Disp: 270 tablet, Rfl: 0   chlorhexidine (PERIDEX) 0.12 % solution, Rinse mouth with 1/2 oz twice daily, after breakfast and before bedtime, then spit out (Patient not taking: Reported on 10/05/2022), Disp: 473 mL, Rfl: 0   diazepam (VALIUM) 5 MG tablet, Take 1-2 tablets (5-10 mg total) by mouth 1 hour prior to dental procedure (Patient not taking: Reported on 10/05/2022), Disp: 2 tablet, Rfl: 0   HYDROcodone-acetaminophen (NORCO/VICODIN) 5-325 MG tablet, Take 1 tablet by mouth every 5 (five) hours as needed for pain (Patient not taking: Reported on 10/05/2022), Disp: 15 tablet, Rfl: 0   ibuprofen (ADVIL) 600 MG tablet, Take 1 tablet (600 mg total) by  mouth every 4-6 hours as needed for pain following dental procedure (Patient not taking: Reported on 10/05/2022), Disp: 30 tablet, Rfl: 0   LORazepam (ATIVAN) 2 MG tablet, Take 1 tablet by mouth one hour prior to appointment for dental extractions. (You must have driver to and from appointment.) (Patient not taking: Reported on 10/05/2022), Disp: 1 tablet, Rfl: 0   metFORMIN (GLUCOPHAGE) 500 MG tablet, Take 1 tablet (500 mg total) by mouth daily with breakfast. (Patient not taking: Reported on 10/05/2022), Disp: 90 tablet, Rfl: 0   testosterone cypionate (DEPOTESTOSTERONE CYPIONATE) 200 MG/ML injection, Inject 1 mL (200 mg total) into the muscle every 14 (fourteen) days., Disp: 10 mL, Rfl: 5  Review of Systems:  Negative unless indicated in HPI.   Physical Exam: Vitals:   10/05/22 1434  Pulse: 87  Temp: 98.1 F (36.7 C)  TempSrc: Oral  SpO2: 97%  Weight: (!) 322 lb 11.2 oz (146.4 kg)  Height: 6\' 2"  (1.88 m)    Body mass index is 41.43 kg/m.   Physical Exam Vitals reviewed.  Constitutional:      Appearance: Normal appearance.  HENT:     Head: Normocephalic and atraumatic.  Eyes:     Conjunctiva/sclera: Conjunctivae normal.     Pupils: Pupils are equal, round, and reactive to light.  Cardiovascular:     Rate and Rhythm: Normal rate and regular rhythm.  Pulmonary:     Effort: Pulmonary effort is normal.     Breath sounds: Normal breath sounds.  Skin:    General: Skin is warm and dry.  Neurological:     General: No focal deficit present.     Mental Status: He is alert and oriented to person, place, and time.  Psychiatric:        Mood and Affect: Mood normal.        Behavior: Behavior normal.        Thought Content: Thought content normal.        Judgment: Judgment normal.      Impression and Plan:  Attention deficit hyperactivity disorder (ADHD), predominantly inattentive type Assessment & Plan: Followed by psychiatry.  Has been sent for neuropsychological  testing.   Vitamin D deficiency Assessment & Plan: Recheck labs next visit.   BMI 40.0-44.9, adult (HCC) Current BMI 40.0 Assessment & Plan: -Discussed healthy lifestyle, including increased physical activity and better food choices to promote weight loss.    Recurrent major depressive disorder, in partial remission [facility]) Assessment & Plan: Followed by psychiatry.   Primary hypertension  Androgen deficiency Assessment & Plan: Refill testosterone.  Orders: -     Testosterone Cypionate; Inject 1 mL (200 mg total) into the muscle every  14 (fourteen) days.  Dispense: 10 mL; Refill: 5  Obstructive sleep apnea syndrome Assessment & Plan: He will call previous facility that did sleep study, if they are not willing to prescribe CPAP based on results we can initiate new referral.   Other depression  Prediabetes Assessment & Plan: Check A1c with next labs.   Elevated BP without diagnosis of hypertension     Time spent:34 minutes reviewing chart, interviewing and examining patient and formulating plan of care.     Chaya Jan, MD [facility]

## 2022-10-05 NOTE — Assessment & Plan Note (Signed)
Followed by psychiatry 

## 2022-10-05 NOTE — Telephone Encounter (Signed)
Okay to refer? 

## 2022-10-05 NOTE — Assessment & Plan Note (Signed)
He will call previous facility that did sleep study, if they are not willing to prescribe CPAP based on results we can initiate new referral.

## 2022-10-05 NOTE — Telephone Encounter (Signed)
Pharmacy Patient Advocate Encounter  Received notification from [facility] that Prior Authorization for Testosterone Cyp 200 MG/ML has been DENIED. Please advise how you'd like to proceed. Full denial letter will be uploaded to the media tab. See denial reason below.  PA #/Case ID/Reference #: [id number]

## 2022-10-05 NOTE — Assessment & Plan Note (Signed)
Discussed healthy lifestyle, including increased physical activity and better food choices to promote weight loss.  

## 2022-10-11 ENCOUNTER — Ambulatory Visit (INDEPENDENT_AMBULATORY_CARE_PROVIDER_SITE_OTHER): Payer: Commercial Managed Care - PPO | Admitting: Family Medicine

## 2022-10-13 DIAGNOSIS — F329 Major depressive disorder, single episode, unspecified: Secondary | ICD-10-CM | POA: Diagnosis not present

## 2022-10-14 ENCOUNTER — Other Ambulatory Visit (HOSPITAL_COMMUNITY): Payer: Self-pay

## 2022-10-21 ENCOUNTER — Ambulatory Visit (INDEPENDENT_AMBULATORY_CARE_PROVIDER_SITE_OTHER): Payer: Commercial Managed Care - PPO | Admitting: Family Medicine

## 2022-10-21 ENCOUNTER — Encounter (INDEPENDENT_AMBULATORY_CARE_PROVIDER_SITE_OTHER): Payer: Self-pay | Admitting: Family Medicine

## 2022-10-21 VITALS — BP 135/80 | HR 81 | Temp 98.4°F | Ht 74.0 in | Wt 328.0 lb

## 2022-10-21 DIAGNOSIS — R7303 Prediabetes: Secondary | ICD-10-CM

## 2022-10-21 DIAGNOSIS — E559 Vitamin D deficiency, unspecified: Secondary | ICD-10-CM | POA: Diagnosis not present

## 2022-10-21 DIAGNOSIS — Z6841 Body Mass Index (BMI) 40.0 and over, adult: Secondary | ICD-10-CM | POA: Diagnosis not present

## 2022-10-21 DIAGNOSIS — E349 Endocrine disorder, unspecified: Secondary | ICD-10-CM | POA: Diagnosis not present

## 2022-10-21 DIAGNOSIS — E669 Obesity, unspecified: Secondary | ICD-10-CM | POA: Diagnosis not present

## 2022-10-21 DIAGNOSIS — E291 Testicular hypofunction: Secondary | ICD-10-CM

## 2022-10-21 DIAGNOSIS — E7849 Other hyperlipidemia: Secondary | ICD-10-CM

## 2022-10-21 NOTE — Progress Notes (Signed)
Chief Complaint:   OBESITY Phillip Roach is here to discuss his progress with his obesity treatment plan along with follow-up of his obesity related diagnoses. Phillip Roach is on practicing portion control and making smarter food choices, such as increasing vegetables and decreasing simple carbohydrates and states he is following his eating plan approximately 0% of the time. Phillip Roach states he is at the gym for 45 minutes 3 times per week.  Today's visit was #: 44 Starting weight: 325 lbs Starting date: 09/24/2019 Today's weight: 328 lbs Today's date: 10/21/2022 Total lbs lost to date: 0 Total lbs lost since last in-office visit: 0  Interim History: Patient presents for follow up for the first time since mid May.  He has been working out more around 45 min 3 times a week.  He is feeling frustrated at how quickly he feels hungry.  He feels hungry and not feeling full from the food he is taking in.  For about two months, the time he was off testosterone, he had no energy to do anything.  He isn't sure if he is really ready to commit to refocusing on a meal plan.    Subjective:   1. Other hyperlipidemia Patient is not on medications.  His last LDL was elevated previously and HDL low.  2. Prediabetes Patient's last labs was 1 year ago.  He is not on medications.  He has tried metformin but could not tolerate it.  3. Vitamin D deficiency Patient is not on vitamin D, and he notes fatigue.  4. Testosterone deficiency Patient was previously on testosterone injections, and he notes fatigue.  Assessment/Plan:   1. Other hyperlipidemia We will check labs today, and we will follow-up at patient's next appointment.  - Lipid Panel With LDL/HDL Ratio  2. Prediabetes We will check labs today, and we will follow-up at patient's next appointment.  - Comprehensive metabolic panel - Hemoglobin A1c - Insulin, random  3. Vitamin D deficiency We will check labs today, and we will follow-up at patient's next  appointment.  - VITAMIN D 25 Hydroxy (Vit-D Deficiency, Fractures)  4. Testosterone deficiency We will check labs today, and we will follow-up at patient's next appointment.  - Testosterone,Free and Total  5. BMI 40.0-44.9, adult (HCC)  6. Obesity with starting BMI of 41.7 Phillip Roach is currently in the action stage of change. As such, his goal is to continue with weight loss efforts. He has agreed to practicing portion control and making smarter food choices, such as increasing vegetables and decreasing simple carbohydrates.   Patient is to journal.  Exercise goals: As is.   Behavioral modification strategies: increasing lean protein intake, meal planning and cooking strategies, keeping healthy foods in the home, and planning for success.  Phillip Roach has agreed to follow-up with our clinic in 5 weeks. He was informed of the importance of frequent follow-up visits to maximize his success with intensive lifestyle modifications for his multiple health conditions.   Phillip Roach was informed we would discuss his lab results at his next visit unless there is a critical issue that needs to be addressed sooner. Phillip Roach agreed to keep his next visit at the agreed upon time to discuss these results.  Objective:   Blood pressure 135/80, pulse 81, temperature 98.4 F (36.9 C), height 6\' 2"  (1.88 m), weight (!) 328 lb (148.8 kg), SpO2 97%. Body mass index is 42.11 kg/m.  General: Cooperative, alert, well developed, in no acute distress. HEENT: Conjunctivae and lids unremarkable. Cardiovascular: Regular rhythm.  Lungs: Normal work of breathing. Neurologic: No focal deficits.   Lab Results  Component Value Date   CREATININE 1.21 11/16/2021   BUN 11 11/16/2021   NA 136 11/16/2021   K 3.8 11/16/2021   CL 101 11/16/2021   CO2 27 11/16/2021   Lab Results  Component Value Date   ALT 23 05/15/2020   AST 14 05/15/2020   ALKPHOS 50 05/15/2020   BILITOT 0.5 05/15/2020   Lab Results  Component Value Date    HGBA1C 5.1 05/07/2021   HGBA1C 5.6 01/20/2021   HGBA1C 5.2 10/22/2020   HGBA1C 5.7 (H) 02/25/2020   HGBA1C 5.7 (H) 09/24/2019   Lab Results  Component Value Date   INSULIN 22.4 07/29/2021   INSULIN 6.5 10/22/2020   INSULIN 9.7 02/25/2020   INSULIN 17.3 09/24/2019   Lab Results  Component Value Date   TSH 0.758 11/16/2021   Lab Results  Component Value Date   CHOL 142 07/29/2021   HDL 33 (L) 07/29/2021   LDLCALC 88 07/29/2021   LDLDIRECT 85.4 07/28/2007   TRIG 112 07/29/2021   CHOLHDL 5 06/27/2012   Lab Results  Component Value Date   VD25OH 57.1 06/24/2021   VD25OH 41.3 10/22/2020   VD25OH 37.6 02/25/2020   Lab Results  Component Value Date   WBC 7.7 02/18/2022   HGB 15.2 02/18/2022   HCT 45.0 02/18/2022   MCV 88.8 02/18/2022   PLT 241.0 02/18/2022   No results found for: "IRON", "TIBC", "FERRITIN"  Attestation Statements:   Reviewed by clinician on day of visit: allergies, medications, problem list, medical history, surgical history, family history, social history, and previous encounter notes.   I, Burt Knack, am acting as transcriptionist for Reuben Likes, MD.  I have reviewed the above documentation for accuracy and completeness, and I agree with the above. - Reuben Likes, MD

## 2022-10-25 ENCOUNTER — Ambulatory Visit (HOSPITAL_BASED_OUTPATIENT_CLINIC_OR_DEPARTMENT_OTHER): Payer: Commercial Managed Care - PPO | Admitting: Student

## 2022-10-25 VITALS — BP 146/88 | HR 88 | Wt 335.0 lb

## 2022-10-25 DIAGNOSIS — F3340 Major depressive disorder, recurrent, in remission, unspecified: Secondary | ICD-10-CM | POA: Diagnosis not present

## 2022-10-25 DIAGNOSIS — R41840 Attention and concentration deficit: Secondary | ICD-10-CM

## 2022-10-25 DIAGNOSIS — F3289 Other specified depressive episodes: Secondary | ICD-10-CM

## 2022-10-25 DIAGNOSIS — R5383 Other fatigue: Secondary | ICD-10-CM

## 2022-10-26 NOTE — Progress Notes (Unsigned)
BH MD Outpatient Progress Note  10/27/2022 2:33 PM Phillip Roach  MRN:  [id number]  Assessment:  Phillip Roach presents for follow-up evaluation. Today, patient reports stable psychiatric symptomatology.  He feels that his symptoms of inattention and distractibility have not improved at work.  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  The patient reports experiencing a predominantly upbeat mood and little anxiety.  Overall he feels that he is doing fairly well.  We discussed that he can begin taking Qelbree, because it appears this medication was approved by his insurance.  The patient is also taking Effexor and Wellbutrin.  In order to prevent excess noradrenergic effects, we will taper Wellbutrin to a lower dose, as described below.  The patient reports he has been unable to get in for neuropsychological testing, and he expresses understanding that this is necessary in order for consideration for stimulant medication.  Identifying Information: Phillip Roach is a 42 y.o. y.o. male with a history of major depressive disorder, currently in remission, as well as fatigue and inattention who is an established patient with [facility] for management of inattention.   Plan:  # Inattention  R/o ADHD  Hx Major depressive disorder, currently in remission Interventions: - Continue Effexor 150 mg daily - Decrease Wellbutrin XL from 450 mg to 300 mg for 3 days, then decrease to 150 mg daily thereafter - Start Qelbree 200 mg daily after 3 days at decreased dose of Wellbutrin and then continue until next appointment - The patient was given written instructions about these medication changes and was able to recite them back verbally. -- Referrals placed for neuropsychological testing, patient is attempting to schedule appointments   # Fatigue, Reported Hx of  OSA Interventions: -- The patient was able to see his primary care physician to discuss obstructive sleep apnea, however he was unable to follow-up with [facility].  I discussed that the patient can call the practice and attempt to schedule an appointment. - Recommend continued effort to get the patient CPAP  Patient was given contact information for behavioral health clinic and was instructed to call 911 for emergencies.   Subjective:  Chief Complaint:  Chief Complaint  Patient presents with   Follow-up    Interval History:  The patient appears cheerful throughout the interview today.  He states that his mood is predominantly happy but does admit that he occasionally is "too critical of myself".  He reports resolution of his personalized improvement plan: "No one has mentioned that in over a month".  He says that he has been studying hard, trying to improve his knowledge base and performance at work.  He says that he has received positive feedback over the past month, with one doctor saying "you saved the case" a few weeks ago.  The patient reports persistent attention problems at work, despite this positive development.  He states that he still gets distracted during cases and often misses things.  The patient reports regular sleep events.  He denies experiencing hopelessness or suicidal thoughts.  Visit Diagnosis:    ICD-10-CM   1. Inattention  R41.840 viloxazine ER (QELBREE) 100 MG 24 hr capsule    2. Fatigue, unspecified type  R53.83     3. Recurrent major depressive disorder, in remission (HCC)  F33.40     4. Emotional Eating Behavior  F32.89 buPROPion (WELLBUTRIN XL) 150 MG 24  hr tablet      Past Psychiatric History:  Diagnoses: Major depressive disorder Medication trials: Zoloft (caused intolerable side effect) Previous psychiatrist/therapist: None Hospitalizations: Patient denies, none in the medical record Suicide attempts: Denies SIB: Denies Hx of  violence towards others: Denies Current access to guns: Yes, patient reports access to numerous guns, states that they are locked in a safe place he has access to.  He says that ammunition is stored separately. Hx of trauma/abuse: Denies  Past Medical History:  Past Medical History:  Diagnosis Date   ADHD    ADHD (attention deficit hyperactivity disorder)    Asthma    as a child, no inhaler - exercise and seasonal allergies induced   Back pain    Depression    Fatigue    GERD (gastroesophageal reflux disease)    in past , no current problems   Heartburn    Hypertension    in past = no current problems, no meds   Joint pain    Lactose intolerance    Overweight    Pre-diabetes    Sleep apnea    does not use cpap   Vitamin D deficiency     Past Surgical History:  Procedure Laterality Date   FOREHEAD RECONSTRUCTION Right    Right Temple Shrapnel Removed   PAROTIDECTOMY Left 06/24/2020   Procedure: SUPERFICIAL PAROTIDECTOMY WITH FACIAL DISSECTION;  Surgeon: Drema Halon, MD;  Location: [facility] OR;  Service: ENT;  Laterality: Left;   PILONIDAL CYST DRAINAGE     WISDOM TOOTH EXTRACTION      Family Psychiatric History: None pertinent  Family History:  Family History  Problem Relation Age of Onset   GER disease Mother    Depression Mother    Alcohol abuse Mother    Obesity Mother    Anxiety disorder Father    Hypertension Father    Sleep apnea Father    Obesity Father     Social History:  The patient resides in [place], living with his 2 biological parents as well as his wife and 2 boys (ages 63 and 62 years old). 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 He reports previous issues with employers, being fired on multiple occasions. He reports drinking approximately 1 occasion per week, 2 beers at a time. He denies use of illegal drugs or any nicotine products.    Allergies:  Allergies   Allergen Reactions   Shellfish Allergy Anaphylaxis    Iodine ok per pt   Metformin And Related Rash   Keflex [Cephalexin] Rash    Current Medications: Current Outpatient Medications  Medication Sig Dispense Refill   acetaminophen (TYLENOL) 500 MG tablet Take 1 tablet (500 mg total) by mouth every 6 (six) hours as needed. (Patient taking differently: Take 1,000 mg by mouth every 6 (six) hours as needed for moderate pain or headache.) 30 tablet 0   b complex vitamins tablet Take 2 tablets by mouth daily.     buPROPion (WELLBUTRIN XL) 150 MG 24 hr tablet Take 2 tablets (300 mg total) daily for 3 days. Then take 1 tablet (150 mg total) daily thereafter. 90 tablet 1   ibuprofen (ADVIL,MOTRIN) 200 MG tablet Take 800 mg by mouth every 6 (six) hours as needed for pain.     LORazepam (ATIVAN) 2 MG tablet Take 1 tablet by mouth one hour prior to appointment for dental extractions. (You must have driver to  and from appointment.) 1 tablet 0   metFORMIN (GLUCOPHAGE) 500 MG tablet Take 1 tablet (500 mg total) by mouth daily with breakfast. 90 tablet 0   testosterone cypionate (DEPOTESTOSTERONE CYPIONATE) 200 MG/ML injection Inject 1 mL (200 mg total) into the muscle every 14 (fourteen) days. 10 mL 5   triamcinolone (NASACORT) 55 MCG/ACT AERO nasal inhaler Place 1 spray into the nose daily.     venlafaxine XR (EFFEXOR XR) 150 MG 24 hr capsule Take 1 capsule (150 mg total) by mouth daily with breakfast. 30 capsule 2   viloxazine ER (QELBREE) 100 MG 24 hr capsule Take 2 capsules (200 mg total) by mouth daily. 60 capsule 1   No current facility-administered medications for this visit.     Objective:  Psychiatric Specialty Exam: Physical Exam Constitutional:      Appearance: the patient is not toxic-appearing.  Pulmonary:     Effort: Pulmonary effort is normal.  Neurological:     General: No focal deficit present.     Mental Status: the patient is alert and oriented to person, place, and time.    Review of Systems  Respiratory:  Negative for shortness of breath.   Cardiovascular:  Negative for chest pain.  Gastrointestinal:  Negative for abdominal pain, constipation, diarrhea, nausea and vomiting.  Neurological:  Negative for headaches.      BP (!) 146/88   Pulse 88   Wt (!) 335 lb (152 kg)   BMI 43.01 kg/m   General Appearance: Fairly Groomed  Eye Contact:  Good  Speech:  Clear and Coherent  Volume:  Normal  Mood:  Euthymic  Affect:  Congruent  Thought Process:  Coherent  Orientation:  Full (Time, Place, and Person)  Thought Content: Logical   Suicidal Thoughts:  No  Homicidal Thoughts:  No  Memory:  Immediate;   Good  Judgement:  fair  Insight:  fair  Psychomotor Activity:  Normal  Concentration:  Concentration: Good  Recall:  Good  Fund of Knowledge: Good  Language: Good  Akathisia:  No  Handed:    AIMS (if indicated): not done  Assets:  Communication Skills Desire for Improvement Financial Resources/Insurance Housing Leisure Time Physical Health  ADL's:  Intact  Cognition: WNL  Sleep:  Fair     Metabolic Disorder Labs: Lab Results  Component Value Date   HGBA1C 5.1 05/07/2021   No results found for: "PROLACTIN" Lab Results  Component Value Date   CHOL 142 07/29/2021   TRIG 112 07/29/2021   HDL 33 (L) 07/29/2021   CHOLHDL 5 06/27/2012   VLDL 22.4 06/27/2012   LDLCALC 88 07/29/2021   LDLCALC 99 10/22/2020   Lab Results  Component Value Date   TSH 0.758 11/16/2021   TSH 2.160 06/24/2021    Therapeutic Level Labs: No results found for: "LITHIUM" No results found for: "VALPROATE" No results found for: "CBMZ"  Screenings: GAD-7    Flowsheet Row Office Visit from 10/05/2022 in [facility] [place] Office Visit from 02/18/2022 in [facility] [employer] Visit from 12/22/2021 in [facility] [place] [facility] [employer] Visit from 05/07/2021 in [facility]  [place] [facility] [employer] Visit from 01/20/2021 in [facility] [place] [facility] [employer]  Total GAD-7 Score 2 3 3 2 1       PHQ2-9    Flowsheet Row Office Visit from 10/05/2022 in [facility] [facility] [place] Office Visit from 04/15/2022 in [facility] [employer] from 02/18/2022 in [facility] at [employer] Visit from 12/22/2021 in [facility] [employer] Visit from 05/07/2021 in [facility] at [employer]  PHQ-2 Total Score 0 0 0 0 3  PHQ-9 Total Score 11 -- 6 6 15       Flowsheet Row ED from 01/18/2022 in [facility] ED from 11/16/2021 in [facility] Department at [facility] Admission (Discharged) from 06/24/2020 in [facility]  SURGICAL  C-SSRS RISK CATEGORY No Risk No Risk No Risk       Collaboration of Care: none  A total of 30 minutes was spent involved in face to face clinical care, chart review, documentation.   Carlyn Reichert, MD 10/27/2022, 2:33 PM

## 2022-10-27 ENCOUNTER — Other Ambulatory Visit: Payer: Self-pay

## 2022-10-27 ENCOUNTER — Other Ambulatory Visit (HOSPITAL_COMMUNITY): Payer: Self-pay

## 2022-10-27 MED ORDER — VILOXAZINE HCL ER 100 MG PO CP24
200.0000 mg | ORAL_CAPSULE | Freq: Every day | ORAL | 1 refills | Status: DC
Start: 2022-10-27 — End: 2023-02-07
  Filled 2022-10-27 – 2022-11-12 (×2): qty 60, 30d supply, fill #0
  Filled 2022-12-07 – 2023-01-17 (×8): qty 60, 30d supply, fill #1

## 2022-10-27 MED ORDER — BUPROPION HCL ER (XL) 150 MG PO TB24
ORAL_TABLET | ORAL | 1 refills | Status: DC
Start: 2022-10-27 — End: 2023-05-10
  Filled 2022-10-27: qty 90, fill #0
  Filled 2022-11-15 – 2022-12-07 (×2): qty 90, 87d supply, fill #0
  Filled 2022-12-20: qty 30, 27d supply, fill #0
  Filled 2023-01-04 – 2023-01-17 (×2): qty 30, 30d supply, fill #1

## 2022-10-27 NOTE — Addendum Note (Signed)
Addended by: Everlena Cooper on: 10/27/2022 03:25 PM   Modules accepted: Level of Service

## 2022-11-01 DIAGNOSIS — F329 Major depressive disorder, single episode, unspecified: Secondary | ICD-10-CM | POA: Diagnosis not present

## 2022-11-09 ENCOUNTER — Other Ambulatory Visit (HOSPITAL_COMMUNITY): Payer: Self-pay

## 2022-11-12 ENCOUNTER — Other Ambulatory Visit (HOSPITAL_COMMUNITY): Payer: Self-pay

## 2022-11-15 ENCOUNTER — Other Ambulatory Visit: Payer: Self-pay | Admitting: Family Medicine

## 2022-11-15 DIAGNOSIS — F3289 Other specified depressive episodes: Secondary | ICD-10-CM

## 2022-11-16 ENCOUNTER — Other Ambulatory Visit (HOSPITAL_COMMUNITY): Payer: Self-pay

## 2022-11-17 DIAGNOSIS — F329 Major depressive disorder, single episode, unspecified: Secondary | ICD-10-CM | POA: Diagnosis not present

## 2022-11-22 ENCOUNTER — Other Ambulatory Visit (HOSPITAL_COMMUNITY): Payer: Self-pay

## 2022-11-22 ENCOUNTER — Other Ambulatory Visit: Payer: Self-pay | Admitting: Internal Medicine

## 2022-11-22 DIAGNOSIS — F3289 Other specified depressive episodes: Secondary | ICD-10-CM

## 2022-11-23 ENCOUNTER — Other Ambulatory Visit (HOSPITAL_COMMUNITY): Payer: Self-pay

## 2022-11-23 MED ORDER — VENLAFAXINE HCL ER 150 MG PO CP24
150.0000 mg | ORAL_CAPSULE | Freq: Every day | ORAL | 2 refills | Status: DC
  Filled 2022-11-23: qty 30, 30d supply, fill #0
  Filled 2022-12-20: qty 30, 30d supply, fill #1
  Filled 2023-01-04 – 2023-01-17 (×2): qty 30, 30d supply, fill #2

## 2022-11-25 ENCOUNTER — Ambulatory Visit (INDEPENDENT_AMBULATORY_CARE_PROVIDER_SITE_OTHER): Payer: Commercial Managed Care - PPO | Admitting: Family Medicine

## 2022-11-25 ENCOUNTER — Encounter (INDEPENDENT_AMBULATORY_CARE_PROVIDER_SITE_OTHER): Payer: Self-pay | Admitting: Family Medicine

## 2022-11-25 VITALS — BP 117/78 | HR 73 | Temp 97.8°F | Ht 74.0 in | Wt 325.0 lb

## 2022-11-25 DIAGNOSIS — E7849 Other hyperlipidemia: Secondary | ICD-10-CM

## 2022-11-25 DIAGNOSIS — R7303 Prediabetes: Secondary | ICD-10-CM

## 2022-11-25 DIAGNOSIS — Z6841 Body Mass Index (BMI) 40.0 and over, adult: Secondary | ICD-10-CM | POA: Diagnosis not present

## 2022-11-25 DIAGNOSIS — E291 Testicular hypofunction: Secondary | ICD-10-CM | POA: Diagnosis not present

## 2022-11-25 DIAGNOSIS — E669 Obesity, unspecified: Secondary | ICD-10-CM | POA: Diagnosis not present

## 2022-11-25 DIAGNOSIS — E66813 Obesity, class 3: Secondary | ICD-10-CM

## 2022-11-25 DIAGNOSIS — E559 Vitamin D deficiency, unspecified: Secondary | ICD-10-CM | POA: Diagnosis not present

## 2022-11-25 NOTE — Progress Notes (Signed)
Chief Complaint:   OBESITY Phillip Roach is here to discuss his progress with his obesity treatment plan along with follow-up of his obesity related diagnoses. Phillip Roach is on practicing portion control and making smarter food choices, such as increasing vegetables and decreasing simple carbohydrates and states he is following his eating plan approximately 50% of the time. Phillip Roach states he is walking 4,000 steps and at the gym for 30-60 minutes 2-4 times per week.  Today's visit was #: 45 Starting weight: 325 lbs Starting date: 09/24/2019 Today's weight: 325 lbs Today's date: 11/25/2022 Total lbs lost to date: 0 Total lbs lost since last in-office visit: 3  Interim History: Patient voices that he has been trying to stay consistent for the last moth.  He did run out of Effexor over this past weekend and he went off cold [place] and really struggled with withdrawal symptoms.  He is starting to feel better since getting on his medications. He hasn't been the most active the last two weeks but was more active the three weeks prior.  Focusing on protein first and the order in which he eats.  Has been trying to stay away from macaroni and cheese and garlic mashed potatoes.   Subjective:   1. Prediabetes Patient is allergic to metformin. His last A1c was better controlled.   2. Other hyperlipidemia Patient is not on medications. He has been working on more mindful eating.   Assessment/Plan:   1. Prediabetes Patient will continue portion control and smarter food choices. We will check CMP, A1c, and insulin today.   2. Other hyperlipidemia We will check FLP today, and we will follow-up at patient's next appointment.   3. BMI 40.0-44.9, adult (HCC)  4. Obesity with starting BMI of 41.7 Phillip Roach is currently in the action stage of change. As such, his goal is to continue with weight loss efforts. He has agreed to practicing portion control and making smarter food choices, such as increasing vegetables  and decreasing simple carbohydrates.   Exercise goals: All adults should avoid inactivity. Some physical activity is better than none, and adults who participate in any amount of physical activity gain some health benefits.  Behavioral modification strategies: increasing lean protein intake, meal planning and cooking strategies, keeping healthy foods in the home, and planning for success.  Phillip Roach has agreed to follow-up with our clinic in 4 weeks. He was informed of the importance of frequent follow-up visits to maximize his success with intensive lifestyle modifications for his multiple health conditions.   Objective:   Blood pressure 117/78, pulse 73, temperature 97.8 F (36.6 C), height 6\' 2"  (1.88 m), weight (!) 325 lb (147.4 kg), SpO2 97%. Body mass index is 41.73 kg/m.  General: Cooperative, alert, well developed, in no acute distress. HEENT: Conjunctivae and lids unremarkable. Cardiovascular: Regular rhythm.  Lungs: Normal work of breathing. Neurologic: No focal deficits.   Lab Results  Component Value Date   CREATININE 1.03 11/25/2022   BUN 16 11/25/2022   NA 139 11/25/2022   K 4.4 11/25/2022   CL 102 11/25/2022   CO2 17 (L) 11/25/2022   Lab Results  Component Value Date   ALT 33 11/25/2022   AST 26 11/25/2022   ALKPHOS 60 11/25/2022   BILITOT 0.2 11/25/2022   Lab Results  Component Value Date   HGBA1C 5.6 11/25/2022   HGBA1C 5.1 05/07/2021   HGBA1C 5.6 01/20/2021   HGBA1C 5.2 10/22/2020   HGBA1C 5.7 (H) 02/25/2020   Lab Results  Component  Value Date   INSULIN 14.7 11/25/2022   INSULIN 22.4 07/29/2021   INSULIN 6.5 10/22/2020   INSULIN 9.7 02/25/2020   INSULIN 17.3 09/24/2019   Lab Results  Component Value Date   TSH 0.758 11/16/2021   Lab Results  Component Value Date   CHOL 147 11/25/2022   HDL 38 (L) 11/25/2022   LDLCALC 82 11/25/2022   LDLDIRECT 85.4 07/28/2007   TRIG 153 (H) 11/25/2022   CHOLHDL 5 06/27/2012   Lab Results  Component Value  Date   VD25OH 32.7 11/25/2022   VD25OH 57.1 06/24/2021   VD25OH 41.3 10/22/2020   Lab Results  Component Value Date   WBC 7.7 02/18/2022   HGB 15.2 02/18/2022   HCT 45.0 02/18/2022   MCV 88.8 02/18/2022   PLT 241.0 02/18/2022   No results found for: "IRON", "TIBC", "FERRITIN"  Attestation Statements:   Reviewed by clinician on day of visit: allergies, medications, problem list, medical history, surgical history, family history, social history, and previous encounter notes.  Time spent on visit including pre-visit chart review and post-visit care and charting was 30 minutes.   I, Burt Knack, am acting as transcriptionist for Reuben Likes, MD.  I have reviewed the above documentation for accuracy and completeness, and I agree with the above. - Reuben Likes, MD

## 2022-11-29 LAB — VITAMIN D 25 HYDROXY (VIT D DEFICIENCY, FRACTURES): Vit D, 25-Hydroxy: 32.7 ng/mL (ref 30.0–100.0)

## 2022-11-29 LAB — COMPREHENSIVE METABOLIC PANEL
ALT: 33 IU/L (ref 0–44)
AST: 26 IU/L (ref 0–40)
Albumin: 5 g/dL (ref 4.1–5.1)
Alkaline Phosphatase: 60 IU/L (ref 44–121)
BUN/Creatinine Ratio: 16 (ref 9–20)
BUN: 16 mg/dL (ref 6–24)
Bilirubin Total: 0.2 mg/dL (ref 0.0–1.2)
CO2: 17 mmol/L — ABNORMAL LOW (ref 20–29)
Calcium: 9.6 mg/dL (ref 8.7–10.2)
Chloride: 102 mmol/L (ref 96–106)
Creatinine, Ser: 1.03 mg/dL (ref 0.76–1.27)
Globulin, Total: 2.3 g/dL (ref 1.5–4.5)
Glucose: 80 mg/dL (ref 70–99)
Potassium: 4.4 mmol/L (ref 3.5–5.2)
Sodium: 139 mmol/L (ref 134–144)
Total Protein: 7.3 g/dL (ref 6.0–8.5)
eGFR: 94 mL/min/{1.73_m2} (ref >59)

## 2022-11-29 LAB — LIPID PANEL WITH LDL/HDL RATIO
Cholesterol, Total: 147 mg/dL (ref 100–199)
HDL: 38 mg/dL — ABNORMAL LOW (ref >39)
LDL Chol Calc (NIH): 82 mg/dL (ref 0–99)
LDL/HDL Ratio: 2.2 ratio (ref 0.0–3.6)
Triglycerides: 153 mg/dL — ABNORMAL HIGH (ref 0–149)
VLDL Cholesterol Cal: 27 mg/dL (ref 5–40)

## 2022-11-29 LAB — HEMOGLOBIN A1C
Est. average glucose Bld gHb Est-mCnc: 114 mg/dL
Hgb A1c MFr Bld: 5.6 % (ref 4.8–5.6)

## 2022-11-29 LAB — TESTOSTERONE,FREE AND TOTAL
Testosterone, Free: 3.4 pg/mL — ABNORMAL LOW (ref 6.8–21.5)
Testosterone: 126 ng/dL — ABNORMAL LOW (ref 264–916)

## 2022-11-29 LAB — INSULIN, RANDOM: INSULIN: 14.7 u[IU]/mL (ref 2.6–24.9)

## 2022-12-06 DIAGNOSIS — F329 Major depressive disorder, single episode, unspecified: Secondary | ICD-10-CM | POA: Diagnosis not present

## 2022-12-07 ENCOUNTER — Other Ambulatory Visit (HOSPITAL_COMMUNITY): Payer: Self-pay

## 2022-12-07 ENCOUNTER — Encounter (HOSPITAL_COMMUNITY): Payer: Self-pay

## 2022-12-20 ENCOUNTER — Other Ambulatory Visit (HOSPITAL_COMMUNITY): Payer: Self-pay

## 2022-12-20 ENCOUNTER — Other Ambulatory Visit: Payer: Self-pay

## 2022-12-22 ENCOUNTER — Ambulatory Visit (HOSPITAL_COMMUNITY): Payer: Commercial Managed Care - PPO | Admitting: Student

## 2022-12-22 ENCOUNTER — Ambulatory Visit (INDEPENDENT_AMBULATORY_CARE_PROVIDER_SITE_OTHER): Payer: Commercial Managed Care - PPO | Admitting: Family Medicine

## 2022-12-23 ENCOUNTER — Other Ambulatory Visit (HOSPITAL_COMMUNITY): Payer: Self-pay

## 2022-12-23 ENCOUNTER — Other Ambulatory Visit: Payer: Self-pay

## 2022-12-30 ENCOUNTER — Other Ambulatory Visit (HOSPITAL_COMMUNITY): Payer: Self-pay

## 2023-01-04 ENCOUNTER — Other Ambulatory Visit (INDEPENDENT_AMBULATORY_CARE_PROVIDER_SITE_OTHER): Payer: Self-pay | Admitting: Family Medicine

## 2023-01-04 ENCOUNTER — Other Ambulatory Visit (HOSPITAL_COMMUNITY): Payer: Self-pay

## 2023-01-04 DIAGNOSIS — R7303 Prediabetes: Secondary | ICD-10-CM

## 2023-01-05 ENCOUNTER — Other Ambulatory Visit (HOSPITAL_COMMUNITY): Payer: Self-pay

## 2023-01-05 ENCOUNTER — Encounter (INDEPENDENT_AMBULATORY_CARE_PROVIDER_SITE_OTHER): Payer: Self-pay | Admitting: Family Medicine

## 2023-01-05 ENCOUNTER — Ambulatory Visit (INDEPENDENT_AMBULATORY_CARE_PROVIDER_SITE_OTHER): Payer: PRIVATE HEALTH INSURANCE | Admitting: Family Medicine

## 2023-01-05 ENCOUNTER — Ambulatory Visit (INDEPENDENT_AMBULATORY_CARE_PROVIDER_SITE_OTHER): Payer: Commercial Managed Care - PPO | Admitting: Family Medicine

## 2023-01-05 VITALS — BP 163/106 | HR 88 | Temp 98.2°F | Ht 74.0 in | Wt 336.0 lb

## 2023-01-05 DIAGNOSIS — Z6841 Body Mass Index (BMI) 40.0 and over, adult: Secondary | ICD-10-CM | POA: Diagnosis not present

## 2023-01-05 DIAGNOSIS — I10 Essential (primary) hypertension: Secondary | ICD-10-CM | POA: Diagnosis not present

## 2023-01-05 DIAGNOSIS — F32A Depression, unspecified: Secondary | ICD-10-CM

## 2023-01-05 DIAGNOSIS — E669 Obesity, unspecified: Secondary | ICD-10-CM

## 2023-01-05 MED ORDER — WEGOVY 0.25 MG/0.5ML ~~LOC~~ SOAJ
0.2500 mg | SUBCUTANEOUS | 0 refills | Status: DC
Start: 2023-01-05 — End: 2023-02-07
  Filled 2023-01-05 – 2023-01-17 (×3): qty 2, 28d supply, fill #0

## 2023-01-05 NOTE — Progress Notes (Signed)
.smr  Office: [phone]  /  Fax: [phone]  WEIGHT SUMMARY AND BIOMETRICS  Anthropometric Measurements Height: 6\' 2"  (1.88 m) Weight: (!) 336 lb (152.4 kg) BMI (Calculated): 43.12 Weight at Last Visit: 328 lb Weight Lost Since Last Visit: 0 Weight Gained Since Last Visit: 8 Starting Weight: 325 lb Total Weight Loss (lbs): 0 lb (0 kg)   Body Composition  Body Fat %: 37.3 % Fat Mass (lbs): 125.4 lbs Muscle Mass (lbs): 200.8 lbs Total Body Water (lbs): 156.2 lbs Visceral Fat Rating : 22   Other Clinical Data Fasting: no Labs: no Today's Visit #: 61 Starting Date: 09/24/19    Chief Complaint: OBESITY    History of Present Illness   The patient, with a history of hypertension and obesity, presents with concerns about recent weight gain and irregular eating habits. He reports a weight gain of seven pounds since the last visit and admits to inconsistent meal patterns and food choices. The patient's breakfast varies from cereal to protein shakes to eggs on toast, and sometimes consists of finishing his children's uneaten waffles. Lunch is either skipped or is excessively large, with the patient noting a tendency to overeat when he does have lunch. Dinner is similarly irregular, and the patient reports eating heavily if lunch was skipped.  The patient also reports dehydration due to physical labor and not drinking enough fluids during work. He mentions a tendency to resort to caffeine when feeling tired after work. The patient's eating habits are further complicated by recent job loss, leading to high stress levels and irregular meal times.  The patient has been on metformin, Effexor, bupropion, and Qelbree, the latter of which he is considering discontinuing due to side effects similar to sertraline. He also reports having been on Wegovy, which he found helpful for appetite control and maintaining energy levels throughout the day, but had to discontinue due to insurance  issues.  The patient has a history of hypertension, and recent blood pressure readings have been unusually high, which he attributes to stress. He also has a history of testosterone replacement therapy, which he is due to discuss with his primary care provider.  The patient's overall health management is complicated by financial constraints and the lack of a consistent routine due to job loss. He expresses a desire to improve his eating habits and manage his health conditions more effectively.          PHYSICAL EXAM:  Blood pressure (!) 163/106, pulse 88, temperature 98.2 F (36.8 C), height 6\' 2"  (1.88 m), weight (!) 336 lb (152.4 kg), SpO2 96%. Body mass index is 43.14 kg/m.  DIAGNOSTIC DATA REVIEWED:  BMET    Component Value Date/Time   NA 139 11/25/2022 1451   K 4.4 11/25/2022 1451   CL 102 11/25/2022 1451   CO2 17 (L) 11/25/2022 1451   GLUCOSE 80 11/25/2022 1451   GLUCOSE 89 11/16/2021 1746   BUN 16 11/25/2022 1451   CREATININE 1.03 11/25/2022 1451   CALCIUM 9.6 11/25/2022 1451   GFRNONAA >60 11/16/2021 1746   GFRAA >60 07/16/2019 1958   Lab Results  Component Value Date   HGBA1C 5.6 11/25/2022   HGBA1C 5.7 (H) 09/24/2019   Lab Results  Component Value Date   INSULIN 14.7 11/25/2022   INSULIN 17.3 09/24/2019   Lab Results  Component Value Date   TSH 0.758 11/16/2021   CBC    Component Value Date/Time   WBC 7.7 02/18/2022 1108   RBC 5.07 02/18/2022 1108  HGB 15.2 02/18/2022 1108   HGB 15.6 10/22/2020 1138   HCT 45.0 02/18/2022 1108   HCT 46.9 10/22/2020 1138   PLT 241.0 02/18/2022 1108   PLT 263 10/22/2020 1138   MCV 88.8 02/18/2022 1108   MCV 86 10/22/2020 1138   MCH 29.4 11/16/2021 1746   MCHC 33.8 02/18/2022 1108   RDW 13.7 02/18/2022 1108   RDW 12.5 10/22/2020 1138   Iron Studies No results found for: "IRON", "TIBC", "FERRITIN", "IRONPCTSAT" Lipid Panel     Component Value Date/Time   CHOL 147 11/25/2022 1451   TRIG 153 (H) 11/25/2022  1451   HDL 38 (L) 11/25/2022 1451   CHOLHDL 5 06/27/2012 0830   VLDL 22.4 06/27/2012 0830   LDLCALC 82 11/25/2022 1451   LDLDIRECT 85.4 07/28/2007 1500   Hepatic Function Panel     Component Value Date/Time   PROT 7.3 11/25/2022 1451   ALBUMIN 5.0 11/25/2022 1451   AST 26 11/25/2022 1451   ALT 33 11/25/2022 1451   ALKPHOS 60 11/25/2022 1451   BILITOT 0.2 11/25/2022 1451   BILIDIR 0.0 06/27/2012 0830      Component Value Date/Time   TSH 0.758 11/16/2021 1746   TSH 2.160 06/24/2021 1123   Nutritional Lab Results  Component Value Date   VD25OH 32.7 11/25/2022   VD25OH 57.1 06/24/2021   VD25OH 41.3 10/22/2020     Assessment and Plan    Obesity Weight gain of 7 pounds since last visit. Irregular meal patterns and high stress levels contributing to poor dietary choices. Previous success with Wegovy noted. -Resume [id number], starting at the initial dose. -Encourage consistent meal planning and preparation. -Consider protein-rich foods to promote satiety. -Continue journaling dietary intake.  Hypertension Elevated blood pressure noted during visit, possibly due to high stress levels and high sodium intake from fast food. Patient to discuss with primary care provider (Dr. Ardyth Harps) next week. -Monitor blood pressure regularly. -Consider potential need for antihypertensive medication, such as Lisinopril, if elevated blood pressures persist.  Depression/ADHD Currently on Qelbree, but experiencing undesirable side effects similar to Sertraline. Considering a switch to a stimulant-based medication. -Discuss medication changes with Dr. Ardyth Harps during next week's visit.  Follow-up 3-4 weeks          He was informed of the importance of frequent follow up visits to maximize his success with intensive lifestyle modifications for his multiple health conditions.    Quillian Quince, MD

## 2023-01-06 ENCOUNTER — Other Ambulatory Visit (HOSPITAL_COMMUNITY): Payer: Self-pay

## 2023-01-09 ENCOUNTER — Other Ambulatory Visit (HOSPITAL_COMMUNITY): Payer: Self-pay

## 2023-01-10 ENCOUNTER — Other Ambulatory Visit: Payer: Self-pay

## 2023-01-10 ENCOUNTER — Encounter: Payer: Self-pay | Admitting: Internal Medicine

## 2023-01-10 ENCOUNTER — Ambulatory Visit (INDEPENDENT_AMBULATORY_CARE_PROVIDER_SITE_OTHER): Payer: PRIVATE HEALTH INSURANCE | Admitting: Internal Medicine

## 2023-01-10 ENCOUNTER — Other Ambulatory Visit (HOSPITAL_COMMUNITY): Payer: Self-pay

## 2023-01-10 VITALS — BP 149/98 | HR 76 | Temp 97.7°F | Ht 73.5 in | Wt 328.9 lb

## 2023-01-10 DIAGNOSIS — R7303 Prediabetes: Secondary | ICD-10-CM | POA: Diagnosis not present

## 2023-01-10 DIAGNOSIS — E559 Vitamin D deficiency, unspecified: Secondary | ICD-10-CM

## 2023-01-10 DIAGNOSIS — I10 Essential (primary) hypertension: Secondary | ICD-10-CM | POA: Diagnosis not present

## 2023-01-10 DIAGNOSIS — G4733 Obstructive sleep apnea (adult) (pediatric): Secondary | ICD-10-CM

## 2023-01-10 DIAGNOSIS — E291 Testicular hypofunction: Secondary | ICD-10-CM

## 2023-01-10 DIAGNOSIS — E7849 Other hyperlipidemia: Secondary | ICD-10-CM | POA: Diagnosis not present

## 2023-01-10 DIAGNOSIS — Z0000 Encounter for general adult medical examination without abnormal findings: Secondary | ICD-10-CM | POA: Diagnosis not present

## 2023-01-10 DIAGNOSIS — E349 Endocrine disorder, unspecified: Secondary | ICD-10-CM

## 2023-01-10 DIAGNOSIS — F3341 Major depressive disorder, recurrent, in partial remission: Secondary | ICD-10-CM

## 2023-01-10 MED ORDER — TESTOSTERONE CYPIONATE 200 MG/ML IM SOLN
200.0000 mg | INTRAMUSCULAR | 5 refills | Status: DC
  Filled 2023-01-10: qty 6, 84d supply, fill #0
  Filled 2023-01-17: qty 2, 28d supply, fill #0

## 2023-01-10 MED ORDER — AMLODIPINE BESYLATE 5 MG PO TABS
5.0000 mg | ORAL_TABLET | Freq: Every day | ORAL | 1 refills | Status: DC
  Filled 2023-01-10: qty 30, 30d supply, fill #0

## 2023-01-10 NOTE — Progress Notes (Signed)
Established Patient Office Visit     CC/Reason for Visit: Annual preventive exam, discuss acute concern  HPI: Phillip Roach is a 42 y.o. male who is coming in today for the above mentioned reasons. Past Medical History is significant for: Depression and ADHD currently followed by psychiatry, impaired glucose tolerance, vitamin D deficiency, morbid obesity, OSA, testosterone deficiency.  He has been diagnosed with hypertension in the past but is not currently on any medication.  He has been started on Qelbree for his ADHD but would like to transition to something different.  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  He will be getting COVID-vaccine tomorrow.  Needs refills of testosterone.  Has routine eye and dental care.   Past Medical/Surgical History: Past Medical History:  Diagnosis Date   ADHD    ADHD (attention deficit hyperactivity disorder)    Asthma    as a child, no inhaler - exercise and seasonal allergies induced   Back pain    Depression    Fatigue    GERD (gastroesophageal reflux disease)    in past , no current problems   Heartburn    Hypertension    in past = no current problems, no meds   Joint pain    Lactose intolerance    Overweight    Pre-diabetes    Sleep apnea    does not use cpap   Vitamin D deficiency     Past Surgical History:  Procedure Laterality Date   FOREHEAD RECONSTRUCTION Right    Right Temple Shrapnel Removed   PAROTIDECTOMY Left 06/24/2020   Procedure: SUPERFICIAL PAROTIDECTOMY WITH FACIAL DISSECTION;  Surgeon: Drema Halon, MD;  Location: [facility] OR;  Service: ENT;  Laterality: Left;   PILONIDAL CYST DRAINAGE     WISDOM TOOTH EXTRACTION      Social History:  reports that he has never smoked. He has never used smokeless tobacco. He reports current alcohol use. He reports that he does not use drugs.  Allergies: Allergies  Allergen Reactions   Shellfish Allergy Anaphylaxis    Iodine  ok per pt   Metformin And Related Rash   Keflex [Cephalexin] Rash    Family History:  Family History  Problem Relation Age of Onset   GER disease Mother    Depression Mother    Alcohol abuse Mother    Obesity Mother    Anxiety disorder Father    Hypertension Father    Sleep apnea Father    Obesity Father      Current Outpatient Medications:    acetaminophen (TYLENOL) 500 MG tablet, Take 1 tablet (500 mg total) by mouth every 6 (six) hours as needed. (Patient taking differently: Take 1,000 mg by mouth every 6 (six) hours as needed for moderate pain (pain score 4-6) or headache.), Disp: 30 tablet, Rfl: 0   amLODipine (NORVASC) 5 MG tablet, Take 1 tablet (5 mg total) by mouth daily., Disp: 90 tablet, Rfl: 1   b complex vitamins tablet, Take 2 tablets by mouth daily., Disp: , Rfl:    buPROPion (WELLBUTRIN XL) 150 MG 24 hr tablet, Take 2 tablets (300 mg total) daily for 3 days, THEN 1 tablet (150 mg total) daily thereafter., Disp: 90 tablet, Rfl: 1   ibuprofen (ADVIL,MOTRIN) 200 MG tablet, Take 800 mg by mouth every 6 (six) hours as needed for pain., Disp: , Rfl:    Semaglutide-Weight Management (WEGOVY) 0.25 MG/0.5ML SOAJ, Inject 0.25 mg  into the skin once a week., Disp: 2 mL, Rfl: 0   triamcinolone (NASACORT) 55 MCG/ACT AERO nasal inhaler, Place 1 spray into the nose daily., Disp: , Rfl:    venlafaxine XR (EFFEXOR XR) 150 MG 24 hr capsule, Take 1 capsule (150 mg total) by mouth daily with breakfast., Disp: 30 capsule, Rfl: 2   viloxazine ER (QELBREE) 100 MG 24 hr capsule, Take 2 capsules (200 mg total) by mouth daily., Disp: 60 capsule, Rfl: 1   testosterone cypionate (DEPOTESTOSTERONE CYPIONATE) 200 MG/ML injection, Inject 1 mL (200 mg total) into the muscle every 14 (fourteen) days., Disp: 10 mL, Rfl: 5  Review of Systems:  Negative unless indicated in HPI.   Physical Exam: Vitals:   01/10/23 0928 01/10/23 0934  BP: (!) 140/98 (!) 149/98  Pulse: 76   Temp: 97.7 F (36.5 C)    TempSrc: Oral   SpO2: 96%   Weight: (!) 328 lb 14.4 oz (149.2 kg)   Height: 6' 1.5" (1.867 m)     Body mass index is 42.8 kg/m.   Physical Exam Vitals reviewed.  Constitutional:      General: He is not in acute distress.    Appearance: Normal appearance. He is not ill-appearing, toxic-appearing or diaphoretic.  HENT:     Head: Normocephalic.     Right Ear: Tympanic membrane, ear canal and external ear normal. There is no impacted cerumen.     Left Ear: Tympanic membrane, ear canal and external ear normal. There is no impacted cerumen.     Nose: Nose normal.     Mouth/Throat:     Mouth: Mucous membranes are moist.     Pharynx: Oropharynx is clear. No oropharyngeal exudate or posterior oropharyngeal erythema.  Eyes:     General: No scleral icterus.       Right eye: No discharge.        Left eye: No discharge.     Conjunctiva/sclera: Conjunctivae normal.     Pupils: Pupils are equal, round, and reactive to light.  Neck:     Vascular: No carotid bruit.  Cardiovascular:     Rate and Rhythm: Normal rate and regular rhythm.     Pulses: Normal pulses.     Heart sounds: Normal heart sounds.  Pulmonary:     Effort: Pulmonary effort is normal. No respiratory distress.     Breath sounds: Normal breath sounds.  Abdominal:     General: Abdomen is flat. Bowel sounds are normal.     Palpations: Abdomen is soft.  Musculoskeletal:        General: Normal range of motion.     Cervical back: Normal range of motion.  Skin:    General: Skin is warm and dry.  Neurological:     General: No focal deficit present.     Mental Status: He is alert and oriented to person, place, and time. Mental status is at baseline.  Psychiatric:        Mood and Affect: Mood normal.        Behavior: Behavior normal.        Thought Content: Thought content normal.        Judgment: Judgment normal.     Flowsheet Row Office Visit from 01/10/2023 in [facility] [place]  PHQ-9 Total  Score 4        Impression and Plan:  Encounter for preventive health examination  Primary hypertension -     amLODIPine Besylate; Take 1 tablet (5 mg  total) by mouth daily.  Dispense: 90 tablet; Refill: 1  Prediabetes  Other hyperlipidemia  Vitamin D deficiency  Testosterone deficiency  Androgen deficiency -     Testosterone Cypionate; Inject 1 mL (200 mg total) into the muscle every 14 (fourteen) days.  Dispense: 10 mL; Refill: 5  Obstructive sleep apnea syndrome  Recurrent major depressive disorder, in partial remission (HCC)  -Recommend routine eye and dental care. -Healthy lifestyle discussed in detail. -Labs to be updated today.  Health Maintenance  Topic Date Due   HIV Screening  Never done   Hepatitis C Screening  Never done   Flu Shot  06/06/2023*   DTaP/Tdap/Td vaccine (5 - Td or Tdap) 06/06/2024   HPV Vaccine  Aged Out   COVID-19 Vaccine  Discontinued  *Topic was postponed. The date shown is not the original due date.     -Will be getting flu vaccine at work tomorrow, will consider updating COVID, other vaccines are up-to-date. -Recommend screening colonoscopy age 71. -Blood pressure elevated today.  He has had a history of hypertension in the past.  Start amlodipine 5 mg daily, return in 3 months for follow-up.  He will do ambulatory blood pressure measurements. -Have advised that he continue follow-up for now with psychiatry in regards to his ADHD management.  He will let us know if he is interested in pursuing consultation with a different psychiatrist.    Chaya Jan, MD [facility]

## 2023-01-11 ENCOUNTER — Other Ambulatory Visit (HOSPITAL_COMMUNITY): Payer: Self-pay

## 2023-01-17 ENCOUNTER — Other Ambulatory Visit (HOSPITAL_COMMUNITY): Payer: Self-pay

## 2023-01-20 ENCOUNTER — Telehealth (INDEPENDENT_AMBULATORY_CARE_PROVIDER_SITE_OTHER): Payer: Self-pay

## 2023-01-20 NOTE — Telephone Encounter (Signed)
Prior auth submitted for [employer].  Awaiting determination.

## 2023-01-21 ENCOUNTER — Telehealth: Payer: Self-pay | Admitting: Internal Medicine

## 2023-01-21 ENCOUNTER — Other Ambulatory Visit (HOSPITAL_COMMUNITY): Payer: Self-pay

## 2023-01-21 DIAGNOSIS — E291 Testicular hypofunction: Secondary | ICD-10-CM

## 2023-01-21 NOTE — Telephone Encounter (Signed)
Pt called to say he was following up on the PA for Testosterone. Pt was informed that MD & CMA are both currently OOO today.  Pt asked that someone please call him back on Monday, to let him know what the status is.

## 2023-01-24 NOTE — Telephone Encounter (Signed)
Approved DRUG Wegovy Inj 0.25mg  NAME: Elihu Thelin PATIENT Member ID: [id number] NAME: Case number: [id number] NEXT You can now fill your prescription for this medication. STEPS: VALID 01/20/2023 - 07/20/2023

## 2023-01-25 ENCOUNTER — Other Ambulatory Visit (HOSPITAL_COMMUNITY): Payer: Self-pay

## 2023-01-25 ENCOUNTER — Telehealth: Payer: Self-pay

## 2023-01-25 NOTE — Telephone Encounter (Signed)
Pharmacy Patient Advocate Encounter   Received notification from Pt Calls Messages that prior authorization for Testosterone Cypionate 200MG /ML intramuscular solution is required/requested.   Insurance verification completed.   The patient is insured through [facility] .   Per test claim: PA required; PA submitted to above mentioned insurance via CoverMyMeds Key/confirmation #/EOC [id number] Status is pending

## 2023-01-25 NOTE — Telephone Encounter (Signed)
PA request has been Submitted. New Encounter created for follow up. For additional info see Pharmacy Prior Auth telephone encounter from 01/25/23.

## 2023-01-26 ENCOUNTER — Telehealth (HOSPITAL_COMMUNITY): Payer: Self-pay | Admitting: *Deleted

## 2023-01-26 ENCOUNTER — Other Ambulatory Visit (HOSPITAL_COMMUNITY): Payer: Self-pay

## 2023-01-26 NOTE — Telephone Encounter (Signed)
Pharmacy Patient Advocate Encounter  Received notification from [facility] that Prior Authorization for Testosterone Cypionate 200MG /ML  has been APPROVED from 01/25/23 to 01/25/24   PA #/Case ID/Reference #: [id number]

## 2023-01-26 NOTE — Telephone Encounter (Signed)
Patient is aware that  Prior Authorization for Testosterone Cypionate 200MG /ML  has been APPROVED

## 2023-01-26 NOTE — Telephone Encounter (Signed)
Requesting testosterone cypionate (DEPOTESTOSTERONE CYPIONATE) 200 MG/ML injection be redirected to  [facility] - [place], [place] - [address] Phone: [phone]  Fax: [phone]

## 2023-01-26 NOTE — Telephone Encounter (Signed)
PA for Qelbree 100 mg capsules. 2 caps every day, submitted to [employer] via CoverMyMeds portal.   Awaiting determination.

## 2023-01-27 ENCOUNTER — Other Ambulatory Visit: Payer: Self-pay | Admitting: Internal Medicine

## 2023-01-27 DIAGNOSIS — E291 Testicular hypofunction: Secondary | ICD-10-CM

## 2023-01-27 MED ORDER — TESTOSTERONE CYPIONATE 200 MG/ML IM SOLN: 200.0000 mg | INTRAMUSCULAR | 5 refills | Status: DC

## 2023-01-27 NOTE — Telephone Encounter (Signed)
This is controlled.  Can you please send in the refill?  thanks

## 2023-02-07 ENCOUNTER — Ambulatory Visit (INDEPENDENT_AMBULATORY_CARE_PROVIDER_SITE_OTHER): Payer: PRIVATE HEALTH INSURANCE | Admitting: Family Medicine

## 2023-02-07 DIAGNOSIS — E559 Vitamin D deficiency, unspecified: Secondary | ICD-10-CM

## 2023-02-07 DIAGNOSIS — E669 Obesity, unspecified: Secondary | ICD-10-CM

## 2023-02-07 DIAGNOSIS — I10 Essential (primary) hypertension: Secondary | ICD-10-CM

## 2023-02-07 DIAGNOSIS — Z6841 Body Mass Index (BMI) 40.0 and over, adult: Secondary | ICD-10-CM | POA: Diagnosis not present

## 2023-02-07 MED ORDER — WEGOVY 0.5 MG/0.5ML ~~LOC~~ SOAJ: 0.5000 mg | SUBCUTANEOUS | 0 refills | Status: DC

## 2023-02-07 NOTE — Assessment & Plan Note (Signed)
BP elevated today on initial and follow up reading. On amlodipine 5mg  daily.  BP elevated since September 2024.  No chest pain, chest pressure, headache.  No change in medication dosage or med- follow up on BP at next appointment.

## 2023-02-07 NOTE — Progress Notes (Signed)
SUBJECTIVE:  Chief Complaint: Obesity  Interim History: Patient last seen at end of October.  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  He has been under quite a bit of stress and has done some stress eating.  Has been experiencing quite a bit of stress. He is currently not being treated for ADHD and is feeling less controlled with his symptoms. He has not been able to follow any sort of meal plan as he has been focusing on surviving.   Arta is here to discuss his progress with his obesity treatment plan. He is on the keeping a food journal and adhering to recommended goals of 2400 calories and 135+ grams of protein and states he is following his eating plan approximately 50 % of the time. He states he is not exercising.  OBJECTIVE: Visit Diagnoses: Problem List Items Addressed This Visit       Cardiovascular and Mediastinum   Hypertension    BP elevated today on initial and follow up reading. On amlodipine 5mg  daily.  BP elevated since September 2024.  No chest pain, chest pressure, headache.  No change in medication dosage or med- follow up on BP at next appointment.         Other   Vitamin D deficiency    Discussed importance of vitamin d supplementation.  Vitamin d supplementation has been shown to decrease fatigue, decrease risk of progression to insulin resistance and then prediabetes, decreases risk of falling in older age and can even assist in decreasing depressive symptoms in PTSD.   Patient to work on consistently taking OTC as available.        BMI 40.0-44.9, adult (HCC) Current BMI 40.0   Relevant Medications   Semaglutide-Weight Management (WEGOVY) 0.5 MG/0.5ML SOAJ   Obesity (HCC)- Starting BMI 41.73 - Primary   Relevant Medications   Semaglutide-Weight Management (WEGOVY) 0.5 MG/0.5ML SOAJ    Vitals Temp: 98 F (36.7 C) BP: (!) 152/89 Pulse Rate: 87 SpO2: 98 %   Anthropometric Measurements Height: 6\' 2"   (1.88 m) Weight: (!) 339 lb (153.8 kg) BMI (Calculated): 43.51 Weight at Last Visit: 336 lb Weight Lost Since Last Visit: 0 Weight Gained Since Last Visit: 3 lb Starting Weight: 325 lb Total Weight Loss (lbs): 0 lb (0 kg)   Body Composition  Body Fat %: 36.6 % Fat Mass (lbs): 124 lbs Muscle Mass (lbs): 204.6 lbs Total Body Water (lbs): 153 lbs Visceral Fat Rating : 22   Other Clinical Data Today's Visit #: 55 Starting Date: 09/24/19     ASSESSMENT AND PLAN:  Diet: Recardo is currently in the action stage of change. As such, his goal is to continue with weight loss efforts. He has agreed to keeping a food journal and adhering to recommended goals of 2400 calories and 135 or more grams protein.  He is going to focus more consistently on increasing protein amount and limiting simple carbohydrates.  Exercise: Nishad has been instructed that some exercise is better than none for weight loss and overall health benefits.   Behavior Modification:  We discussed the following Behavioral Modification Strategies today: increasing lean protein intake, increasing vegetables, meal planning and cooking strategies, better snacking choices, and emotional eating strategies . We discussed various medication options to help Krishang with his weight loss efforts and we both agreed to increase wegovy to 0.5mg  weekly.  No follow-ups on file.[name] He was informed of the importance of frequent  follow up visits to maximize his success with intensive lifestyle modifications for his multiple health conditions.  Attestation Statements:   Reviewed by clinician on day of visit: allergies, medications, problem list, medical history, surgical history, family history, social history, and previous encounter notes.   Reuben Likes, MD

## 2023-02-07 NOTE — Assessment & Plan Note (Signed)
Discussed importance of vitamin d supplementation.  Vitamin d supplementation has been shown to decrease fatigue, decrease risk of progression to insulin resistance and then prediabetes, decreases risk of falling in older age and can even assist in decreasing depressive symptoms in PTSD.   Patient to work on consistently taking OTC as available.

## 2023-02-21 ENCOUNTER — Encounter (INDEPENDENT_AMBULATORY_CARE_PROVIDER_SITE_OTHER): Payer: Self-pay | Admitting: Family Medicine

## 2023-02-21 ENCOUNTER — Other Ambulatory Visit (INDEPENDENT_AMBULATORY_CARE_PROVIDER_SITE_OTHER): Payer: Self-pay | Admitting: Family Medicine

## 2023-02-21 ENCOUNTER — Other Ambulatory Visit (INDEPENDENT_AMBULATORY_CARE_PROVIDER_SITE_OTHER): Payer: Self-pay

## 2023-02-21 MED ORDER — WEGOVY 0.5 MG/0.5ML ~~LOC~~ SOAJ: 0.5000 mg | SUBCUTANEOUS | 0 refills | Status: DC

## 2023-02-22 ENCOUNTER — Other Ambulatory Visit: Payer: Self-pay | Admitting: Internal Medicine

## 2023-02-22 DIAGNOSIS — F3289 Other specified depressive episodes: Secondary | ICD-10-CM

## 2023-03-15 ENCOUNTER — Ambulatory Visit (INDEPENDENT_AMBULATORY_CARE_PROVIDER_SITE_OTHER): Payer: Managed Care, Other (non HMO) | Admitting: Family Medicine

## 2023-03-15 ENCOUNTER — Encounter (INDEPENDENT_AMBULATORY_CARE_PROVIDER_SITE_OTHER): Payer: Self-pay

## 2023-03-15 ENCOUNTER — Encounter (INDEPENDENT_AMBULATORY_CARE_PROVIDER_SITE_OTHER): Payer: Self-pay | Admitting: Family Medicine

## 2023-03-15 DIAGNOSIS — I10 Essential (primary) hypertension: Secondary | ICD-10-CM

## 2023-03-15 DIAGNOSIS — F909 Attention-deficit hyperactivity disorder, unspecified type: Secondary | ICD-10-CM | POA: Diagnosis not present

## 2023-03-15 DIAGNOSIS — Z6841 Body Mass Index (BMI) 40.0 and over, adult: Secondary | ICD-10-CM | POA: Diagnosis not present

## 2023-03-15 DIAGNOSIS — E669 Obesity, unspecified: Secondary | ICD-10-CM

## 2023-03-15 MED ORDER — WEGOVY 1 MG/0.5ML ~~LOC~~ SOAJ: 1.0000 mg | SUBCUTANEOUS | 0 refills | Status: DC

## 2023-03-15 NOTE — Progress Notes (Signed)
 SUBJECTIVE:  Chief Complaint: Obesity  Interim History: Patient presents for follow up.  He visited family. He hurt his back two days ago after splitting wood.  He has not yet established care with [facility].  He has been doing a morning protein shake and doing shopping on discount animal protein.  He is drinking quite a bit of water daily.  He is enjoying his new job.  Over the next few weeks he is hoping to get away with is wife.  He is trying to get thru orientation for work.  Last night he was able to get a massage in for his back pain.  Edwin is here to discuss his progress with his obesity treatment plan. He is on the keeping a food journal and adhering to recommended goals of 2400 calories and 135 grams of protein and states he is following his eating plan approximately 75 % of the time. He states he was exercising 60 minutes 3-4 times per week.   OBJECTIVE: Visit Diagnoses: Problem List Items Addressed This Visit       Cardiovascular and Mediastinum   Hypertension   Blood pressure is elevated today.  He mentions that he is concerned that wellbutrin  and effexor  are contributing to his elevated BP. He was recently started on amlodipine  5mg .  He denies chest pain, chest pressure and headache.  His back was recently strained and he is in quite a bit of pain.        Other   ADHD (attention deficit hyperactivity disorder)   Discussed with patient evaluation option of [facility].  He has the paperwork to fill out that needs to be completed in order to get an appointment scheduled.  He is hopeful that with evaluation there he will be able to be treated for his ADHD pharmaceutically as he was previously.  Will follow up at next appointment to ensure patient has filled out paperwork.      BMI 40.0-44.9, adult (HCC) Current BMI 40.0   Relevant Medications   Semaglutide -Weight Management (WEGOVY ) 1 MG/0.5ML SOAJ   Obesity (HCC)- Starting BMI 41.73 -  Primary   Relevant Medications   Semaglutide -Weight Management (WEGOVY ) 1 MG/0.5ML SOAJ    No data recorded No data recorded No data recorded No data recorded   ASSESSMENT AND PLAN:  Diet: Elroy is currently in the action stage of change. As such, his goal is to continue with weight loss efforts. He has agreed to keeping a food journal and adhering to recommended goals of 2400 calories and 135g of protein daily.   Exercise: Brelan has been instructed that some exercise is better than none for weight loss and overall health benefits.   Behavior Modification:  We discussed the following Behavioral Modification Strategies today: increasing lean protein intake, increase H2O intake, meal planning and cooking strategies, better snacking choices, planning for success, and keep a strict food journal. We discussed various medication options to help Adeeb with his weight loss efforts and we both agreed to continue Wegovy  at same dose.  He feels as though this dose may be efficient in controlling his cravings and hunger and mentions sometimes he is not eating (recently due to pain).  No follow-ups on file.SABRA He was informed of the importance of frequent follow up visits to maximize his success with intensive lifestyle modifications for his multiple health conditions.  Attestation Statements:   Reviewed by clinician on day of visit: allergies, medications, problem list, medical history, surgical history, family history,  social history, and previous encounter notes.   Time spent on visit including pre-visit chart review and post-visit care and charting was 30 minutes.    Adelita Cho, MD

## 2023-03-24 NOTE — Assessment & Plan Note (Signed)
Blood pressure is elevated today.  He mentions that he is concerned that wellbutrin and effexor are contributing to his elevated BP. He was recently started on amlodipine 5mg .  He denies chest pain, chest pressure and headache.  His back was recently strained and he is in quite a bit of pain.

## 2023-03-24 NOTE — Assessment & Plan Note (Signed)
Discussed with patient evaluation option of [facility].  He has the paperwork to fill out that needs to be completed in order to get an appointment scheduled.  He is hopeful that with evaluation there he will be able to be treated for his ADHD pharmaceutically as he was previously.  Will follow up at next appointment to ensure patient has filled out paperwork.

## 2023-04-12 ENCOUNTER — Encounter: Payer: Self-pay | Admitting: Internal Medicine

## 2023-04-12 ENCOUNTER — Ambulatory Visit: Payer: Managed Care, Other (non HMO) | Admitting: Internal Medicine

## 2023-04-12 VITALS — BP 134/88 | HR 85 | Temp 97.6°F | Wt 334.9 lb

## 2023-04-12 DIAGNOSIS — I10 Essential (primary) hypertension: Secondary | ICD-10-CM | POA: Diagnosis not present

## 2023-04-12 MED ORDER — VALSARTAN-HYDROCHLOROTHIAZIDE 160-25 MG PO TABS: 1.0000 | ORAL_TABLET | Freq: Every day | ORAL | 1 refills | Status: DC

## 2023-04-12 NOTE — Progress Notes (Signed)
 Established Patient Office Visit     CC/Reason for Visit: Blood pressure follow-up  HPI: Phillip Roach is a 43 y.o. male who is coming in today for the above mentioned reasons. Past Medical History is significant for: Depression, ADHD, impaired glucose tolerance, vitamin D  deficiency, morbid obesity, OSA.  Has been feeling well.  Has been adherent with amlodipine .  Systolic blood pressures have been in the high 130s to 140s with diastolics mainly in the upper 80s to 90s.   Past Medical/Surgical History: Past Medical History:  Diagnosis Date   ADHD    ADHD (attention deficit hyperactivity disorder)    Asthma    as a child, no inhaler - exercise and seasonal allergies induced   Back pain    Depression    Fatigue    GERD (gastroesophageal reflux disease)    in past , no current problems   Heartburn    Hypertension    in past = no current problems, no meds   Joint pain    Lactose intolerance    Overweight    Pre-diabetes    Sleep apnea    does not use cpap   Vitamin D  deficiency     Past Surgical History:  Procedure Laterality Date   FOREHEAD RECONSTRUCTION Right    Right Temple Shrapnel Removed   PAROTIDECTOMY Left 06/24/2020   Procedure: SUPERFICIAL PAROTIDECTOMY WITH FACIAL DISSECTION;  Surgeon: Ethyl Lonni BRAVO, MD;  Location: [facility] OR;  Service: ENT;  Laterality: Left;   PILONIDAL CYST DRAINAGE     WISDOM TOOTH EXTRACTION      Social History:  reports that he has never smoked. He has never used smokeless tobacco. He reports current alcohol use. He reports that he does not use drugs.  Allergies: Allergies  Allergen Reactions   Shellfish Allergy Anaphylaxis    Iodine ok per pt   Metformin  And Related Rash   Keflex [Cephalexin] Rash    Family History:  Family History  Problem Relation Age of Onset   GER disease Mother    Depression Mother    Alcohol abuse Mother    Obesity Mother    Anxiety disorder Father    Hypertension Father    Sleep  apnea Father    Obesity Father      Current Outpatient Medications:    acetaminophen  (TYLENOL ) 500 MG tablet, Take 1 tablet (500 mg total) by mouth every 6 (six) hours as needed. (Patient taking differently: Take 1,000 mg by mouth every 6 (six) hours as needed for moderate pain (pain score 4-6) or headache.), Disp: 30 tablet, Rfl: 0   amLODipine  (NORVASC ) 5 MG tablet, Take 1 tablet (5 mg total) by mouth daily., Disp: 90 tablet, Rfl: 1   b complex vitamins tablet, Take 2 tablets by mouth daily., Disp: , Rfl:    buPROPion  (WELLBUTRIN  XL) 150 MG 24 hr tablet, Take 2 tablets (300 mg total) daily for 3 days, THEN 1 tablet (150 mg total) daily thereafter., Disp: 90 tablet, Rfl: 1   ibuprofen  (ADVIL ,MOTRIN ) 200 MG tablet, Take 800 mg by mouth every 6 (six) hours as needed for pain., Disp: , Rfl:    Semaglutide -Weight Management (WEGOVY ) 1 MG/0.5ML SOAJ, Inject 1 mg into the skin once a week., Disp: 2 mL, Rfl: 0   testosterone  cypionate (DEPOTESTOSTERONE CYPIONATE) 200 MG/ML injection, Inject 1 mL (200 mg total) into the muscle every 14 (fourteen) days., Disp: 10 mL, Rfl: 5   triamcinolone  (NASACORT ) 55 MCG/ACT AERO nasal inhaler, Place  1 spray into the nose daily., Disp: , Rfl:    venlafaxine  XR (EFFEXOR -XR) 150 MG 24 hr capsule, Take 1 capsule (150 mg total) by mouth daily with breakfast., Disp: 30 capsule, Rfl: 2   valsartan -hydrochlorothiazide  (DIOVAN -HCT) 160-25 MG tablet, Take 1 tablet by mouth daily., Disp: 90 tablet, Rfl: 1  Review of Systems:  Negative unless indicated in HPI.   Physical Exam: Vitals:   04/12/23 0904  BP: 134/88  Pulse: 85  Temp: 97.6 F (36.4 C)  TempSrc: Oral  SpO2: 98%  Weight: (!) 334 lb 14.4 oz (151.9 kg)    Body mass index is 43 kg/m.   Physical Exam Vitals reviewed.  Constitutional:      Appearance: Normal appearance.  HENT:     Head: Normocephalic and atraumatic.  Eyes:     Conjunctiva/sclera: Conjunctivae normal.     Pupils: Pupils are equal,  round, and reactive to light.  Cardiovascular:     Rate and Rhythm: Normal rate and regular rhythm.  Pulmonary:     Effort: Pulmonary effort is normal.     Breath sounds: Normal breath sounds.  Skin:    General: Skin is warm and dry.  Neurological:     General: No focal deficit present.     Mental Status: He is alert and oriented to person, place, and time.  Psychiatric:        Mood and Affect: Mood normal.        Behavior: Behavior normal.        Thought Content: Thought content normal.        Judgment: Judgment normal.     Impression and Plan:  Primary hypertension -     Valsartan -hydroCHLOROthiazide ; Take 1 tablet by mouth daily.  Dispense: 90 tablet; Refill: 1   -Blood pressure is not well-controlled on amlodipine  alone.  Add valsartan  HCT, he will do ambulatory blood pressure measurements and return in 6 to 8 weeks for follow-up.  Time spent:30 minutes reviewing chart, interviewing and examining patient and formulating plan of care.     Tully Theophilus Andrews, MD [facility]

## 2023-04-13 ENCOUNTER — Encounter (INDEPENDENT_AMBULATORY_CARE_PROVIDER_SITE_OTHER): Payer: Self-pay | Admitting: Physician Assistant

## 2023-04-13 ENCOUNTER — Ambulatory Visit (INDEPENDENT_AMBULATORY_CARE_PROVIDER_SITE_OTHER): Payer: Managed Care, Other (non HMO) | Admitting: Physician Assistant

## 2023-04-13 VITALS — BP 135/89 | HR 79 | Temp 98.8°F | Ht 74.0 in | Wt 329.0 lb

## 2023-04-13 DIAGNOSIS — E559 Vitamin D deficiency, unspecified: Secondary | ICD-10-CM | POA: Diagnosis not present

## 2023-04-13 DIAGNOSIS — R7303 Prediabetes: Secondary | ICD-10-CM | POA: Diagnosis not present

## 2023-04-13 DIAGNOSIS — I10 Essential (primary) hypertension: Secondary | ICD-10-CM | POA: Diagnosis not present

## 2023-04-13 DIAGNOSIS — G4733 Obstructive sleep apnea (adult) (pediatric): Secondary | ICD-10-CM

## 2023-04-13 DIAGNOSIS — E66813 Obesity, class 3: Secondary | ICD-10-CM

## 2023-04-13 DIAGNOSIS — F909 Attention-deficit hyperactivity disorder, unspecified type: Secondary | ICD-10-CM

## 2023-04-13 DIAGNOSIS — Z6841 Body Mass Index (BMI) 40.0 and over, adult: Secondary | ICD-10-CM

## 2023-04-13 MED ORDER — WEGOVY 1 MG/0.5ML ~~LOC~~ SOAJ: 1.0000 mg | SUBCUTANEOUS | 0 refills | Status: DC

## 2023-04-13 NOTE — Progress Notes (Signed)
 SUBJECTIVE: Discussed the use of AI scribe software for clinical note transcription with the patient, who gave verbal consent to proceed.  Chief Complaint: Obesity  Interim History: He is down 7 lbs since last visit.   Phillip Roach is a 43 year old male with obesity who presents for follow-up of his obesity treatment plan.  He is on Wegovy  1 mg weekly for weight management and has lost weight, adipose decreasing from 121.2 kg to 115.6 kg. His visceral adipose rating has decreased to 20. He experiences gastrointestinal side effects, including gas and irregular bowel movements, and uses Metamucil inconsistently. He follows a high-protein diet.  He has a history of prediabetes, hypertension, vitamin D  deficiency, and obstructive sleep apnea. For hypertension, he was initially on Norvasc  5 mg daily, and valsartan /hydrochlorothiazide  160/25 mg was added to his regimen yesterday.  He is currently taking Wellbutrin  and Effexor  for depression, which he believes may be contributing to his elevated blood pressure. He feels his ADHD is not being adequately addressed and has a history of being on Concerta, which he found beneficial. He experiences dizziness and balance issues when missing doses of Effexor . He does have plans to be evaluated by Phillip Roach for his ADHD.  He works night shifts and is concerned about his vitamin D  levels due to lack of sunlight exposure. He takes over-the-counter vitamin D  supplements, approximately 2000 IU, to maintain his levels. Phillip Roach is here to discuss his progress with his obesity treatment plan. He is on the keeping a food journal and adhering to recommended goals of 2400 calories and 135 grams of  protein and states he is following his eating plan approximately 75-80 % of the time. He states he is exercising 180 minutes 3-4 times per week.   OBJECTIVE: Visit Diagnoses: Problem List Items Addressed This Visit     Vitamin D  deficiency    Class 3 severe obesity with serious comorbidity and body mass index (BMI) of 40.0 to 44.9 in adult Phillip Roach)   Relevant Medications   Semaglutide -Weight Management (WEGOVY ) 1 MG/0.5ML SOAJ   Hypertension   Prediabetes - Primary   Obstructive sleep apnea syndrome   BMI 40.0-44.9, adult (HCC) Current BMI 40.0   Relevant Medications   Semaglutide -Weight Management (WEGOVY ) 1 MG/0.5ML SOAJ   Obesity (HCC)- Starting BMI 41.73   Relevant Medications   Semaglutide -Weight Management (WEGOVY ) 1 MG/0.5ML SOAJ  Obesity Phillip Roach, a 43 year old male, has shown significant progress on Wegovy  1 mg weekly, reducing  adipose mass from 121.2 kg to 115.6 kg. The current dose will be maintained to avoid potential side effects. Emphasized the importance of continuing his high-protein diet and exercise regimen to support weight loss and muscle maintenance. - Refill Wegovy  1 mg weekly - Continue high-protein diet and exercise regimen - Monitor muscle mass and weight  Prediabetes Phillip Roach's prediabetes management includes weight loss and dietary changes. He switched from Ozempic  to Wegovy  due to insurance issues. Emphasized the importance of maintaining a high-protein diet and monitoring blood glucose levels. Lab Results  Component Value Date   HGBA1C 5.6 11/25/2022   HGBA1C 5.1 05/07/2021   HGBA1C 5.6 01/20/2021   Lab Results  Component Value Date   LDLCALC 82 11/25/2022   CREATININE 1.03 11/25/2022   INSULIN   Date Value Ref Range Status  11/25/2022 14.7 2.6 - 24.9 uIU/mL Final   - Continue Wegovy  1 mg weekly - Maintain high-protein diet Continue working on nutrition plan to decrease simple carbohydrates, increase lean proteins and exercise to  promote weight loss, improve glycemic control and prevent progression to Type 2 diabetes.    Hypertension Phillip Roach's blood pressure has improved to 135/89 mmHg on Norvasc  5 mg daily and valsartan /hydrochlorothiazide  160/25 mg daily, but it is not yet at goal.  The addition of valsartan /hydrochlorothiazide  was recent. Discussed the potential impact of Wellbutrin  and Effexor  on his blood pressure. - Continue Norvasc  5 mg daily - Continue valsartan /hydrochlorothiazide  160/25 mg daily - Log blood pressures and follow up with primary care provider in 6-8 weeks as directed.  Continue to work on nutrition plan to promote weight loss and improve BP control.    ADHD and Depression Phillip Roach is on Wellbutrin  and Effexor , which may contribute to elevated blood pressure. He expressed frustration with current ADHD management. He reported significant improvement with leftover Concerta and is seeking care from Phillip Roach. Discussed potential benefits and risks of alternative ADHD medications that do not elevate blood pressure. - Complete and submit paperwork to Phillip Roach - Discuss potential changes in ADHD and depression management with Roach - Consider alternative ADHD medications that do not elevate blood pressure    Vitamin D  Deficiency Phillip Roach's vitamin D  deficiency has normalized with over-the-counter supplementation. He is currently taking 2000 IU daily, especially important due to his night shift work and limited sun exposure. - Continue over-the-counter vitamin D  2000 IU daily  Follow-up - Schedule follow-up appointment in four weeks on March 4th at 12:00 PM.  Vitals Temp: 98.8 F (37.1 C) BP: 135/89 Pulse Rate: 79 SpO2: 98 %   Anthropometric Measurements Height: 6' 2 (1.88 m) Weight: (!) 329 lb (149.2 kg) BMI (Calculated): 42.22 Weight at Last Visit: 336 lb Weight Lost Since Last Visit: 7 lb Weight Gained Since Last Visit: 0 Starting Weight: 325 lb Total Weight Loss (lbs): 7 lb (3.175 kg)   Body Composition  Body Fat %: 35.1 % Fat Mass (lbs): 115.6 lbs Muscle Mass (lbs): 203.2 lbs Total Body Water (lbs): 149.4 lbs Visceral Fat Rating : 20   Other Clinical Data Fasting: no Labs:  no Today's Visit #: 23 Starting Date: 09/24/19     ASSESSMENT AND PLAN:  Diet: Phillip Roach is currently in the action stage of change. As such, his goal is to continue with weight loss efforts. He has agreed to keeping a food journal and adhering to recommended goals of 2300 calories and 135 grams of protein.  Exercise: Phillip Roach has been instructed to work up to a goal of 150 minutes of combined cardio and strengthening exercise per week for weight loss and overall health benefits.   Behavior Modification:  We discussed the following Behavioral Modification Strategies today: increasing lean protein intake, decreasing simple carbohydrates, increasing vegetables, increase H2O intake, increase high fiber foods, no skipping meals, avoiding temptations, planning for success, and keep a strict food journal. We discussed various medication options to help Phillip Roach with his weight loss efforts and we both agreed to continue Wegovy  1 mg weekly for medical weight loss.  Return in about 4 weeks (around 05/11/2023).SABRA He was informed of the importance of frequent follow up visits to maximize his success with intensive lifestyle modifications for his multiple health conditions.  Attestation Statements:   Reviewed by clinician on day of visit: allergies, medications, problem list, medical history, surgical history, family history, social history, and previous encounter notes.   Time spent on visit including pre-visit chart review and post-visit care and charting was 41 minutes.    Dawid Dupriest, PA-C

## 2023-05-10 ENCOUNTER — Ambulatory Visit (INDEPENDENT_AMBULATORY_CARE_PROVIDER_SITE_OTHER): Payer: PRIVATE HEALTH INSURANCE | Admitting: Family Medicine

## 2023-05-10 ENCOUNTER — Encounter (INDEPENDENT_AMBULATORY_CARE_PROVIDER_SITE_OTHER): Payer: Self-pay | Admitting: Family Medicine

## 2023-05-10 DIAGNOSIS — E66813 Obesity, class 3: Secondary | ICD-10-CM

## 2023-05-10 DIAGNOSIS — F3289 Other specified depressive episodes: Secondary | ICD-10-CM | POA: Diagnosis not present

## 2023-05-10 DIAGNOSIS — I10 Essential (primary) hypertension: Secondary | ICD-10-CM

## 2023-05-10 DIAGNOSIS — Z6841 Body Mass Index (BMI) 40.0 and over, adult: Secondary | ICD-10-CM | POA: Diagnosis not present

## 2023-05-10 MED ORDER — BUPROPION HCL ER (XL) 150 MG PO TB24: 150.0000 mg | ORAL_TABLET | Freq: Every day | ORAL | 0 refills | Status: DC

## 2023-05-10 MED ORDER — WEGOVY 1.7 MG/0.75ML ~~LOC~~ SOAJ: 1.7000 mg | SUBCUTANEOUS | 0 refills | Status: DC

## 2023-05-10 NOTE — Assessment & Plan Note (Signed)
 Doing well on wellbutrin and effexor daily.  No SI or HI.  Needs refill of wellbutrin today.  Patient needs to get in contact with his psychiatry to discuss refill of effexor.  He will do that prior to our next appointment.

## 2023-05-10 NOTE — Progress Notes (Signed)
 SUBJECTIVE:  Chief Complaint: Obesity  Interim History: since last appointment patient has been doing most of his normal life stuff.  Patient is doing quite a bit of quilting recently. He is experiencing pretty significant sugar cravings recently.  He is trying to satisfy his cravings but not be too overindulgent.  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    Phillip Roach is here to discuss his progress with his obesity treatment plan. He is on the keeping a food journal and adhering to recommended goals of 2300 calories and 135 grams of protein and states he is following his eating plan approximately 75 % of the time. He states he is exercising working.   OBJECTIVE: Visit Diagnoses: Problem List Items Addressed This Visit       Cardiovascular and Mediastinum   Hypertension   Blood pressure well controlled today. He is on norvasc and diovan-hydrochlorothiazide. No chest pain, chest pressure or headache.  No change in medications at this time.  No refills needed.        Other   Class 3 severe obesity with serious comorbidity and body mass index (BMI) of 40.0 to 44.9 in adult Phillip Roach)   Relevant Medications   Semaglutide-Weight Management (WEGOVY) 1.7 MG/0.75ML SOAJ   Depression   Doing well on wellbutrin and effexor daily.  No SI or HI.  Needs refill of wellbutrin today.  Patient needs to get in contact with his psychiatry to discuss refill of effexor.  He will do that prior to our next appointment.      Relevant Medications   buPROPion (WELLBUTRIN XL) 150 MG 24 hr tablet   Obesity (HCC)- Starting BMI 41.73 - Primary   Relevant Medications   Semaglutide-Weight Management (WEGOVY) 1.7 MG/0.75ML SOAJ    Vitals Temp: 97.9 F (36.6 C) BP: 123/79 Pulse Rate: 90 SpO2: 98 %   Anthropometric Measurements Height: 6\' 2"  (1.88 m) Weight: (!) 335 lb (152 kg) BMI (Calculated): 42.99 Weight at Last Visit: 329 lb Weight Lost Since Last  Visit: 0 Weight Gained Since Last Visit: 6 Starting Weight: 325 lb Total Weight Loss (lbs): 0 lb (0 kg)   Body Composition  Body Fat %: 35.7 % Fat Mass (lbs): 119.6 lbs Muscle Mass (lbs): 205 lbs Total Body Water (lbs): 153.4 lbs Visceral Fat Rating : 21   Other Clinical Data Today's Visit #: 50 Starting Date: 09/24/19 Comments: 2300/135     ASSESSMENT AND PLAN:  Diet: Phillip Roach is currently in the action stage of change. As such, his goal is to continue with weight loss efforts and has agreed to keeping a food journal and adhering to recommended goals of 2300 calories and 135 or more grams of protein daily.   Exercise:  For substantial Roach benefits, adults should do at least 150 minutes (2 hours and 30 minutes) a week of moderate-intensity, or 75 minutes (1 hour and 15 minutes) a week of vigorous-intensity aerobic physical activity, or an equivalent combination of moderate- and vigorous-intensity aerobic activity. Aerobic activity should be performed in episodes of at least 10 minutes, and preferably, it should be spread throughout the week.  Behavior Modification:  We discussed the following Behavioral Modification Strategies today: increasing lean protein intake, increasing vegetables, meal planning and cooking strategies, better snacking choices, and keep a strict food journal. We discussed various medication options to help Phillip Roach with his weight loss efforts and we both agreed to increase Wegovy to 1.7mg  weekly.  No follow-ups on file.[name] He was informed of the importance of frequent follow up visits to maximize his success with intensive lifestyle modifications for his multiple Roach conditions.  Attestation Statements:   Reviewed by clinician on day of visit: allergies, medications, problem list, medical history, surgical history, family history, social history, and previous encounter notes.    Reuben Likes, MD

## 2023-05-10 NOTE — Assessment & Plan Note (Signed)
 Blood pressure well controlled today. He is on norvasc and diovan-hydrochlorothiazide. No chest pain, chest pressure or headache.  No change in medications at this time.  No refills needed.

## 2023-05-12 ENCOUNTER — Other Ambulatory Visit (INDEPENDENT_AMBULATORY_CARE_PROVIDER_SITE_OTHER): Payer: Self-pay | Admitting: Family Medicine

## 2023-05-12 DIAGNOSIS — F3289 Other specified depressive episodes: Secondary | ICD-10-CM

## 2023-05-16 ENCOUNTER — Telehealth (INDEPENDENT_AMBULATORY_CARE_PROVIDER_SITE_OTHER): Payer: Self-pay

## 2023-05-16 NOTE — Telephone Encounter (Signed)
 This letter is to notify you that your prior authorization request for WEGOVY 1.7 MG/0.75 ML  PEN has been approved based upon the information we received from you/your healthcare  practitioner.   Benefits for all services are subject to terms, conditions and eligibility as outlined in the  benefit documentation in effect at the time services are provided.  The authorization is effective from 05/13/2023 to 12/02/2023, as long as you are enrolled as  a member of your current health plan. The request was approved as submitted. This request has been approved for 3ml per 28 days.  Additional authorizations have been created for the following: Wegovy 0.25mg /0.60mL allowing 2mL per 28 days; please reference authorization [id number]. Wegovy 0.5mg /0.5mL allowing 2mL per 28 days; please reference authorization [id number]. Wegovy 1mg /0.11mL allowing 2mL per 28 days; please reference authorization [id number]. Wegovy 2.4mg /0.74mL allowing 3mL per 28 days; please reference authorization [id number].

## 2023-05-23 ENCOUNTER — Other Ambulatory Visit: Payer: Self-pay | Admitting: *Deleted

## 2023-05-23 ENCOUNTER — Telehealth: Payer: Self-pay

## 2023-05-23 ENCOUNTER — Other Ambulatory Visit (HOSPITAL_COMMUNITY): Payer: Self-pay

## 2023-05-23 DIAGNOSIS — E291 Testicular hypofunction: Secondary | ICD-10-CM

## 2023-05-23 NOTE — Telephone Encounter (Signed)
 Refill for testosterone cypionate (DEPOTESTOSTERONE CYPIONATE) 200 MG/ML injection  Requires a PA.

## 2023-05-23 NOTE — Telephone Encounter (Signed)
 Pharmacy Patient Advocate Encounter  Received notification from [facility] that Prior Authorization for Testosterone Cypionate 200MG /ML has been APPROVED from 05/23/23 to 05/22/24. Ran test claim, Copay is $20.00. This test claim was processed through [facility]- copay amounts may vary at other pharmacies due to pharmacy/plan contracts, or as the patient moves through the different stages of their insurance plan.   PA #/Case ID/Reference #: [id number]

## 2023-05-23 NOTE — Telephone Encounter (Signed)
 Pharmacy Patient Advocate Encounter   Received notification from [employer] that prior authorization for Testosterone Cypionate 200MG /ML intramuscular solution is required/requested.   Insurance verification completed.   The patient is insured through [facility] .   Per test claim: PA required; PA submitted to above mentioned insurance via CoverMyMeds Key/confirmation #/EOC [id number] Status is pending

## 2023-05-24 MED ORDER — TESTOSTERONE CYPIONATE 200 MG/ML IM SOLN: 200.0000 mg | INTRAMUSCULAR | 5 refills | Status: DC

## 2023-05-25 ENCOUNTER — Encounter (INDEPENDENT_AMBULATORY_CARE_PROVIDER_SITE_OTHER): Payer: Self-pay | Admitting: Family Medicine

## 2023-05-26 ENCOUNTER — Other Ambulatory Visit (INDEPENDENT_AMBULATORY_CARE_PROVIDER_SITE_OTHER): Payer: Self-pay | Admitting: Family Medicine

## 2023-05-26 MED ORDER — ZEPBOUND 5 MG/0.5ML ~~LOC~~ SOAJ: 5.0000 mg | SUBCUTANEOUS | 0 refills | Status: DC

## 2023-05-30 ENCOUNTER — Telehealth: Payer: Self-pay | Admitting: *Deleted

## 2023-05-30 NOTE — Telephone Encounter (Signed)
 Prior authorization done via cover my meds for patients Zepbound. Waiting on determination.

## 2023-05-30 NOTE — Telephone Encounter (Signed)
 Prior authorization approved for patients Zepbound.  Message from Plan Request Reference Number: [id number]. ZEPBOUND INJ 5/0.5ML is approved through 11/30/2023. Your patient may now fill this prescription and it will be covered.. Authorization Expiration Date: November 30, 2023.

## 2023-06-07 ENCOUNTER — Encounter: Payer: Self-pay | Admitting: Internal Medicine

## 2023-06-07 ENCOUNTER — Ambulatory Visit: Payer: Managed Care, Other (non HMO) | Admitting: Internal Medicine

## 2023-06-07 VITALS — BP 120/80 | HR 80 | Temp 97.7°F | Wt 339.4 lb

## 2023-06-07 DIAGNOSIS — I10 Essential (primary) hypertension: Secondary | ICD-10-CM | POA: Diagnosis not present

## 2023-06-07 LAB — BASIC METABOLIC PANEL WITH GFR
BUN: 16 mg/dL (ref 6–23)
CO2: 27 meq/L (ref 19–32)
Calcium: 9.1 mg/dL (ref 8.4–10.5)
Chloride: 101 meq/L (ref 96–112)
Creatinine, Ser: 0.95 mg/dL (ref 0.40–1.50)
GFR: 99.18 mL/min (ref >60.00)
Glucose, Bld: 87 mg/dL (ref 70–99)
Potassium: 4 meq/L (ref 3.5–5.1)
Sodium: 138 meq/L (ref 135–145)

## 2023-06-07 NOTE — Progress Notes (Signed)
 Established Patient Office Visit     CC/Reason for Visit: Follow-up blood pressure  HPI: Phillip Roach is a 43 y.o. male who is coming in today for the above mentioned reasons. Past Medical History is significant for: Hypertension.  At last visit we added Diovan HCTZ to his amlodipine.  He is here today to follow-up.  Blood pressure is now at goal.   Past Medical/Surgical History: Past Medical History:  Diagnosis Date   ADHD    ADHD (attention deficit hyperactivity disorder)    Asthma    as a child, no inhaler - exercise and seasonal allergies induced   Back pain    Depression    Fatigue    GERD (gastroesophageal reflux disease)    in past , no current problems   Heartburn    Hypertension    in past = no current problems, no meds   Joint pain    Lactose intolerance    Overweight    Pre-diabetes    Sleep apnea    does not use cpap   Vitamin D deficiency     Past Surgical History:  Procedure Laterality Date   FOREHEAD RECONSTRUCTION Right    Right Temple Shrapnel Removed   PAROTIDECTOMY Left 06/24/2020   Procedure: SUPERFICIAL PAROTIDECTOMY WITH FACIAL DISSECTION;  Surgeon: Drema Halon, MD;  Location: [facility] OR;  Service: ENT;  Laterality: Left;   PILONIDAL CYST DRAINAGE     WISDOM TOOTH EXTRACTION      Social History:  reports that he has never smoked. He has never used smokeless tobacco. He reports current alcohol use. He reports that he does not use drugs.  Allergies: Allergies  Allergen Reactions   Shellfish Allergy Anaphylaxis    Iodine ok per pt   Metformin And Related Rash   Keflex [Cephalexin] Rash    Family History:  Family History  Problem Relation Age of Onset   GER disease Mother    Depression Mother    Alcohol abuse Mother    Obesity Mother    Anxiety disorder Father    Hypertension Father    Sleep apnea Father    Obesity Father      Current Outpatient Medications:    acetaminophen (TYLENOL) 500 MG tablet, Take 1  tablet (500 mg total) by mouth every 6 (six) hours as needed. (Patient taking differently: Take 1,000 mg by mouth every 6 (six) hours as needed for moderate pain (pain score 4-6) or headache.), Disp: 30 tablet, Rfl: 0   amLODipine (NORVASC) 5 MG tablet, Take 1 tablet (5 mg total) by mouth daily., Disp: 90 tablet, Rfl: 1   buPROPion (WELLBUTRIN XL) 150 MG 24 hr tablet, Take 150 mg by mouth daily., Disp: , Rfl:    ibuprofen (ADVIL,MOTRIN) 200 MG tablet, Take 800 mg by mouth every 6 (six) hours as needed for pain., Disp: , Rfl:    loratadine (CLARITIN) 10 MG tablet, Take 10 mg by mouth daily., Disp: , Rfl:    testosterone cypionate (DEPOTESTOSTERONE CYPIONATE) 200 MG/ML injection, Inject 1 mL (200 mg total) into the muscle every 14 (fourteen) days., Disp: 10 mL, Rfl: 5   tirzepatide (ZEPBOUND) 5 MG/0.5ML Pen, Inject 5 mg into the skin once a week., Disp: 2 mL, Rfl: 0   triamcinolone (NASACORT) 55 MCG/ACT AERO nasal inhaler, Place 1 spray into the nose daily., Disp: , Rfl:    valsartan-hydrochlorothiazide (DIOVAN-HCT) 160-25 MG tablet, Take 1 tablet by mouth daily., Disp: 90 tablet, Rfl: 1  venlafaxine XR (EFFEXOR-XR) 150 MG 24 hr capsule, Take 1 capsule (150 mg total) by mouth daily with breakfast., Disp: 30 capsule, Rfl: 2   Semaglutide-Weight Management (WEGOVY) 1.7 MG/0.75ML SOAJ, Inject 1.7 mg into the skin once a week. (Patient not taking: Reported on 06/07/2023), Disp: 3 mL, Rfl: 0  Review of Systems:  Negative unless indicated in HPI.   Physical Exam: Vitals:   06/07/23 1049  BP: 120/80  Pulse: 80  Temp: 97.7 F (36.5 C)  TempSrc: Oral  SpO2: 97%  Weight: (!) 339 lb 6.4 oz (154 kg)    Body mass index is 43.58 kg/m.    Impression and Plan:  Primary hypertension -     Basic metabolic panel with GFR; Future  -Blood pressure is well-controlled on amlodipine and Diovan HCT.  Check BMP today to follow renal function and electrolytes.   Time spent:22 minutes reviewing chart,  interviewing and examining patient and formulating plan of care.     Chaya Jan, MD [facility]

## 2023-06-08 ENCOUNTER — Encounter (INDEPENDENT_AMBULATORY_CARE_PROVIDER_SITE_OTHER): Payer: Self-pay | Admitting: Family Medicine

## 2023-06-08 ENCOUNTER — Ambulatory Visit (INDEPENDENT_AMBULATORY_CARE_PROVIDER_SITE_OTHER): Payer: PRIVATE HEALTH INSURANCE | Admitting: Family Medicine

## 2023-06-08 VITALS — BP 129/81 | HR 79 | Temp 97.6°F | Ht 74.0 in | Wt 336.0 lb

## 2023-06-08 DIAGNOSIS — E669 Obesity, unspecified: Secondary | ICD-10-CM

## 2023-06-08 DIAGNOSIS — F909 Attention-deficit hyperactivity disorder, unspecified type: Secondary | ICD-10-CM | POA: Diagnosis not present

## 2023-06-08 DIAGNOSIS — E782 Mixed hyperlipidemia: Secondary | ICD-10-CM

## 2023-06-08 DIAGNOSIS — Z6841 Body Mass Index (BMI) 40.0 and over, adult: Secondary | ICD-10-CM | POA: Diagnosis not present

## 2023-06-08 DIAGNOSIS — E785 Hyperlipidemia, unspecified: Secondary | ICD-10-CM | POA: Insufficient documentation

## 2023-06-08 MED ORDER — ZEPBOUND 5 MG/0.5ML ~~LOC~~ SOAJ: 5.0000 mg | SUBCUTANEOUS | 0 refills | Status: DC

## 2023-06-08 NOTE — Assessment & Plan Note (Signed)
 Patient last LDL was at goal but prior was elevated.  Logging food and monitoring total intake of fats to less than 20% daily.

## 2023-06-08 NOTE — Assessment & Plan Note (Signed)
 Appointment on May 14th for initial appointment.  Patient voices he is noticing more difficulty with attention not on his medication.  Will follow up at next appointment concerning treatment plan.

## 2023-06-08 NOTE — Progress Notes (Signed)
 SUBJECTIVE:  Chief Complaint: Obesity  Interim History: Patient here for follow up.  He has been hitting his protein goal about 85% of the time.  He has found the protein goal to be expensive.  He is doing Muscle Milk pro and finds that is the only way he can get all the nutrition in.  He has noticed his carb intake goes up as the pay period gets closer to close.  He and his family do not have plans for spring break.  Phillip Roach is here to discuss his progress with his obesity treatment plan. He is on the keeping a food journal and adhering to recommended goals of 2300 calories and 135 grams of protein and states he is following his eating plan approximately 90 % of the time. He states he is exercising 30 minutes 7 times per week.   OBJECTIVE: Visit Diagnoses: Problem List Items Addressed This Visit       Other   ADHD (attention deficit hyperactivity disorder)   Appointment on May 14th for initial appointment.  Patient voices he is noticing more difficulty with attention not on his medication.  Will follow up at next appointment concerning treatment plan.      BMI 40.0-44.9, adult (HCC) Current BMI 40.0 - Primary   Relevant Medications   tirzepatide (ZEPBOUND) 5 MG/0.5ML Pen   Obesity (HCC)- Starting BMI 41.73   Relevant Medications   tirzepatide (ZEPBOUND) 5 MG/0.5ML Pen   HLD (hyperlipidemia)   Patient last LDL was at goal but prior was elevated.  Logging food and monitoring total intake of fats to less than 20% daily.       Vitals Temp: 97.6 F (36.4 C) BP: 129/81 Pulse Rate: 79 SpO2: 97 %   Anthropometric Measurements Height: 6\' 2"  (1.88 m) Weight: (!) 336 lb (152.4 kg) BMI (Calculated): 43.12 Weight at Last Visit: 335 lb Weight Lost Since Last Visit: 0 Weight Gained Since Last Visit: 1 Starting Weight: 325 lb Total Weight Loss (lbs): 0 lb (0 kg)   Body Composition  Body Fat %: 36.3 % Fat Mass (lbs): 122 lbs Muscle Mass (lbs): 203.6 lbs Total Body Water  (lbs): 155 lbs Visceral Fat Rating : 21   Other Clinical Data Today's Visit #: 51 Starting Date: 09/24/19 Comments: 2300/135     ASSESSMENT AND PLAN:  Diet: Phillip Roach is currently in the action stage of change. As such, his goal is to continue with weight loss efforts and has agreed to keeping a food journal and adhering to recommended goals of 2300 calories and 135 or more grams of protein daily.   Exercise:  For substantial health benefits, adults should do at least 150 minutes (2 hours and 30 minutes) a week of moderate-intensity, or 75 minutes (1 hour and 15 minutes) a week of vigorous-intensity aerobic physical activity, or an equivalent combination of moderate- and vigorous-intensity aerobic activity. Aerobic activity should be performed in episodes of at least 10 minutes, and preferably, it should be spread throughout the week.  Behavior Modification:  We discussed the following Behavioral Modification Strategies today: increasing lean protein intake, increasing vegetables, meal planning and cooking strategies, avoiding temptations, and planning for success. We discussed various medication options to help Phillip Roach with his weight loss efforts and we both agreed to continue zepbound at current 5mg  weekly.  Return in about 5 weeks (around 07/13/2023) for fasting labs.. He was informed of the importance of frequent follow up visits to maximize his success with intensive lifestyle modifications for  his multiple health conditions.  Attestation Statements:   Reviewed by clinician on day of visit: allergies, medications, problem list, medical history, surgical history, family history, social history, and previous encounter notes.     Reuben Likes, MD

## 2023-06-28 ENCOUNTER — Other Ambulatory Visit: Payer: Self-pay | Admitting: Internal Medicine

## 2023-06-28 DIAGNOSIS — F3289 Other specified depressive episodes: Secondary | ICD-10-CM

## 2023-07-05 ENCOUNTER — Encounter (INDEPENDENT_AMBULATORY_CARE_PROVIDER_SITE_OTHER): Payer: Self-pay | Admitting: Family Medicine

## 2023-07-14 ENCOUNTER — Ambulatory Visit (INDEPENDENT_AMBULATORY_CARE_PROVIDER_SITE_OTHER): Payer: PRIVATE HEALTH INSURANCE | Admitting: Family Medicine

## 2023-07-14 ENCOUNTER — Encounter (INDEPENDENT_AMBULATORY_CARE_PROVIDER_SITE_OTHER): Payer: Self-pay | Admitting: Family Medicine

## 2023-07-14 VITALS — BP 139/89 | HR 75 | Temp 97.6°F | Ht 74.0 in | Wt 335.0 lb

## 2023-07-14 DIAGNOSIS — E785 Hyperlipidemia, unspecified: Secondary | ICD-10-CM | POA: Diagnosis not present

## 2023-07-14 DIAGNOSIS — R7303 Prediabetes: Secondary | ICD-10-CM

## 2023-07-14 DIAGNOSIS — I10 Essential (primary) hypertension: Secondary | ICD-10-CM | POA: Diagnosis not present

## 2023-07-14 DIAGNOSIS — E782 Mixed hyperlipidemia: Secondary | ICD-10-CM

## 2023-07-14 DIAGNOSIS — F909 Attention-deficit hyperactivity disorder, unspecified type: Secondary | ICD-10-CM | POA: Diagnosis not present

## 2023-07-14 DIAGNOSIS — E559 Vitamin D deficiency, unspecified: Secondary | ICD-10-CM

## 2023-07-14 DIAGNOSIS — Z6841 Body Mass Index (BMI) 40.0 and over, adult: Secondary | ICD-10-CM

## 2023-07-14 MED ORDER — ZEPBOUND 5 MG/0.5ML ~~LOC~~ SOAJ: 5.0000 mg | SUBCUTANEOUS | 0 refills | Status: DC

## 2023-07-14 NOTE — Progress Notes (Signed)
 SUBJECTIVE:  Chief Complaint: Obesity  Interim History: Here for 1 month follow up.  There was signifiicant issue concerning Zepbound  and getting medication covered at a local pharmacy.  He has been working on sticking more closely to the meal plan of calories and protein.  He is very driven to snacking and has hit his protein goal but has gone over calories.Phillip Roach  He is working on increasing his awareness of reasons why he snacks.  He has been trying to be mindful of achieving protein goal at meals.  He is planning to put some time aside to start doing more physical activity. He is going to [facility] on the 14th of this month.   Phillip Roach is here to discuss his progress with his obesity treatment plan. He is on the keeping a food journal and adhering to recommended goals of 2300 calories and 135 grams of protein and states he is following his eating plan approximately 90 % of the time. He states he is not exercising, is walking a lot.  OBJECTIVE: Visit Diagnoses: Problem List Items Addressed This Visit       Cardiovascular and Mediastinum   Hypertension   Blood pressure higher end of normal today.  No chest pain, chest pressure or headache.  Will follow up on BP at next appointment especially if patient starts stimulant medication.      Relevant Orders   Comprehensive metabolic panel with GFR (Completed)     Other   ADHD (attention deficit hyperactivity disorder) - Primary   Upcoming appointment with [facility].  Previously on medication which he felt really benefited him in terms of focus and attention span.  Follow up on plan at next appointment.      Vitamin D  deficiency   Vitamin D  level today.  Discuss results at next appointment.  Fatigue worse.      Relevant Orders   VITAMIN D  25 Hydroxy (Vit-D Deficiency, Fractures) (Completed)   Prediabetes   Still experiencing desire for simple carbohydrates.  A1c and Insulin  level today.       Relevant Orders   Hemoglobin A1c (Completed)   Insulin , random (Completed)   BMI 40.0-44.9, adult (HCC) Current BMI 40.0   Relevant Medications   tirzepatide  (ZEPBOUND ) 5 MG/0.5ML Pen   Obesity (HCC)- Starting BMI 41.73   Anthropometric Measurements Height: 6\' 2"  (1.88 m) Weight: (!) 335 lb (152 kg) BMI (Calculated): 42.99 Weight at Last Visit: 336 lb Weight Lost Since Last Visit: 1 Weight Gained Since Last Visit: 0 Starting Weight: 325 lb Total Weight Loss (lbs): 0 lb (0 kg) Body Composition  Body Fat %: 36.7 % Fat Mass (lbs): 123 lbs Muscle Mass (lbs): 202 lbs Total Body Water (lbs): 152.2 lbs Visceral Fat Rating : 22 Other Clinical Data Fasting: yes Labs: yes Today's Visit #: 52 Starting Date: 09/24/19 Comments: 2300/135       Relevant Medications   tirzepatide  (ZEPBOUND ) 5 MG/0.5ML Pen   HLD (hyperlipidemia)   Previously elevated LDL.  Will repeat lipid panel today and discuss results at next appointment.      Relevant Orders   Lipid Panel With LDL/HDL Ratio (Completed)    No data recorded    Vitals:   07/14/23 0800  BP: 139/89  Pulse: 75  Temp: 97.6 F (36.4 C)  SpO2: 97%      ASSESSMENT AND PLAN:  Diet: Phillip Roach is currently in the action stage of change. As such, his goal is to continue with weight loss efforts  and has agreed to keeping a food journal and adhering to recommended goals of 2300 calories and 135 or more grams protein daily.  Patient to start food log or journaling meal plan.  The initial goal will be to habitually log or journal for at least 4 days a week.  The expectation it that patient may not initially meet calorie or protein goals as the nturitional understanding of food intake is begun.  We discussed the 10:1 ratio when reading a food label.  Patient agrees to keep a food log either electronically or on paper and bring to the next appointment to be able to dissect and discuss it with provider.    Exercise:  For substantial health  benefits, adults should do at least 150 minutes (2 hours and 30 minutes) a week of moderate-intensity, or 75 minutes (1 hour and 15 minutes) a week of vigorous-intensity aerobic physical activity, or an equivalent combination of moderate- and vigorous-intensity aerobic activity. Aerobic activity should be performed in episodes of at least 10 minutes, and preferably, it should be spread throughout the week.  Behavior Modification:  We discussed the following Behavioral Modification Strategies today: increasing lean protein intake, decreasing simple carbohydrates, ways to avoid boredom eating, better snacking choices, emotional eating strategies , avoiding temptations, and planning for success. We discussed various medication options to help Phillip Roach with his weight loss efforts and we both agreed to start zepbound  weekly.  Return in about 4 weeks (around 08/11/2023).   He was informed of the importance of frequent follow up visits to maximize his success with intensive lifestyle modifications for his multiple health conditions.  Attestation Statements:   Reviewed by clinician on day of visit: allergies, medications, problem list, medical history, surgical history, family history, social history, and previous encounter notes.   Phillip Frizzle, MD

## 2023-07-18 LAB — COMPREHENSIVE METABOLIC PANEL WITH GFR
ALT: 35 IU/L (ref 0–44)
AST: 21 IU/L (ref 0–40)
Albumin: 4.6 g/dL (ref 4.1–5.1)
Alkaline Phosphatase: 59 IU/L (ref 44–121)
BUN/Creatinine Ratio: 16 (ref 9–20)
BUN: 14 mg/dL (ref 6–24)
Bilirubin Total: 0.3 mg/dL (ref 0.0–1.2)
CO2: 22 mmol/L (ref 20–29)
Calcium: 9.1 mg/dL (ref 8.7–10.2)
Chloride: 100 mmol/L (ref 96–106)
Creatinine, Ser: 0.88 mg/dL (ref 0.76–1.27)
Globulin, Total: 2.3 g/dL (ref 1.5–4.5)
Glucose: 93 mg/dL (ref 70–99)
Potassium: 4.2 mmol/L (ref 3.5–5.2)
Sodium: 138 mmol/L (ref 134–144)
Total Protein: 6.9 g/dL (ref 6.0–8.5)
eGFR: 111 mL/min/{1.73_m2} (ref >59)

## 2023-07-18 LAB — LIPID PANEL WITH LDL/HDL RATIO
Cholesterol, Total: 181 mg/dL (ref 100–199)
HDL: 31 mg/dL — ABNORMAL LOW (ref >39)
LDL Chol Calc (NIH): 115 mg/dL — ABNORMAL HIGH (ref 0–99)
LDL/HDL Ratio: 3.7 ratio — ABNORMAL HIGH (ref 0.0–3.6)
Triglycerides: 201 mg/dL — ABNORMAL HIGH (ref 0–149)
VLDL Cholesterol Cal: 35 mg/dL (ref 5–40)

## 2023-07-18 LAB — INSULIN, RANDOM: INSULIN: 19.8 u[IU]/mL (ref 2.6–24.9)

## 2023-07-18 LAB — VITAMIN D 25 HYDROXY (VIT D DEFICIENCY, FRACTURES): Vit D, 25-Hydroxy: 44.7 ng/mL (ref 30.0–100.0)

## 2023-07-18 LAB — HEMOGLOBIN A1C
Est. average glucose Bld gHb Est-mCnc: 123 mg/dL
Hgb A1c MFr Bld: 5.9 % — ABNORMAL HIGH (ref 4.8–5.6)

## 2023-07-25 NOTE — Assessment & Plan Note (Signed)
 Vitamin D  level today.  Discuss results at next appointment.  Fatigue worse.

## 2023-07-25 NOTE — Assessment & Plan Note (Signed)
 Anthropometric Measurements Height: 6\' 2"  (1.88 m) Weight: (!) 335 lb (152 kg) BMI (Calculated): 42.99 Weight at Last Visit: 336 lb Weight Lost Since Last Visit: 1 Weight Gained Since Last Visit: 0 Starting Weight: 325 lb Total Weight Loss (lbs): 0 lb (0 kg) Body Composition  Body Fat %: 36.7 % Fat Mass (lbs): 123 lbs Muscle Mass (lbs): 202 lbs Total Body Water (lbs): 152.2 lbs Visceral Fat Rating : 22 Other Clinical Data Fasting: yes Labs: yes Today's Visit #: 52 Starting Date: 09/24/19 Comments: 2300/135

## 2023-07-25 NOTE — Assessment & Plan Note (Signed)
 Still experiencing desire for simple carbohydrates.  A1c and Insulin  level today.

## 2023-07-25 NOTE — Assessment & Plan Note (Signed)
 Blood pressure higher end of normal today.  No chest pain, chest pressure or headache.  Will follow up on BP at next appointment especially if patient starts stimulant medication.

## 2023-07-25 NOTE — Assessment & Plan Note (Signed)
 Previously elevated LDL.  Will repeat lipid panel today and discuss results at next appointment.

## 2023-07-25 NOTE — Assessment & Plan Note (Signed)
 Upcoming appointment with [facility].  Previously on medication which he felt really benefited him in terms of focus and attention span.  Follow up on plan at next appointment.

## 2023-08-25 ENCOUNTER — Encounter (INDEPENDENT_AMBULATORY_CARE_PROVIDER_SITE_OTHER): Payer: Self-pay | Admitting: Family Medicine

## 2023-08-25 ENCOUNTER — Ambulatory Visit (INDEPENDENT_AMBULATORY_CARE_PROVIDER_SITE_OTHER): Payer: PRIVATE HEALTH INSURANCE | Admitting: Family Medicine

## 2023-08-25 VITALS — BP 124/79 | HR 87 | Temp 98.2°F | Ht 74.0 in | Wt 337.0 lb

## 2023-08-25 DIAGNOSIS — F909 Attention-deficit hyperactivity disorder, unspecified type: Secondary | ICD-10-CM | POA: Diagnosis not present

## 2023-08-25 DIAGNOSIS — E669 Obesity, unspecified: Secondary | ICD-10-CM | POA: Diagnosis not present

## 2023-08-25 DIAGNOSIS — I10 Essential (primary) hypertension: Secondary | ICD-10-CM

## 2023-08-25 DIAGNOSIS — Z6841 Body Mass Index (BMI) 40.0 and over, adult: Secondary | ICD-10-CM | POA: Diagnosis not present

## 2023-08-25 MED ORDER — LISDEXAMFETAMINE DIMESYLATE 20 MG PO CAPS: 20.0000 mg | ORAL_CAPSULE | Freq: Every day | ORAL | Status: DC

## 2023-08-25 MED ORDER — ZEPBOUND 7.5 MG/0.5ML ~~LOC~~ SOAJ: 7.5000 mg | SUBCUTANEOUS | 0 refills | Status: DC

## 2023-08-25 NOTE — Progress Notes (Unsigned)
 SUBJECTIVE:  Chief Complaint: Obesity  Interim History: Patient here for follow up.  He is on Zepbound  and Vyvanse since last appointment and is feeling slightly better but not at ideal dose currently of either medication.  He mentions he feels like he could increase his dose of zepbound .  He is ready to start decreasing effexor .  He has started going to the gym the days he isn't working.  He found out that the place he works has a gym on site.  He has been doing a significant amount of chicken recently.  He and his family are going to the beach next week to [place].    Phillip Roach is here to discuss his progress with his obesity treatment plan. He is on the Journaling plan 2300/ calories 135 protein Daily and states he is not following his eating plan approximately 50-75 % of the time. He states he is not exercising yet.   OBJECTIVE: Visit Diagnoses: Problem List Items Addressed This Visit       Cardiovascular and Mediastinum   Hypertension   Blood pressure well controlled today.  No chest pain, chest pressure or headache.  On diovan -hydrochlorothiazide  and norvasc .  No chest pain, chest pressure or headache.  No change in doses or meds recently.         Other   ADHD (attention deficit hyperactivity disorder) - Primary   Relevant Medications   lisdexamfetamine (VYVANSE) 20 MG capsule   BMI 40.0-44.9, adult (HCC) Current BMI 40.0   Relevant Medications   lisdexamfetamine (VYVANSE) 20 MG capsule   tirzepatide  (ZEPBOUND ) 7.5 MG/0.5ML Pen   Obesity (HCC)- Starting BMI 41.73   Relevant Medications   lisdexamfetamine (VYVANSE) 20 MG capsule   tirzepatide  (ZEPBOUND ) 7.5 MG/0.5ML Pen    Vitals Temp: 98.2 F (36.8 C) BP: 124/79 Pulse Rate: 87 SpO2: 97 %   Anthropometric Measurements Height: 6' 2 (1.88 m) Weight: (!) 337 lb (152.9 kg) BMI (Calculated): 43.25 Weight at Last Visit: 335 lb Weight Lost Since Last Visit: 0 Weight Gained Since Last Visit: 2 lb Starting Weight: 325  lb Total Weight Loss (lbs): 0 lb (0 kg)   Body Composition  Body Fat %: 37.3 % Fat Mass (lbs): 125.8 lbs Muscle Mass (lbs): 201.4 lbs Total Body Water (lbs): 153.4 lbs Visceral Fat Rating : 22   Other Clinical Data Fasting: no Labs: no Today's Visit #: 53 Starting Date: 09/24/19 Comments: 2300/135     ASSESSMENT AND PLAN:  Diet: Phillip Roach is currently in the action stage of change. As such, his goal is to continue with weight loss efforts and has agreed to keeping a food journal and adhering to recommended goals of 2300 calories and 135 or more grams of protein.   Exercise:  For substantial health benefits, adults should do at least 150 minutes (2 hours and 30 minutes) a week of moderate-intensity, or 75 minutes (1 hour and 15 minutes) a week of vigorous-intensity aerobic physical activity, or an equivalent combination of moderate- and vigorous-intensity aerobic activity. Aerobic activity should be performed in episodes of at least 10 minutes, and preferably, it should be spread throughout the week.  Behavior Modification:  We discussed the following Behavioral Modification Strategies today: increasing lean protein intake, decreasing simple carbohydrates, increasing vegetables, meal planning and cooking strategies, and keeping healthy foods in the home. We discussed various medication options to help Phillip Roach with his weight loss efforts and we both agreed to increase Zepbound  to 7.5mg  weekly.  Return in about 4 weeks (around  09/22/2023).   He was informed of the importance of frequent follow up visits to maximize his success with intensive lifestyle modifications for his multiple health conditions.  Attestation Statements:   Reviewed by clinician on day of visit: allergies, medications, problem list, medical history, surgical history, family history, social history, and previous encounter notes.    Donaciano Frizzle, MD

## 2023-08-25 NOTE — Assessment & Plan Note (Signed)
 Blood pressure well controlled today.  No chest pain, chest pressure or headache.  On diovan -hydrochlorothiazide  and norvasc .  No chest pain, chest pressure or headache.  No change in doses or meds recently.

## 2023-08-28 NOTE — Assessment & Plan Note (Signed)
 Anthropometric Measurements Height: 6' 2 (1.88 m) Weight: (!) 337 lb (152.9 kg) BMI (Calculated): 43.25 Weight at Last Visit: 335 lb Weight Lost Since Last Visit: 0 Weight Gained Since Last Visit: 2 lb Starting Weight: 325 lb Total Weight Loss (lbs): 0 lb (0 kg) Body Composition  Body Fat %: 37.3 % Fat Mass (lbs): 125.8 lbs Muscle Mass (lbs): 201.4 lbs Total Body Water (lbs): 153.4 lbs Visceral Fat Rating : 22 Other Clinical Data Fasting: no Labs: no Today's Visit #: 53 Starting Date: 09/24/19 Comments: 2300/135

## 2023-08-28 NOTE — Assessment & Plan Note (Signed)
 Restarted on Vyvanse  and feeling improved concentration.  Being managed by [facility].  Med list updated

## 2023-09-07 ENCOUNTER — Encounter: Payer: Self-pay | Admitting: Internal Medicine

## 2023-09-08 ENCOUNTER — Telehealth: Payer: Self-pay

## 2023-09-08 ENCOUNTER — Other Ambulatory Visit (HOSPITAL_COMMUNITY): Payer: Self-pay

## 2023-09-08 NOTE — Telephone Encounter (Signed)
 Note sent by Mychart.

## 2023-09-08 NOTE — Telephone Encounter (Signed)
 Pharmacy Patient Advocate Encounter   Received notification from Pt Calls Messages that prior authorization for Testosterone  Cyp 200 is required/requested.   Insurance verification completed.   The patient is insured through [facility] .   Per test claim: The current 28 day co-pay is, $13.64.  No PA needed at this time. This test claim was processed through [facility]- copay amounts may vary at other pharmacies due to pharmacy/plan contracts, or as the patient moves through the different stages of their insurance plan.     Patient has quantity limits, no more than a 30 day supply. PA expires 01/25/24.

## 2023-09-22 ENCOUNTER — Encounter (INDEPENDENT_AMBULATORY_CARE_PROVIDER_SITE_OTHER): Payer: Self-pay | Admitting: Family Medicine

## 2023-09-22 ENCOUNTER — Telehealth (INDEPENDENT_AMBULATORY_CARE_PROVIDER_SITE_OTHER): Payer: Self-pay | Admitting: Family Medicine

## 2023-09-22 ENCOUNTER — Ambulatory Visit (INDEPENDENT_AMBULATORY_CARE_PROVIDER_SITE_OTHER): Payer: PRIVATE HEALTH INSURANCE | Admitting: Family Medicine

## 2023-09-22 VITALS — BP 126/80 | HR 97 | Temp 98.1°F | Ht 74.0 in | Wt 330.0 lb

## 2023-09-22 DIAGNOSIS — F909 Attention-deficit hyperactivity disorder, unspecified type: Secondary | ICD-10-CM | POA: Diagnosis not present

## 2023-09-22 DIAGNOSIS — Z6841 Body Mass Index (BMI) 40.0 and over, adult: Secondary | ICD-10-CM

## 2023-09-22 DIAGNOSIS — R7303 Prediabetes: Secondary | ICD-10-CM

## 2023-09-22 MED ORDER — LISDEXAMFETAMINE DIMESYLATE 40 MG PO CAPS: 40.0000 mg | ORAL_CAPSULE | Freq: Every day | ORAL | Status: AC

## 2023-09-22 MED ORDER — ZEPBOUND 10 MG/0.5ML ~~LOC~~ SOAJ: 10.0000 mg | SUBCUTANEOUS | 0 refills | Status: DC

## 2023-09-22 MED ORDER — TIRZEPATIDE 10 MG/0.5ML ~~LOC~~ SOAJ: 10.0000 mg | SUBCUTANEOUS | 0 refills | Status: DC

## 2023-09-22 NOTE — Progress Notes (Signed)
 SUBJECTIVE:  Chief Complaint: Obesity  Interim History: Patient here for follow up. 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  He has been quite a bit more active.  He mentions the Vyvanse  has really been helping.  He has quite a bit of activities around the house to keep him busy. He and his wife are dealing with 42 year old temper tantrums.  He is anticipating getting more consistently back on plan since he and his wife are getting ahead on their finances.  Phillip Roach is here to discuss his progress with his obesity treatment plan. He is on the keeping a food journal and adhering to recommended goals of 2300 calories and 135 grams of protein and states he is following his eating plan approximately 50 % of the time. He states he is walking at work.   OBJECTIVE: Visit Diagnoses: Problem List Items Addressed This Visit       Other   ADHD (attention deficit hyperactivity disorder) - Primary   Relevant Medications   lisdexamfetamine (VYVANSE ) 40 MG capsule   Prediabetes   BMI 40.0-44.9, adult (HCC) Current BMI 40.0   Relevant Medications   lisdexamfetamine (VYVANSE ) 40 MG capsule   tirzepatide  (MOUNJARO ) 10 MG/0.5ML Pen   Obesity (HCC)- Starting BMI 41.73   Relevant Medications   lisdexamfetamine (VYVANSE ) 40 MG capsule   tirzepatide  (MOUNJARO ) 10 MG/0.5ML Pen    Vitals Temp: 98.1 F (36.7 C) BP: 126/80 Pulse Rate: 97 SpO2: 99 %   Anthropometric Measurements Height: 6' 2 (1.88 m) Weight: (!) 330 lb (149.7 kg) BMI (Calculated): 42.35 Weight at Last Visit: 337 lb Weight Lost Since Last Visit: 7 Weight Gained Since Last Visit: 0 Starting Weight: 325 lb Total Weight Loss (lbs): 0 lb (0 kg)   Body Composition  Body Fat %: 36.8 % Fat Mass (lbs): 121.8 lbs Muscle Mass (lbs): 198.8 lbs Total Body Water (lbs): 145 lbs Visceral Fat Rating : 25   Other Clinical Data Fasting: no Labs: no Today's Visit #: 54 Starting  Date: 09/24/19 Comments: 2300/135     ASSESSMENT AND PLAN: Assessment & Plan Attention deficit hyperactivity disorder (ADHD), unspecified ADHD type Recent change in dose to 40mg  of Vyvanse .  Being managed by [facility].  Needs a refill but will ask martinique attention provider.  Feels better managed. Prediabetes On Mounjaro  with better control of intake- close to goal in terms of calories and protein.  Needs a refill today.  Will reassess dose at next appointment. Morbid obesity (HCC)  BMI 40.0-44.9, adult (HCC) Current BMI 42.3    Diet: Phillip Roach is currently in the action stage of change. As such, his goal is to continue with weight loss efforts and has agreed to keeping a food journal and adhering to recommended goals of 2300 calories and 135 or more grams of protein daily.   Exercise:  For substantial health benefits, adults should do at least 150 minutes (2 hours and 30 minutes) a week of moderate-intensity, or 75 minutes (1 hour and 15 minutes) a week of vigorous-intensity aerobic physical activity, or an equivalent combination of moderate- and vigorous-intensity aerobic activity. Aerobic activity should be performed in episodes of at least 10 minutes, and preferably, it should be spread throughout the week.  Behavior Modification:  We discussed the following Behavioral Modification Strategies today: increasing lean protein intake, decreasing simple carbohydrates, increasing vegetables, meal planning and cooking strategies, planning for success, and keep a strict food  journal. We discussed various medication options to help Phillip Roach with his weight loss efforts and we both agreed to increase zepbound  to 10mg  weekly.  Return in about 4 weeks (around 10/20/2023).   He was informed of the importance of frequent follow up visits to maximize his success with intensive lifestyle modifications for his multiple health conditions.  Attestation Statements:   Reviewed by  clinician on day of visit: allergies, medications, problem list, medical history, surgical history, family history, social history, and previous encounter notes.     Adelita Cho, MD

## 2023-09-22 NOTE — Telephone Encounter (Signed)
 7/17 [facility] refill Pharm. CALLED WANTING TO KNOW IF THE PT WAS SWITCHED FROM ZEPBOUND  TO MONJAROAND DID SHE NEED TO FILL IT? [phone]

## 2023-09-22 NOTE — Telephone Encounter (Signed)
 New Rx sent for Zepbound  (Epic error)

## 2023-10-03 NOTE — Assessment & Plan Note (Signed)
 Recent change in dose to 40mg  of Vyvanse .  Being managed by [facility].  Needs a refill but will ask martinique attention provider.  Feels better managed.

## 2023-10-03 NOTE — Assessment & Plan Note (Signed)
 On Mounjaro  with better control of intake- close to goal in terms of calories and protein.  Needs a refill today.  Will reassess dose at next appointment.

## 2023-10-20 ENCOUNTER — Encounter (INDEPENDENT_AMBULATORY_CARE_PROVIDER_SITE_OTHER): Payer: Self-pay | Admitting: Family Medicine

## 2023-10-20 ENCOUNTER — Ambulatory Visit (INDEPENDENT_AMBULATORY_CARE_PROVIDER_SITE_OTHER): Payer: PRIVATE HEALTH INSURANCE | Admitting: Family Medicine

## 2023-10-20 VITALS — BP 134/84 | HR 90 | Temp 98.0°F | Ht 74.0 in | Wt 331.0 lb

## 2023-10-20 DIAGNOSIS — R7303 Prediabetes: Secondary | ICD-10-CM | POA: Diagnosis not present

## 2023-10-20 DIAGNOSIS — Z6841 Body Mass Index (BMI) 40.0 and over, adult: Secondary | ICD-10-CM | POA: Diagnosis not present

## 2023-10-20 DIAGNOSIS — I10 Essential (primary) hypertension: Secondary | ICD-10-CM

## 2023-10-20 MED ORDER — ZEPBOUND 10 MG/0.5ML ~~LOC~~ SOAJ
10.0000 mg | SUBCUTANEOUS | 0 refills | Status: DC
Start: 2023-10-20 — End: 2023-11-17

## 2023-10-20 NOTE — Assessment & Plan Note (Addendum)
 Blood pressure well controlled today.  No chest pain, chest pressure or headache.  Stable on norvasc , and diovan - hydrochlorothiazide .  No refills of medication needed today.

## 2023-10-20 NOTE — Progress Notes (Signed)
 SUBJECTIVE:  Chief Complaint: Obesity  Interim History: Patient's kids are back at school now and that has been a relief for him.  Last two weeks he has had the boys at home and has not been as active.  The tirzepatide  is leading to significant diarrhea for a few days after injection for about 3 days.  He has been staying under calorie count and hasn't been getting in the same protein quantity as he is planning to do.  Patient signed up with a personal trainer to make commitment to physical activity.  Phillip Roach is here to discuss his progress with his obesity treatment plan. He is on the keeping a food journal and adhering to recommended goals of 2300 calories and 135 grams of protein and states he is following his eating plan approximately 75 % of the time. He states he is exercising 60 minutes 1 times per week.   OBJECTIVE: Visit Diagnoses: Problem List Items Addressed This Visit       Cardiovascular and Mediastinum   Hypertension   Blood pressure well controlled today.  No chest pain, chest pressure or headache.  Stable on norvasc , and diovan - hydrochlorothiazide .          Other   Prediabetes - Primary   Relevant Medications   tirzepatide  (ZEPBOUND ) 10 MG/0.5ML Pen   BMI 40.0-44.9, adult (HCC) Current BMI 40.0   Relevant Medications   tirzepatide  (ZEPBOUND ) 10 MG/0.5ML Pen   Obesity (HCC)- Starting BMI 41.73   Relevant Medications   tirzepatide  (ZEPBOUND ) 10 MG/0.5ML Pen    Vitals Temp: 98 F (36.7 C) BP: 134/84 Pulse Rate: 90 SpO2: 98 %   Anthropometric Measurements Height: 6' 2 (1.88 m) Weight: (!) 331 lb (150.1 kg) BMI (Calculated): 42.48 Weight at Last Visit: 330 lb Weight Lost Since Last Visit: 0 Weight Gained Since Last Visit: 1 Starting Weight: 325 lb Total Weight Loss (lbs): 0 lb (0 kg)   Body Composition  Body Fat %: 36 % Fat Mass (lbs): 119.2 lbs Muscle Mass (lbs): 201.8 lbs Total Body Water (lbs): 150.8 lbs Visceral Fat Rating : 21   Other  Clinical Data Today's Visit #: 55 Starting Date: 09/24/19 Comments: 2300/135     ASSESSMENT AND PLAN: Assessment & Plan Prediabetes Most recent A1c of 5.9 in May 2025.  Patient has done well on tirzepatide .  Will need repeat labs in November at 30-month mark. Primary hypertension Blood pressure well controlled today.  No chest pain, chest pressure or headache.  Stable on norvasc , and diovan - hydrochlorothiazide .  No refills of medication needed today. Morbid obesity (HCC)  BMI 40.0-44.9, adult (HCC) Current BMI 42.3    Diet: Phillip Roach is currently in the action stage of change. As such, his goal is to continue with weight loss efforts and has agreed to keeping a food journal and adhering to recommended goals of 2300 calories and 135 or more grams of protein daily.   Exercise:  For substantial health benefits, adults should do at least 150 minutes (2 hours and 30 minutes) a week of moderate-intensity, or 75 minutes (1 hour and 15 minutes) a week of vigorous-intensity aerobic physical activity, or an equivalent combination of moderate- and vigorous-intensity aerobic activity. Aerobic activity should be performed in episodes of at least 10 minutes, and preferably, it should be spread throughout the week.  Behavior Modification:  We discussed the following Behavioral Modification Strategies today: increasing lean protein intake, decreasing simple carbohydrates, increasing vegetables, meal planning and cooking strategies, planning for success, and keep  a strict food journal. We discussed various medication options to help Phillip Roach with his weight loss efforts and we both agreed to continue zepbound  at current dose.  No follow-ups on file.   He was informed of the importance of frequent follow up visits to maximize his success with intensive lifestyle modifications for his multiple health conditions.  Attestation Statements:   Reviewed by clinician on day of visit: allergies, medications,  problem list, medical history, surgical history, family history, social history, and previous encounter notes.   Adelita Cho, MD

## 2023-10-23 ENCOUNTER — Other Ambulatory Visit: Payer: Self-pay | Admitting: Internal Medicine

## 2023-10-23 DIAGNOSIS — I10 Essential (primary) hypertension: Secondary | ICD-10-CM

## 2023-10-30 NOTE — Assessment & Plan Note (Signed)
 Most recent A1c of 5.9 in May 2025.  Patient has done well on tirzepatide .  Will need repeat labs in November at 9-month mark.

## 2023-11-10 ENCOUNTER — Other Ambulatory Visit (INDEPENDENT_AMBULATORY_CARE_PROVIDER_SITE_OTHER): Payer: Self-pay | Admitting: Family Medicine

## 2023-11-10 ENCOUNTER — Encounter: Payer: Self-pay | Admitting: Internal Medicine

## 2023-11-10 DIAGNOSIS — I10 Essential (primary) hypertension: Secondary | ICD-10-CM

## 2023-11-10 MED ORDER — BUPROPION HCL ER (XL) 150 MG PO TB24: 150.0000 mg | ORAL_TABLET | Freq: Every day | ORAL | 0 refills | Status: DC

## 2023-11-10 MED ORDER — AMLODIPINE BESYLATE 5 MG PO TABS: 5.0000 mg | ORAL_TABLET | Freq: Every day | ORAL | 0 refills | Status: DC

## 2023-11-17 ENCOUNTER — Encounter (INDEPENDENT_AMBULATORY_CARE_PROVIDER_SITE_OTHER): Payer: Self-pay | Admitting: Family Medicine

## 2023-11-17 ENCOUNTER — Ambulatory Visit (INDEPENDENT_AMBULATORY_CARE_PROVIDER_SITE_OTHER): Payer: PRIVATE HEALTH INSURANCE | Admitting: Family Medicine

## 2023-11-17 VITALS — BP 131/81 | HR 94 | Temp 97.9°F | Ht 74.0 in | Wt 328.0 lb

## 2023-11-17 DIAGNOSIS — R7303 Prediabetes: Secondary | ICD-10-CM | POA: Diagnosis not present

## 2023-11-17 DIAGNOSIS — F32A Depression, unspecified: Secondary | ICD-10-CM | POA: Diagnosis not present

## 2023-11-17 DIAGNOSIS — Z6841 Body Mass Index (BMI) 40.0 and over, adult: Secondary | ICD-10-CM

## 2023-11-17 DIAGNOSIS — F419 Anxiety disorder, unspecified: Secondary | ICD-10-CM | POA: Diagnosis not present

## 2023-11-17 MED ORDER — ZEPBOUND 10 MG/0.5ML ~~LOC~~ SOAJ: 10.0000 mg | SUBCUTANEOUS | 0 refills | Status: DC

## 2023-11-17 MED ORDER — ZEPBOUND 12.5 MG/0.5ML ~~LOC~~ SOAJ: 12.5000 mg | SUBCUTANEOUS | 0 refills | Status: DC

## 2023-11-17 MED ORDER — BUPROPION HCL ER (XL) 150 MG PO TB24
150.0000 mg | ORAL_TABLET | Freq: Every day | ORAL | 0 refills | Status: AC
Start: 2023-11-17 — End: ?

## 2023-11-17 NOTE — Progress Notes (Signed)
 SUBJECTIVE:  Chief Complaint: Obesity  Interim History: Patient has been getting back into routine with his kids going back to school and changing his kids extracurricular schedules. Patient has been doing quite a bit of protein shakes in the am.  He mentions this is helping with his appetite.  His medications are assisting in his control of snacking.  He is working out with a Psychologist, educational and mentions he is walking quite a bit when working.  Phillip Roach is here to discuss his progress with his obesity treatment plan. He is on the keeping a food journal and adhering to recommended goals of 2300 calories and 135 grams of protein and states he is following his eating plan approximately 85 % of the time. He states he is exercising with a trainer once a week for 30 minutes and 6 hours of walking at work.   OBJECTIVE: Visit Diagnoses: Problem List Items Addressed This Visit       Other   Prediabetes - Primary   Relevant Medications   tirzepatide  (ZEPBOUND ) 12.5 MG/0.5ML Pen   BMI 40.0-44.9, adult (HCC) Current BMI 40.0   Relevant Medications   tirzepatide  (ZEPBOUND ) 12.5 MG/0.5ML Pen   Obesity (HCC)- Starting BMI 41.73   Relevant Medications   tirzepatide  (ZEPBOUND ) 12.5 MG/0.5ML Pen   Other Visit Diagnoses       Anxiety and depression       Relevant Medications   buPROPion  (WELLBUTRIN  XL) 150 MG 24 hr tablet       Vitals Temp: 97.9 F (36.6 C) BP: 131/81 Pulse Rate: 94 SpO2: 98 %   Anthropometric Measurements Height: 6' 2 (1.88 m) Weight: (!) 328 lb (148.8 kg) BMI (Calculated): 42.09 Weight at Last Visit: 331 lb Weight Lost Since Last Visit: 3 Weight Gained Since Last Visit: 0 Starting Weight: 325 lb Total Weight Loss (lbs): 0 lb (0 kg)   Body Composition  Body Fat %: 36.2 % Fat Mass (lbs): 118.8 lbs Muscle Mass (lbs): 199.2 lbs Total Body Water (lbs): 149 lbs Visceral Fat Rating : 21   Other Clinical Data Today's Visit #: 60 Starting Date: 09/24/19 Comments:  2300/135     ASSESSMENT AND PLAN: Assessment & Plan Prediabetes Tolerating Zepbound  with minimal, if any, GI side effects.  Needs a refill today.  Due to insufficient internal food drive control and increased carb intake will increase zepbound  to 12.5mg  weekly.  Follow up at next appointment concerning response. Anxiety and depression Wellbutrin  helping with craving control, focus and depression.  Needs refill today of current dose. Blood pressure well controlled and no side effects reported. Morbid obesity (HCC)  BMI 40.0-44.9, adult (HCC) Current BMI 42.3    Diet: [place] is currently in the action stage of change. As such, his goal is to continue with weight loss efforts and has agreed to keeping a food journal and adhering to recommended goals of 2300 calories and 135 or more grams protein daily.   Exercise:  For substantial health benefits, adults should do at least 150 minutes (2 hours and 30 minutes) a week of moderate-intensity, or 75 minutes (1 hour and 15 minutes) a week of vigorous-intensity aerobic physical activity, or an equivalent combination of moderate- and vigorous-intensity aerobic activity. Aerobic activity should be performed in episodes of at least 10 minutes, and preferably, it should be spread throughout the week.  Behavior Modification:  We discussed the following Behavioral Modification Strategies today: increasing lean protein intake, decreasing simple carbohydrates, increasing vegetables, meal planning and cooking strategies, keeping healthy  foods in the home, and keep a strict food journal. We discussed various medication options to help Asiah with his weight loss efforts and we both agreed to continue zepbound  at current dose.  Return in about 5 weeks (around 12/22/2023).   He was informed of the importance of frequent follow up visits to maximize his success with intensive lifestyle modifications for his multiple health conditions.  Attestation Statements:    Reviewed by clinician on day of visit: allergies, medications, problem list, medical history, surgical history, family history, social history, and previous encounter notes.     Adelita Cho, MD

## 2023-11-29 NOTE — Assessment & Plan Note (Signed)
 Tolerating Zepbound  with minimal, if any, GI side effects.  Needs a refill today.  Due to insufficient internal food drive control and increased carb intake will increase zepbound  to 12.5mg  weekly.  Follow up at next appointment concerning response.

## 2023-12-10 ENCOUNTER — Other Ambulatory Visit: Payer: Self-pay | Admitting: Internal Medicine

## 2023-12-10 DIAGNOSIS — E291 Testicular hypofunction: Secondary | ICD-10-CM

## 2023-12-20 ENCOUNTER — Encounter (INDEPENDENT_AMBULATORY_CARE_PROVIDER_SITE_OTHER): Payer: Self-pay | Admitting: Family Medicine

## 2023-12-20 ENCOUNTER — Ambulatory Visit (INDEPENDENT_AMBULATORY_CARE_PROVIDER_SITE_OTHER): Payer: PRIVATE HEALTH INSURANCE | Admitting: Family Medicine

## 2023-12-20 VITALS — BP 108/72 | HR 96 | Temp 98.1°F | Ht 74.0 in | Wt 318.0 lb

## 2023-12-20 DIAGNOSIS — Z6841 Body Mass Index (BMI) 40.0 and over, adult: Secondary | ICD-10-CM

## 2023-12-20 DIAGNOSIS — E291 Testicular hypofunction: Secondary | ICD-10-CM | POA: Diagnosis not present

## 2023-12-20 DIAGNOSIS — E559 Vitamin D deficiency, unspecified: Secondary | ICD-10-CM

## 2023-12-20 DIAGNOSIS — R7303 Prediabetes: Secondary | ICD-10-CM

## 2023-12-20 MED ORDER — ZEPBOUND 12.5 MG/0.5ML ~~LOC~~ SOAJ: 12.5000 mg | SUBCUTANEOUS | 0 refills | Status: DC

## 2023-12-20 NOTE — Progress Notes (Unsigned)
   SUBJECTIVE:  Chief Complaint: Obesity  Interim History: Patient has been dealing with a significant amount of up and downs over the last month.  His wife's car recently died and he and his wife are navigating buying a car.  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 He has recently been eating more canned tuna.   Phillip Roach is here to discuss his progress with his obesity treatment plan. He is on the keeping a food journal and adhering to recommended goals of 2300 calories and 135 grams of protein and states he is following his eating plan approximately 80 % of the time. He states he is exercising 30 minutes 7 times per week.   OBJECTIVE: Visit Diagnoses: Problem List Items Addressed This Visit   None   Vitals Temp: 98.1 F (36.7 C) BP: 108/72 Pulse Rate: 96 SpO2: 99 %   Anthropometric Measurements Height: 6' 2 (1.88 m) Weight: (!) 318 lb (144.2 kg) BMI (Calculated): 40.81 Weight at Last Visit: 328 lb Weight Lost Since Last Visit: 10 Starting Weight: 325 lb Total Weight Loss (lbs): 7 lb (3.175 kg)   Body Composition  Body Fat %: 35.1 % Fat Mass (lbs): 111.8 lbs Muscle Mass (lbs): 196.6 lbs Total Body Water (lbs): 144 lbs Visceral Fat Rating : 20   Other Clinical Data Today's Visit #: 37 Starting Date: 09/24/19 Comments: 2300/135     ASSESSMENT AND PLAN: Assessment & Plan Androgen deficiency Patient feeling markedly more tired recently.  Will draw testosterone  levels today to ensure patient is within normal range on supplementation. Prediabetes Will order A1c to evaluate blood sugar control over the last three months.  Plan to discuss labs at next appointment. Vitamin D  deficiency Significant fatigue over the last few weeks.  Will order Vitamin D  level so re-evaluate deficiency as possible etiology. Morbid obesity (HCC)  BMI 40.0-44.9, adult (HCC) Current BMI 42.3    Diet: Phillip Roach is currently in the action stage of change. As such, his goal is to  continue with weight loss efforts and has agreed to keeping a food journal and adhering to recommended goals of 2300 calories and 150 or more grams of protein.   Exercise:  For substantial health benefits, adults should do at least 150 minutes (2 hours and 30 minutes) a week of moderate-intensity, or 75 minutes (1 hour and 15 minutes) a week of vigorous-intensity aerobic physical activity, or an equivalent combination of moderate- and vigorous-intensity aerobic activity. Aerobic activity should be performed in episodes of at least 10 minutes, and preferably, it should be spread throughout the week.  Behavior Modification:  We discussed the following Behavioral Modification Strategies today: increasing lean protein intake, decreasing simple carbohydrates, and planning for success. We discussed various medication options to help Phillip Roach with his weight loss efforts and we both agreed to continue zepbound  at current dose and re-evaluate dosage at next appointment.  No follow-ups on file.   He was informed of the importance of frequent follow up visits to maximize his success with intensive lifestyle modifications for his multiple health conditions.  Attestation Statements:   Reviewed by clinician on day of visit: allergies, medications, problem list, medical history, surgical history, family history, social history, and previous encounter notes.     Adelita Cho, MD

## 2023-12-22 ENCOUNTER — Other Ambulatory Visit: Payer: Self-pay | Admitting: Internal Medicine

## 2023-12-22 DIAGNOSIS — F3289 Other specified depressive episodes: Secondary | ICD-10-CM

## 2023-12-22 LAB — TESTOSTERONE,FREE AND TOTAL
Testosterone, Free: 20.2 pg/mL (ref 6.8–21.5)
Testosterone: 822 ng/dL (ref 264–916)

## 2023-12-22 LAB — HEMOGLOBIN A1C
Est. average glucose Bld gHb Est-mCnc: 105 mg/dL
Hgb A1c MFr Bld: 5.3 % (ref 4.8–5.6)

## 2023-12-22 LAB — VITAMIN D 25 HYDROXY (VIT D DEFICIENCY, FRACTURES): Vit D, 25-Hydroxy: 30.2 ng/mL (ref 30.0–100.0)

## 2023-12-22 NOTE — Assessment & Plan Note (Signed)
 Significant fatigue over the last few weeks.  Will order Vitamin D  level so re-evaluate deficiency as possible etiology.

## 2023-12-22 NOTE — Assessment & Plan Note (Signed)
 Patient feeling markedly more tired recently.  Will draw testosterone  levels today to ensure patient is within normal range on supplementation.

## 2023-12-22 NOTE — Assessment & Plan Note (Signed)
 Will order A1c to evaluate blood sugar control over the last three months.  Plan to discuss labs at next appointment.

## 2023-12-28 ENCOUNTER — Ambulatory Visit (INDEPENDENT_AMBULATORY_CARE_PROVIDER_SITE_OTHER): Payer: PRIVATE HEALTH INSURANCE | Admitting: Family Medicine

## 2024-01-19 ENCOUNTER — Ambulatory Visit (INDEPENDENT_AMBULATORY_CARE_PROVIDER_SITE_OTHER): Payer: PRIVATE HEALTH INSURANCE | Admitting: Family Medicine

## 2024-01-19 VITALS — BP 103/66 | HR 92 | Temp 97.9°F | Ht 74.0 in | Wt 315.0 lb

## 2024-01-19 DIAGNOSIS — R7303 Prediabetes: Secondary | ICD-10-CM | POA: Diagnosis not present

## 2024-01-19 DIAGNOSIS — Z6841 Body Mass Index (BMI) 40.0 and over, adult: Secondary | ICD-10-CM | POA: Diagnosis not present

## 2024-01-19 DIAGNOSIS — E559 Vitamin D deficiency, unspecified: Secondary | ICD-10-CM

## 2024-01-19 MED ORDER — VITAMIN D (ERGOCALCIFEROL) 1.25 MG (50000 UNIT) PO CAPS: 50000.0000 [IU] | ORAL_CAPSULE | ORAL | 0 refills | Status: DC

## 2024-01-19 MED ORDER — ZEPBOUND 12.5 MG/0.5ML ~~LOC~~ SOAJ: 12.5000 mg | SUBCUTANEOUS | 0 refills | Status: DC

## 2024-01-19 NOTE — Progress Notes (Signed)
 SUBJECTIVE:  Chief Complaint: Obesity  Interim History: patient has been doing very well professionally recently.  He is working consistently on his meal plan in terms of calories and protein and had to force himself to eat frequently.  He is not snacking to the extent he was.  He is doing much less take out and that has been very helpful in mindful eating.  For Thanksgiving he is going to his brother in laws house.  Planning to stay local for the month of December.    Phillip Roach is here to discuss his progress with his obesity treatment plan. He is on the keeping a food journal and adhering to recommended goals of 2300 calories and 150 grams of protein and states he is following his eating plan approximately 100 % of the time. He states he is exercising 30 minutes 7 times per week.   OBJECTIVE: Visit Diagnoses: Problem List Items Addressed This Visit   None   Vitals Temp: 97.9 F (36.6 C) BP: 103/66 Pulse Rate: 92 SpO2: 98 %   Anthropometric Measurements Height: 6' 2 (1.88 m) Weight: (!) 315 lb (142.9 kg) BMI (Calculated): 40.43 Weight at Last Visit: 318 lb Weight Lost Since Last Visit: 3 Weight Gained Since Last Visit: 01 Starting Weight: 325 lb Total Weight Loss (lbs): 10 lb (4.536 kg)   Body Composition  Body Fat %: 34.6 % Fat Mass (lbs): 109 lbs Muscle Mass (lbs): 196.2 lbs Total Body Water (lbs): 143.8 lbs Visceral Fat Rating : 19   Other Clinical Data Today's Visit #: 75 Starting Date: 09/24/19 Comments: 2300/150     ASSESSMENT AND PLAN: Assessment & Plan Prediabetes Patient is doing well on GLP-1 injectable therapy for assistance in managing his obesity as well as his prediabetes.  Most recent A1c done 1 month ago was well-controlled at 5.3.  This is a decrease from 5.9 from May of this year.  Continue current pharmacotherapy for treatment.  Will need repeat labs in February or March 2026. Vitamin D  deficiency Vitamin D  level has decreased from 44-30  without prescription strength supplementation.  Will restart prescription strength vitamin D  in 7-month prescription sent into pharmacy today.  Follow-up repeat levels in March to ensure that patient is adequately replaced before transitioning over to over-the-counter. BMI 40.0-44.9, adult (HCC) Current BMI 42.3  Morbid obesity (HCC)    Diet: Phillip Roach is currently in the action stage of change. As such, his goal is to continue with weight loss efforts and has agreed to keeping a food journal and adhering to recommended goals of 2300 calories and 150 or more grams of protein daily.   Exercise:  For additional and more extensive health benefits, adults should increase their aerobic physical activity to 300 minutes (5 hours) a week of moderate-intensity, or 150 minutes a week of vigorous-intensity aerobic physical activity, or an equivalent combination of moderate- and vigorous-intensity activity. Additional health benefits are gained by engaging in physical activity beyond this amount.   Behavior Modification:  We discussed the following Behavioral Modification Strategies today: increasing lean protein intake, decreasing simple carbohydrates, increasing vegetables, meal planning and cooking strategies, planning for success, and keep a strict food journal. We discussed various medication options to help Phillip Roach with his weight loss efforts and we both agreed to continue zepbound  at current dose.  No follow-ups on file.   He was informed of the importance of frequent follow up visits to maximize his success with intensive lifestyle modifications for his multiple health conditions.  Attestation  Statements:   Reviewed by clinician on day of visit: allergies, medications, problem list, medical history, surgical history, family history, social history, and previous encounter notes.     Adelita Cho, MD

## 2024-01-26 NOTE — Assessment & Plan Note (Signed)
 Vitamin D  level has decreased from 44-30 without prescription strength supplementation.  Will restart prescription strength vitamin D  in 61-month prescription sent into pharmacy today.  Follow-up repeat levels in March to ensure that patient is adequately replaced before transitioning over to over-the-counter.

## 2024-01-26 NOTE — Assessment & Plan Note (Signed)
 Patient is doing well on GLP-1 injectable therapy for assistance in managing his obesity as well as his prediabetes.  Most recent A1c done 1 month ago was well-controlled at 5.3.  This is a decrease from 5.9 from May of this year.  Continue current pharmacotherapy for treatment.  Will need repeat labs in February or March 2026.

## 2024-02-23 ENCOUNTER — Encounter (INDEPENDENT_AMBULATORY_CARE_PROVIDER_SITE_OTHER): Payer: Self-pay | Admitting: Family Medicine

## 2024-02-23 ENCOUNTER — Ambulatory Visit (INDEPENDENT_AMBULATORY_CARE_PROVIDER_SITE_OTHER): Admitting: Family Medicine

## 2024-02-23 VITALS — BP 126/69 | HR 100 | Temp 97.4°F | Ht 74.0 in | Wt 319.0 lb

## 2024-02-23 DIAGNOSIS — I10 Essential (primary) hypertension: Secondary | ICD-10-CM | POA: Diagnosis not present

## 2024-02-23 DIAGNOSIS — E559 Vitamin D deficiency, unspecified: Secondary | ICD-10-CM | POA: Diagnosis not present

## 2024-02-23 DIAGNOSIS — R7303 Prediabetes: Secondary | ICD-10-CM | POA: Diagnosis not present

## 2024-02-23 DIAGNOSIS — Z6841 Body Mass Index (BMI) 40.0 and over, adult: Secondary | ICD-10-CM

## 2024-02-23 MED ORDER — ZEPBOUND 12.5 MG/0.5ML ~~LOC~~ SOAJ: 12.5000 mg | SUBCUTANEOUS | 0 refills | Status: DC

## 2024-02-23 NOTE — Progress Notes (Signed)
 "  SUBJECTIVE:  Chief Complaint: Obesity  Interim History: Patient had a good Thanksgiving- worked.  He isn't ready for Christmas yet. He has been journaling inconsistently and he was ill for last week. That has been leading to inconsistency of intake.  He states that has led to eating what is available. Staying local for Christmas and is having some people over.    Phillip Roach is here to discuss his progress with his obesity treatment plan. He is on the keeping a food journal and adhering to recommended goals of 2300 calories and 150 grams of protein and states he is following his eating plan approximately 80-90 % of the time. He states he is exercising 30 minutes 1 times per week and is working.   OBJECTIVE: Visit Diagnoses: Problem List Items Addressed This Visit       Cardiovascular and Mediastinum   Hypertension - Primary     Other   Vitamin D  deficiency   Prediabetes   Relevant Medications   tirzepatide  (ZEPBOUND ) 12.5 MG/0.5ML Pen   BMI 40.0-44.9, adult (HCC) Current BMI 40.0   Relevant Medications   tirzepatide  (ZEPBOUND ) 12.5 MG/0.5ML Pen   Obesity (HCC)- Starting BMI 41.73   Relevant Medications   tirzepatide  (ZEPBOUND ) 12.5 MG/0.5ML Pen    Vitals Temp: (!) 97.4 F (36.3 C) BP: 126/69 Pulse Rate: 100 SpO2: 98 %   Anthropometric Measurements Height: 6' 2 (1.88 m) Weight: (!) 319 lb (144.7 kg) BMI (Calculated): 40.94 Weight at Last Visit: 315 lb Weight Lost Since Last Visit: 0 Weight Gained Since Last Visit: 4 Starting Weight: 325 lb Total Weight Loss (lbs): 6 lb (2.722 kg)   Body Composition  Body Fat %: 35.5 % Fat Mass (lbs): 113.4 lbs Muscle Mass (lbs): 196 lbs Total Body Water (lbs): 144.6 lbs Visceral Fat Rating : 20   Other Clinical Data Today's Visit #: 22 Starting Date: 09/24/19 Comments: 2300/150     ASSESSMENT AND PLAN: Assessment & Plan Primary hypertension Blood pressure well-controlled today.  Tolerating current prescriptions for  blood pressure management.  Continue current treatment plan and pharmacotherapy.  Follow-up blood pressures at next appointment. Vitamin D  deficiency On prescription strength vitamin D  with no side effects of replacement mention.  Will continue current treatment of vitamin D  replacement and no refill needed today. Prediabetes Patient is working on blood sugar by monitoring his dietary intake of simple carbohydrates.  He is trying to stay focused on protein consumption with a goal of 1.2 to 1.5 g/kg of body weight of protein consumption daily.  He is taking injectable GLP-1/GIP pharmacotherapy for further management of prediabetes and weight.  No significant side effects mentioned of tirzepatide  at this time. BMI 40.0-44.9, adult (HCC) Current BMI 42.3  Morbid obesity (HCC)    Diet: Darryel is currently in the action stage of change. As such, his goal is to continue with weight loss efforts and has agreed to keeping a food journal and adhering to recommended goals of 2300 calories and 150 or more grams of protein daily.   Exercise:  For substantial health benefits, adults should do at least 150 minutes (2 hours and 30 minutes) a week of moderate-intensity, or 75 minutes (1 hour and 15 minutes) a week of vigorous-intensity aerobic physical activity, or an equivalent combination of moderate- and vigorous-intensity aerobic activity. Aerobic activity should be performed in episodes of at least 10 minutes, and preferably, it should be spread throughout the week.  Behavior Modification:  We discussed the following Behavioral Modification Strategies today:  increasing lean protein intake, decreasing simple carbohydrates, meal planning and cooking strategies, planning for success, and keep a strict food journal. We discussed various medication options to help Alessio with his weight loss efforts and we both agreed to continue zepbound .  Return in about 6 weeks (around 04/05/2024).   He was informed of the  importance of frequent follow up visits to maximize his success with intensive lifestyle modifications for his multiple health conditions.  Attestation Statements:   Reviewed by clinician on day of visit: allergies, medications, problem list, medical history, surgical history, family history, social history, and previous encounter notes.   Adelita Cho, MD "

## 2024-03-05 NOTE — Assessment & Plan Note (Signed)
 Patient is working on blood sugar by monitoring his dietary intake of simple carbohydrates.  He is trying to stay focused on protein consumption with a goal of 1.2 to 1.5 g/kg of body weight of protein consumption daily.  He is taking injectable GLP-1/GIP pharmacotherapy for further management of prediabetes and weight.  No significant side effects mentioned of tirzepatide  at this time.

## 2024-03-05 NOTE — Assessment & Plan Note (Signed)
 On prescription strength vitamin D  with no side effects of replacement mention.  Will continue current treatment of vitamin D  replacement and no refill needed today.

## 2024-03-05 NOTE — Assessment & Plan Note (Signed)
 Blood pressure well-controlled today.  Tolerating current prescriptions for blood pressure management.  Continue current treatment plan and pharmacotherapy.  Follow-up blood pressures at next appointment.

## 2024-04-04 ENCOUNTER — Ambulatory Visit (INDEPENDENT_AMBULATORY_CARE_PROVIDER_SITE_OTHER): Admitting: Family Medicine

## 2024-04-04 DIAGNOSIS — Z6841 Body Mass Index (BMI) 40.0 and over, adult: Secondary | ICD-10-CM

## 2024-04-04 DIAGNOSIS — E559 Vitamin D deficiency, unspecified: Secondary | ICD-10-CM | POA: Diagnosis not present

## 2024-04-04 DIAGNOSIS — R7303 Prediabetes: Secondary | ICD-10-CM | POA: Diagnosis not present

## 2024-04-04 DIAGNOSIS — I10 Essential (primary) hypertension: Secondary | ICD-10-CM | POA: Diagnosis not present

## 2024-04-04 MED ORDER — VITAMIN D (ERGOCALCIFEROL) 1.25 MG (50000 UNIT) PO CAPS: 50000.0000 [IU] | ORAL_CAPSULE | ORAL | 0 refills | Status: AC

## 2024-04-04 MED ORDER — ZEPBOUND 12.5 MG/0.5ML ~~LOC~~ SOAJ: 12.5000 mg | SUBCUTANEOUS | 0 refills | Status: AC

## 2024-04-04 MED ORDER — AMLODIPINE BESYLATE 5 MG PO TABS: 5.0000 mg | ORAL_TABLET | Freq: Every day | ORAL | 0 refills | Status: AC

## 2024-04-04 NOTE — Assessment & Plan Note (Signed)
 Tolerating tirzepatide  at current dose with good dietary intake control and no side effects.  Needs refill today- continue current treatment plan.

## 2024-04-04 NOTE — Assessment & Plan Note (Signed)
 No nausea, vomiting or muscle weakness mentioned.  Needs refill of prescription strength Vitamin D  at this time.  Last vitamin d  level below goal.

## 2024-04-04 NOTE — Assessment & Plan Note (Signed)
 Blood pressure well controlled today on current medication regimen.  Tolerating amlodipine  without any side effects. No chest pain, chest pressure or headache mentioned.  Needs refill today of amlodipine  at same dosage.
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
## 2024-04-06 ENCOUNTER — Telehealth (INDEPENDENT_AMBULATORY_CARE_PROVIDER_SITE_OTHER): Payer: Self-pay

## 2024-04-06 NOTE — Telephone Encounter (Signed)
 Prior auth for Zepbound  12.5 mg started by Deitra RAMAN., CMA. Awaiting determination.   Call to [facility] to find out what's taking so long for the determination.  Spoke with [employer].  She states that the turnaround time is up to 14 business days. She called over to the prior auth team and they upgraded it to urgent, which the that time is 72 hours, business days.  [facility] has not yet replied to your PA request. You may close this dialog, return to your dashboard, and perform other tasks.  To check for an update later, open this request again from your dashboard.  If [facility] has not replied within 72 hours for urgent requests (24 hours for Medicare) and up to 15 days for standard requests (30 days for Medicare), please contact [facility] at [phone].

## 2024-04-09 NOTE — Telephone Encounter (Signed)
 Message from Plan PA Case: [id number], Status: Denied, Denial Rationale: Zepbound  12.5MG /0.5ML Solution Auto-Injector is excluded as a plan benefit. Therefore, we are unable to continue processing the prior authorization for the requested medication. Please reference the information in your plan documents or contact your pharmacy for further information. Questions? Contact [phone].  Patient notified via my chart.

## 2024-04-10 ENCOUNTER — Telehealth (INDEPENDENT_AMBULATORY_CARE_PROVIDER_SITE_OTHER): Payer: Self-pay

## 2024-04-10 NOTE — Telephone Encounter (Signed)
 Your request has been denied  PA Case: [id number], Status: Denied, Denial Rationale: Zepbound  12.5MG /0.5ML Solution Auto-Injector is excluded as a plan benefit. Therefore, we are unable to continue processing the prior authorization for the requested medication. Please reference the information in your plan documents or contact your pharmacy for further information. Questions? Contact [phone].

## 2024-05-09 ENCOUNTER — Ambulatory Visit (INDEPENDENT_AMBULATORY_CARE_PROVIDER_SITE_OTHER): Payer: Self-pay | Admitting: Family Medicine
